# Patient Record
Sex: Male | Born: 1960 | Race: White | Hispanic: No | Marital: Single | Smoking: Never smoker
Health system: Southern US, Community
[De-identification: ages and names within clinical notes are randomized; demographics above are authoritative.]

## PROBLEM LIST (undated history)

## (undated) DIAGNOSIS — Z9981 Dependence on supplemental oxygen: Secondary | ICD-10-CM

## (undated) DIAGNOSIS — I10 Essential (primary) hypertension: Secondary | ICD-10-CM

## (undated) DIAGNOSIS — E119 Type 2 diabetes mellitus without complications: Secondary | ICD-10-CM

## (undated) DIAGNOSIS — L0390 Cellulitis, unspecified: Secondary | ICD-10-CM

## (undated) HISTORY — PX: CORONARY ANGIOPLASTY: SHX604

---

## 2003-01-08 ENCOUNTER — Emergency Department (HOSPITAL_COMMUNITY): Admission: EM | Admit: 2003-01-08 | Discharge: 2003-01-08 | Payer: Self-pay | Admitting: Emergency Medicine

## 2003-01-29 ENCOUNTER — Ambulatory Visit (HOSPITAL_COMMUNITY): Admission: RE | Admit: 2003-01-29 | Discharge: 2003-01-30 | Payer: Self-pay | Admitting: Cardiology

## 2003-02-20 ENCOUNTER — Emergency Department (HOSPITAL_COMMUNITY): Admission: EM | Admit: 2003-02-20 | Discharge: 2003-02-20 | Payer: Self-pay | Admitting: Emergency Medicine

## 2004-06-02 ENCOUNTER — Encounter: Admission: RE | Admit: 2004-06-02 | Discharge: 2004-06-23 | Payer: Self-pay | Admitting: Family Medicine

## 2004-11-16 ENCOUNTER — Ambulatory Visit: Payer: Self-pay | Admitting: Family Medicine

## 2005-01-02 ENCOUNTER — Ambulatory Visit: Payer: Self-pay | Admitting: Family Medicine

## 2005-01-06 ENCOUNTER — Ambulatory Visit: Payer: Self-pay | Admitting: Family Medicine

## 2005-02-13 ENCOUNTER — Ambulatory Visit: Payer: Self-pay | Admitting: Family Medicine

## 2005-03-13 ENCOUNTER — Ambulatory Visit: Payer: Self-pay | Admitting: Family Medicine

## 2005-03-31 ENCOUNTER — Ambulatory Visit: Payer: Self-pay | Admitting: Family Medicine

## 2005-04-03 ENCOUNTER — Ambulatory Visit: Payer: Self-pay | Admitting: Family Medicine

## 2005-04-20 ENCOUNTER — Ambulatory Visit: Payer: Self-pay | Admitting: Family Medicine

## 2005-04-25 ENCOUNTER — Ambulatory Visit: Payer: Self-pay | Admitting: Family Medicine

## 2005-05-10 ENCOUNTER — Ambulatory Visit: Payer: Self-pay | Admitting: Family Medicine

## 2005-06-14 ENCOUNTER — Ambulatory Visit: Payer: Self-pay | Admitting: Family Medicine

## 2005-07-17 ENCOUNTER — Ambulatory Visit: Payer: Self-pay | Admitting: Family Medicine

## 2005-08-10 ENCOUNTER — Ambulatory Visit: Payer: Self-pay | Admitting: Family Medicine

## 2005-09-04 ENCOUNTER — Ambulatory Visit: Payer: Self-pay | Admitting: Family Medicine

## 2005-10-09 ENCOUNTER — Ambulatory Visit: Payer: Self-pay | Admitting: Family Medicine

## 2005-10-10 ENCOUNTER — Ambulatory Visit: Payer: Self-pay | Admitting: Family Medicine

## 2005-10-13 ENCOUNTER — Ambulatory Visit: Payer: Self-pay | Admitting: Family Medicine

## 2005-10-19 ENCOUNTER — Ambulatory Visit: Payer: Self-pay | Admitting: Family Medicine

## 2005-10-30 ENCOUNTER — Ambulatory Visit: Payer: Self-pay | Admitting: Family Medicine

## 2005-11-03 ENCOUNTER — Ambulatory Visit: Payer: Self-pay | Admitting: Family Medicine

## 2005-11-28 ENCOUNTER — Ambulatory Visit: Payer: Self-pay | Admitting: Family Medicine

## 2005-12-25 ENCOUNTER — Ambulatory Visit: Payer: Self-pay | Admitting: Family Medicine

## 2006-01-22 ENCOUNTER — Ambulatory Visit: Payer: Self-pay | Admitting: Family Medicine

## 2006-01-29 ENCOUNTER — Ambulatory Visit: Payer: Self-pay | Admitting: Family Medicine

## 2006-02-06 ENCOUNTER — Emergency Department (HOSPITAL_COMMUNITY): Admission: EM | Admit: 2006-02-06 | Discharge: 2006-02-06 | Payer: Self-pay | Admitting: Emergency Medicine

## 2009-04-28 ENCOUNTER — Ambulatory Visit: Admission: RE | Admit: 2009-04-28 | Discharge: 2009-04-28 | Payer: Self-pay | Admitting: Family Medicine

## 2010-04-13 ENCOUNTER — Ambulatory Visit (HOSPITAL_COMMUNITY): Admission: RE | Admit: 2010-04-13 | Discharge: 2010-04-13 | Payer: Self-pay | Admitting: Orthopaedic Surgery

## 2011-01-06 NOTE — H&P (Signed)
[name], [name] NO.:  [id number]   MEDICAL RECORD NO.:  [id number]                  PATIENT TYPE:  OIB   LOCATION:                                       FACILITY:  [facility]   PHYSICIAN:  Dola Argyle, M.D.                  DATE OF BIRTH:  [birth year]   DATE OF ADMISSION:  01/29/2003  DATE OF DISCHARGE:                                HISTORY & PHYSICAL   [name] is a pleasant 50 year old gentleman.  He is markedly obese.  He  has hypertension and diabetes.  Most recently he has developed symptoms of  chest pain and pain across his shoulder blades and down his arms.  At first  there appeared to be a reflux component to this.  The patient received  antireflux medications, but he returned to work and with exertion he had  return of his sensation across his shoulders and into his arms.  With his  multiple risk factors, he is here for further evaluation today and it is  appropriate to proceed with aggressive evaluation.   ALLERGIES:  None known.   MEDICATIONS:  1. Altace 10 mg b.i.d.  2. Norvasc 10 mg daily.  3. Glipizide 5 mg daily.  4. Hyzaar 100/25 mg.  5. Tagamet.   OTHER MEDICAL PROBLEMS:  See the complete list below.   SOCIAL HISTORY:  The patient is single and works at [employer].  He  does not smoke.   FAMILY HISTORY:  Positive for coronary disease.   REVIEW OF SYSTEMS:  The patient has some GERD symptoms.  Otherwise his  review of systems is negative.   PHYSICAL EXAMINATION:  VITAL SIGNS:  Blood pressure is 140/60.  GENERAL:  The patient is markedly obese and weighs greater than  approximately 400 pounds.  CHEST:  Lungs are clear.  NECK:  Normal.  HEENT:  No marked abnormalities.  CARDIAC:  An S1 with an S2 but no clicks or significant murmurs.  ABDOMEN:  Benign.  EXTREMITIES:  He has no peripheral edema.   EKG shows a small Q-wave in lead III.  Labs done in mid-May showed a BUN of  40 and a creatinine of 1.4 and, in  fact, this was somewhat improved from a  prior study.   PROBLEMS:  1. Diabetes.  2. Hypertension.  3. Marked obesity  4. BUN of 42 and a creatinine of 1.4.  5. A Q-wave in lead III.  *6.  Chest pain that may well be ischemic.   The patient has a multitude of risk factors.  Noninvasive testing will not  help.  I have reviewed the situation with Dr. Albertine Patricia, and he will approach  the patient either from his groin or his arm in the catheterization lab on  01/29/03.  The patient is completely in favor.  Ultimately he needs to be on  a statin, and we will be  sure he gets aspirin tonight.                                                Dola Argyle, M.D.    Rockne Coons  D:  01/28/2003  T:  01/28/2003  Job:  [id number]   cc:   Lovie Macadamia on [address] [place],   Trellis Moment, MD  [place]  [place] [address]  Fax: [phone]

## 2011-01-06 NOTE — Cardiovascular Report (Signed)
[name], [name]                       ACCOUNT NO.:  [id number]   MEDICAL RECORD NO.:  [id number]                   PATIENT TYPE:  OIB   LOCATION:  2033                                 FACILITY:  [facility]   PHYSICIAN:  Ethelle Lyon, M.D.             DATE OF BIRTH:  01-May-1961   DATE OF PROCEDURE:  01/29/2003  DATE OF DISCHARGE:                              CARDIAC CATHETERIZATION   PROCEDURE:  1. Left heart catheterization.  2. Left ventriculography.  3. Selective coronary angiography.   INDICATIONS:  The patient is a 50 year old gentleman with hypertension,  diabetes mellitus, and morbid obesity who presents with chest discomfort  occurring both with and without exertion.  He is referred for diagnostic  angiography.   PROCEDURAL TECHNIQUE:  Informed consent was obtained.  Under 1% lidocaine  local anesthesia a 6-French sheath was placed in the right brachial artery  using the modified Seldinger technique and a micro puncture set.  Angiography of the right coronary system was performed using an AL2  catheter.  I was unable to engage the left coronary system using multiple  catheters.  Therefore, left brachial access was obtained using the modified  Seldinger technique under 1% lidocaine local anesthesia.  The left coronary  was then engaged selectively using a JL4 catheter.  Angiography was  performed by hand injection.  Finally, ventriculography was performed in the  RAO projection using a pigtail catheter and power injection.  The patient  tolerated the procedure well.  He was heparinized during the procedure.  Sheaths will be removed when ACT is less than 175 seconds.   COMPLICATIONS:  None.   FINDINGS:   LEFT VENTRICULOGRAPHY:  1. LV 106/7/15.  2. EF 65% without regional wall motion abnormality.  3. No aortic stenosis or mitral regurgitation.   CORONARY ANGIOGRAPHY:  1. Left main:  Angiographically normal.  2. LAD:  The LAD is a large vessel giving rise to  a single diagonal branch.     It is angiographically normal.  3. Ramus intermedius:  Large vessel which is angiographically normal.  4. Circumflex:  Large codominant vessel giving rise to a single obtuse     marginal and the PDA.  It is angiographically normal.  5. RCA:  Moderate sized codominant vessel which is angiographically normal.    IMPRESSION/RECOMMENDATIONS:  The patient has angiographically normal  coronary arteries.  I suspect noncardiac etiology to his chest discomfort.  Furthermore, his left ventricular systolic function is normal and there is  no aortic stenosis or mitral regurgitation.  Recommend continued primary  prevention and further investigation of his chest discomfort by Trellis Moment, MD.                                               Ethelle Lyon, M.D.  WED/MEDQ  D:  01/29/2003  T:  01/30/2003  Job:  [id number]   cc:   Dola Argyle, M.D.   Trellis Moment, MD  [address]  [place] [address]  Fax: [phone]

## 2011-01-06 NOTE — Discharge Summary (Signed)
NAMECLEMENTS, [name]                       ACCOUNT NO.:  [id number]   MEDICAL RECORD NO.:  [id number]                   PATIENT TYPE:  OIB   LOCATION:  2033                                 FACILITY:  [facility]   PHYSICIAN:  Dola Argyle, M.D.                  DATE OF BIRTH:  02-18-1961   DATE OF ADMISSION:  01/29/2003  DATE OF DISCHARGE:  01/30/2003                           DISCHARGE SUMMARY - REFERRING   DISCHARGE DIAGNOSES:  1. Noncardiac chest pain, etiology unclear.  2. Morbid obesity.  3. Hypertension.  4. Diabetes.   PROCEDURES PERFORMED THIS ADMISSION:  Cardiac catheterization by Dr. Sabino Snipes on January 29, 2003, revealing normal coronaries, EF 65%.   HOSPITAL COURSE:  Please see the admission history and physical by Dr.  Dola Argyle on January 29, 2003, for complete details.  Briefly, this 50-year-  old male was admitted on January 29, 2003, with chest pain.  He had recently  had a stress Cardiolite that showed mild inferior ischemia with an EF of  54%.  It was felt that we should proceed with cardiac catheterization.  He  went for cardiac catheterization later the same day; this was performed by  Dr. Albertine Patricia.  The results are noted above.  He tolerated the procedure well  and had no new complications.   On the morning of January 30, 2003, Dr. Harrington Challenger saw the patient, thought he was  stable enough for discharge to home; she suggested more interventional  therapy for possible GERD contributing to his symptoms.  The patient will  also need a fasting lipid panel drawn with his primary care physician.  Aggressive lipid therapy will need to be initiated based upon the ATP-3  guidelines.  Consideration should also be made for checking a homocystine  level in this gentleman.  The patient was counseled about diet and exercise  and weight loss.  He needs more aggressive monitoring of his diabetes as  well.   LABORATORY DATA:  White count 14,700, hemoglobin 12.9, hematocrit 38.2,  platelet count 214,000.  INR 1.  Sodium 136, potassium 4.5, chloride 103,  CO2 of 26, glucose 177, BUN 22, creatinine 1.1, calcium 9.2.  Admission  chest x-ray, normal.   DISCHARGE MEDICATIONS:  1. Protonix 40 mg daily.  2. Altace 10 mg twice daily.  3. Norvasc 10 mg daily.  4. Glipizide 5 mg daily.  5. Hyzaar 100/25 mg daily.   PAIN MANAGEMENT:  Tylenol as needed.   ACTIVITY:  No driving, heavy lifting, exertional work, or sex for two days.   DIET:  Low-fat, low-sodium.   WOUND CARE:  The patient should call the office for swelling, bleeding, or  bruising.   FOLLOW UP:  The patient should see Dr. Mallie Mussel in the next one to two weeks;  he is to call for an appointment.  He will need a fasting lipid panel drawn  by his primary care physician and  homocystine level with aggressive therapy  of his lipids, if warranted.     Richardson Dopp, P.A.                        Dola Argyle, M.D.    SW/MEDQ  D:  01/30/2003  T:  01/30/2003  Job:  [id number]   cc:   Trellis Moment, MD  [place]  [place] [address]  Fax: [phone]   Dola Argyle, M.D.

## 2012-04-11 DIAGNOSIS — M1990 Unspecified osteoarthritis, unspecified site: Secondary | ICD-10-CM | POA: Insufficient documentation

## 2012-09-09 DIAGNOSIS — Z23 Encounter for immunization: Secondary | ICD-10-CM | POA: Insufficient documentation

## 2013-12-22 DIAGNOSIS — R632 Polyphagia: Secondary | ICD-10-CM | POA: Insufficient documentation

## 2014-08-26 DIAGNOSIS — B372 Candidiasis of skin and nail: Secondary | ICD-10-CM | POA: Insufficient documentation

## 2014-08-26 DIAGNOSIS — N183 Chronic kidney disease, stage 3 unspecified: Secondary | ICD-10-CM | POA: Insufficient documentation

## 2014-08-26 DIAGNOSIS — Z794 Long term (current) use of insulin: Secondary | ICD-10-CM | POA: Insufficient documentation

## 2014-12-11 ENCOUNTER — Other Ambulatory Visit (HOSPITAL_COMMUNITY): Payer: Self-pay | Admitting: Nephrology

## 2014-12-11 DIAGNOSIS — N183 Chronic kidney disease, stage 3 unspecified: Secondary | ICD-10-CM

## 2014-12-24 ENCOUNTER — Ambulatory Visit (HOSPITAL_COMMUNITY)
Admission: RE | Admit: 2014-12-24 | Discharge: 2014-12-24 | Disposition: A | Discharge status: Home / Self Care | Payer: Medicare HMO | Source: Ambulatory Visit | Attending: Nephrology | Admitting: Nephrology

## 2014-12-24 DIAGNOSIS — N183 Chronic kidney disease, stage 3 unspecified: Secondary | ICD-10-CM

## 2015-01-28 ENCOUNTER — Encounter (INDEPENDENT_AMBULATORY_CARE_PROVIDER_SITE_OTHER): Payer: Self-pay | Admitting: *Deleted

## 2015-02-01 DIAGNOSIS — K219 Gastro-esophageal reflux disease without esophagitis: Secondary | ICD-10-CM | POA: Insufficient documentation

## 2015-02-01 DIAGNOSIS — E785 Hyperlipidemia, unspecified: Secondary | ICD-10-CM | POA: Insufficient documentation

## 2015-02-01 DIAGNOSIS — E1169 Type 2 diabetes mellitus with other specified complication: Secondary | ICD-10-CM | POA: Insufficient documentation

## 2015-02-01 DIAGNOSIS — D510 Vitamin B12 deficiency anemia due to intrinsic factor deficiency: Secondary | ICD-10-CM | POA: Insufficient documentation

## 2015-02-26 ENCOUNTER — Encounter (HOSPITAL_BASED_OUTPATIENT_CLINIC_OR_DEPARTMENT_OTHER): Payer: Medicare HMO | Attending: Internal Medicine

## 2015-02-26 DIAGNOSIS — I509 Heart failure, unspecified: Secondary | ICD-10-CM | POA: Insufficient documentation

## 2015-02-26 DIAGNOSIS — Z6841 Body Mass Index (BMI) 40.0 and over, adult: Secondary | ICD-10-CM | POA: Diagnosis not present

## 2015-02-26 DIAGNOSIS — L97311 Non-pressure chronic ulcer of right ankle limited to breakdown of skin: Secondary | ICD-10-CM | POA: Insufficient documentation

## 2015-02-26 DIAGNOSIS — L98491 Non-pressure chronic ulcer of skin of other sites limited to breakdown of skin: Secondary | ICD-10-CM | POA: Diagnosis not present

## 2015-02-26 DIAGNOSIS — E039 Hypothyroidism, unspecified: Secondary | ICD-10-CM | POA: Diagnosis not present

## 2015-02-26 DIAGNOSIS — Z794 Long term (current) use of insulin: Secondary | ICD-10-CM | POA: Insufficient documentation

## 2015-02-26 DIAGNOSIS — M1990 Unspecified osteoarthritis, unspecified site: Secondary | ICD-10-CM | POA: Insufficient documentation

## 2015-02-26 DIAGNOSIS — I129 Hypertensive chronic kidney disease with stage 1 through stage 4 chronic kidney disease, or unspecified chronic kidney disease: Secondary | ICD-10-CM | POA: Diagnosis not present

## 2015-02-26 DIAGNOSIS — E1122 Type 2 diabetes mellitus with diabetic chronic kidney disease: Secondary | ICD-10-CM | POA: Insufficient documentation

## 2015-02-26 DIAGNOSIS — E11622 Type 2 diabetes mellitus with other skin ulcer: Secondary | ICD-10-CM | POA: Insufficient documentation

## 2015-02-26 DIAGNOSIS — Z833 Family history of diabetes mellitus: Secondary | ICD-10-CM | POA: Insufficient documentation

## 2015-02-26 DIAGNOSIS — N189 Chronic kidney disease, unspecified: Secondary | ICD-10-CM | POA: Diagnosis not present

## 2015-02-26 DIAGNOSIS — L97111 Non-pressure chronic ulcer of right thigh limited to breakdown of skin: Secondary | ICD-10-CM | POA: Diagnosis not present

## 2015-02-26 DIAGNOSIS — Z87891 Personal history of nicotine dependence: Secondary | ICD-10-CM | POA: Insufficient documentation

## 2015-02-26 DIAGNOSIS — I499 Cardiac arrhythmia, unspecified: Secondary | ICD-10-CM | POA: Diagnosis not present

## 2015-02-26 DIAGNOSIS — G4730 Sleep apnea, unspecified: Secondary | ICD-10-CM | POA: Diagnosis not present

## 2015-02-26 DIAGNOSIS — M109 Gout, unspecified: Secondary | ICD-10-CM | POA: Diagnosis not present

## 2015-03-05 DIAGNOSIS — L98491 Non-pressure chronic ulcer of skin of other sites limited to breakdown of skin: Secondary | ICD-10-CM | POA: Diagnosis not present

## 2015-03-05 DIAGNOSIS — L97111 Non-pressure chronic ulcer of right thigh limited to breakdown of skin: Secondary | ICD-10-CM | POA: Diagnosis not present

## 2015-03-05 DIAGNOSIS — L97311 Non-pressure chronic ulcer of right ankle limited to breakdown of skin: Secondary | ICD-10-CM | POA: Diagnosis not present

## 2015-03-05 DIAGNOSIS — E11622 Type 2 diabetes mellitus with other skin ulcer: Secondary | ICD-10-CM | POA: Diagnosis not present

## 2015-03-08 ENCOUNTER — Ambulatory Visit (INDEPENDENT_AMBULATORY_CARE_PROVIDER_SITE_OTHER): Payer: Medicare HMO | Admitting: Internal Medicine

## 2015-03-11 ENCOUNTER — Encounter (INDEPENDENT_AMBULATORY_CARE_PROVIDER_SITE_OTHER): Payer: Self-pay | Admitting: *Deleted

## 2015-03-12 DIAGNOSIS — L97111 Non-pressure chronic ulcer of right thigh limited to breakdown of skin: Secondary | ICD-10-CM | POA: Diagnosis not present

## 2015-03-12 DIAGNOSIS — L98491 Non-pressure chronic ulcer of skin of other sites limited to breakdown of skin: Secondary | ICD-10-CM | POA: Diagnosis not present

## 2015-03-12 DIAGNOSIS — L97311 Non-pressure chronic ulcer of right ankle limited to breakdown of skin: Secondary | ICD-10-CM | POA: Diagnosis not present

## 2015-03-12 DIAGNOSIS — E11622 Type 2 diabetes mellitus with other skin ulcer: Secondary | ICD-10-CM | POA: Diagnosis not present

## 2015-03-19 DIAGNOSIS — E11622 Type 2 diabetes mellitus with other skin ulcer: Secondary | ICD-10-CM | POA: Diagnosis not present

## 2015-03-19 DIAGNOSIS — L97311 Non-pressure chronic ulcer of right ankle limited to breakdown of skin: Secondary | ICD-10-CM | POA: Diagnosis not present

## 2015-03-19 DIAGNOSIS — L97111 Non-pressure chronic ulcer of right thigh limited to breakdown of skin: Secondary | ICD-10-CM | POA: Diagnosis not present

## 2015-03-19 DIAGNOSIS — L98491 Non-pressure chronic ulcer of skin of other sites limited to breakdown of skin: Secondary | ICD-10-CM | POA: Diagnosis not present

## 2015-03-26 ENCOUNTER — Encounter (HOSPITAL_BASED_OUTPATIENT_CLINIC_OR_DEPARTMENT_OTHER): Payer: Medicare HMO | Attending: Internal Medicine

## 2015-03-26 DIAGNOSIS — M1990 Unspecified osteoarthritis, unspecified site: Secondary | ICD-10-CM | POA: Insufficient documentation

## 2015-03-26 DIAGNOSIS — L97111 Non-pressure chronic ulcer of right thigh limited to breakdown of skin: Secondary | ICD-10-CM | POA: Insufficient documentation

## 2015-03-26 DIAGNOSIS — M109 Gout, unspecified: Secondary | ICD-10-CM | POA: Diagnosis not present

## 2015-03-26 DIAGNOSIS — G4730 Sleep apnea, unspecified: Secondary | ICD-10-CM | POA: Insufficient documentation

## 2015-03-26 DIAGNOSIS — Z6841 Body Mass Index (BMI) 40.0 and over, adult: Secondary | ICD-10-CM | POA: Insufficient documentation

## 2015-03-26 DIAGNOSIS — L97311 Non-pressure chronic ulcer of right ankle limited to breakdown of skin: Secondary | ICD-10-CM | POA: Diagnosis not present

## 2015-03-26 DIAGNOSIS — Z8614 Personal history of Methicillin resistant Staphylococcus aureus infection: Secondary | ICD-10-CM | POA: Diagnosis not present

## 2015-03-26 DIAGNOSIS — E1140 Type 2 diabetes mellitus with diabetic neuropathy, unspecified: Secondary | ICD-10-CM | POA: Diagnosis not present

## 2015-03-26 DIAGNOSIS — L98491 Non-pressure chronic ulcer of skin of other sites limited to breakdown of skin: Secondary | ICD-10-CM | POA: Diagnosis not present

## 2015-03-26 DIAGNOSIS — E11622 Type 2 diabetes mellitus with other skin ulcer: Secondary | ICD-10-CM | POA: Diagnosis not present

## 2015-03-26 DIAGNOSIS — I509 Heart failure, unspecified: Secondary | ICD-10-CM | POA: Diagnosis not present

## 2015-03-26 DIAGNOSIS — I499 Cardiac arrhythmia, unspecified: Secondary | ICD-10-CM | POA: Diagnosis not present

## 2015-04-02 DIAGNOSIS — L97111 Non-pressure chronic ulcer of right thigh limited to breakdown of skin: Secondary | ICD-10-CM | POA: Diagnosis not present

## 2015-04-02 DIAGNOSIS — L98491 Non-pressure chronic ulcer of skin of other sites limited to breakdown of skin: Secondary | ICD-10-CM | POA: Diagnosis not present

## 2015-04-02 DIAGNOSIS — L97311 Non-pressure chronic ulcer of right ankle limited to breakdown of skin: Secondary | ICD-10-CM | POA: Diagnosis not present

## 2015-04-02 DIAGNOSIS — E11622 Type 2 diabetes mellitus with other skin ulcer: Secondary | ICD-10-CM | POA: Diagnosis not present

## 2015-04-06 ENCOUNTER — Ambulatory Visit (INDEPENDENT_AMBULATORY_CARE_PROVIDER_SITE_OTHER): Payer: Medicare HMO | Admitting: Internal Medicine

## 2015-04-09 DIAGNOSIS — E11622 Type 2 diabetes mellitus with other skin ulcer: Secondary | ICD-10-CM | POA: Diagnosis not present

## 2015-04-09 DIAGNOSIS — L97111 Non-pressure chronic ulcer of right thigh limited to breakdown of skin: Secondary | ICD-10-CM | POA: Diagnosis not present

## 2015-04-09 DIAGNOSIS — L98491 Non-pressure chronic ulcer of skin of other sites limited to breakdown of skin: Secondary | ICD-10-CM | POA: Diagnosis not present

## 2015-04-09 DIAGNOSIS — L97311 Non-pressure chronic ulcer of right ankle limited to breakdown of skin: Secondary | ICD-10-CM | POA: Diagnosis not present

## 2015-04-12 ENCOUNTER — Ambulatory Visit (INDEPENDENT_AMBULATORY_CARE_PROVIDER_SITE_OTHER): Payer: Medicare HMO | Admitting: Internal Medicine

## 2015-04-23 ENCOUNTER — Encounter (HOSPITAL_BASED_OUTPATIENT_CLINIC_OR_DEPARTMENT_OTHER): Payer: Medicare HMO | Attending: Internal Medicine

## 2015-04-23 DIAGNOSIS — M109 Gout, unspecified: Secondary | ICD-10-CM | POA: Diagnosis not present

## 2015-04-23 DIAGNOSIS — I509 Heart failure, unspecified: Secondary | ICD-10-CM | POA: Insufficient documentation

## 2015-04-23 DIAGNOSIS — E1140 Type 2 diabetes mellitus with diabetic neuropathy, unspecified: Secondary | ICD-10-CM | POA: Diagnosis not present

## 2015-04-23 DIAGNOSIS — I10 Essential (primary) hypertension: Secondary | ICD-10-CM | POA: Diagnosis not present

## 2015-04-23 DIAGNOSIS — L97111 Non-pressure chronic ulcer of right thigh limited to breakdown of skin: Secondary | ICD-10-CM | POA: Diagnosis not present

## 2015-04-23 DIAGNOSIS — L98491 Non-pressure chronic ulcer of skin of other sites limited to breakdown of skin: Secondary | ICD-10-CM | POA: Insufficient documentation

## 2015-04-23 DIAGNOSIS — I499 Cardiac arrhythmia, unspecified: Secondary | ICD-10-CM | POA: Diagnosis not present

## 2015-04-23 DIAGNOSIS — G4730 Sleep apnea, unspecified: Secondary | ICD-10-CM | POA: Insufficient documentation

## 2015-04-23 DIAGNOSIS — L97311 Non-pressure chronic ulcer of right ankle limited to breakdown of skin: Secondary | ICD-10-CM | POA: Diagnosis not present

## 2015-04-23 DIAGNOSIS — E11622 Type 2 diabetes mellitus with other skin ulcer: Secondary | ICD-10-CM | POA: Diagnosis present

## 2015-04-23 DIAGNOSIS — M1990 Unspecified osteoarthritis, unspecified site: Secondary | ICD-10-CM | POA: Diagnosis not present

## 2015-05-07 DIAGNOSIS — L98491 Non-pressure chronic ulcer of skin of other sites limited to breakdown of skin: Secondary | ICD-10-CM | POA: Diagnosis not present

## 2015-05-07 DIAGNOSIS — L97311 Non-pressure chronic ulcer of right ankle limited to breakdown of skin: Secondary | ICD-10-CM | POA: Diagnosis not present

## 2015-05-07 DIAGNOSIS — L97111 Non-pressure chronic ulcer of right thigh limited to breakdown of skin: Secondary | ICD-10-CM | POA: Diagnosis not present

## 2015-05-07 DIAGNOSIS — E11622 Type 2 diabetes mellitus with other skin ulcer: Secondary | ICD-10-CM | POA: Diagnosis not present

## 2015-05-28 ENCOUNTER — Encounter (HOSPITAL_BASED_OUTPATIENT_CLINIC_OR_DEPARTMENT_OTHER): Payer: Medicare HMO | Attending: Internal Medicine

## 2015-05-28 DIAGNOSIS — I110 Hypertensive heart disease with heart failure: Secondary | ICD-10-CM | POA: Insufficient documentation

## 2015-05-28 DIAGNOSIS — M109 Gout, unspecified: Secondary | ICD-10-CM | POA: Insufficient documentation

## 2015-05-28 DIAGNOSIS — M1990 Unspecified osteoarthritis, unspecified site: Secondary | ICD-10-CM | POA: Diagnosis not present

## 2015-05-28 DIAGNOSIS — L98491 Non-pressure chronic ulcer of skin of other sites limited to breakdown of skin: Secondary | ICD-10-CM | POA: Insufficient documentation

## 2015-05-28 DIAGNOSIS — I509 Heart failure, unspecified: Secondary | ICD-10-CM | POA: Insufficient documentation

## 2015-05-28 DIAGNOSIS — F418 Other specified anxiety disorders: Secondary | ICD-10-CM | POA: Insufficient documentation

## 2015-05-28 DIAGNOSIS — E11622 Type 2 diabetes mellitus with other skin ulcer: Secondary | ICD-10-CM | POA: Diagnosis not present

## 2015-05-28 DIAGNOSIS — G4730 Sleep apnea, unspecified: Secondary | ICD-10-CM | POA: Insufficient documentation

## 2015-05-28 DIAGNOSIS — L97311 Non-pressure chronic ulcer of right ankle limited to breakdown of skin: Secondary | ICD-10-CM | POA: Diagnosis not present

## 2015-05-28 DIAGNOSIS — L97111 Non-pressure chronic ulcer of right thigh limited to breakdown of skin: Secondary | ICD-10-CM | POA: Insufficient documentation

## 2015-05-28 DIAGNOSIS — Z6841 Body Mass Index (BMI) 40.0 and over, adult: Secondary | ICD-10-CM | POA: Insufficient documentation

## 2015-06-25 ENCOUNTER — Encounter (HOSPITAL_BASED_OUTPATIENT_CLINIC_OR_DEPARTMENT_OTHER): Payer: Medicare HMO | Attending: Internal Medicine

## 2015-08-24 DIAGNOSIS — E559 Vitamin D deficiency, unspecified: Secondary | ICD-10-CM | POA: Insufficient documentation

## 2015-08-24 DIAGNOSIS — M793 Panniculitis, unspecified: Secondary | ICD-10-CM | POA: Insufficient documentation

## 2015-09-14 ENCOUNTER — Ambulatory Visit (HOSPITAL_BASED_OUTPATIENT_CLINIC_OR_DEPARTMENT_OTHER): Payer: Medicare HMO

## 2015-09-30 ENCOUNTER — Encounter (HOSPITAL_BASED_OUTPATIENT_CLINIC_OR_DEPARTMENT_OTHER): Payer: Medicare HMO | Attending: Internal Medicine

## 2015-09-30 DIAGNOSIS — L97811 Non-pressure chronic ulcer of other part of right lower leg limited to breakdown of skin: Secondary | ICD-10-CM | POA: Insufficient documentation

## 2015-09-30 DIAGNOSIS — Z6841 Body Mass Index (BMI) 40.0 and over, adult: Secondary | ICD-10-CM | POA: Diagnosis not present

## 2015-09-30 DIAGNOSIS — L97111 Non-pressure chronic ulcer of right thigh limited to breakdown of skin: Secondary | ICD-10-CM | POA: Insufficient documentation

## 2015-09-30 DIAGNOSIS — G4730 Sleep apnea, unspecified: Secondary | ICD-10-CM | POA: Diagnosis not present

## 2015-09-30 DIAGNOSIS — M109 Gout, unspecified: Secondary | ICD-10-CM | POA: Diagnosis not present

## 2015-09-30 DIAGNOSIS — E1140 Type 2 diabetes mellitus with diabetic neuropathy, unspecified: Secondary | ICD-10-CM | POA: Insufficient documentation

## 2015-09-30 DIAGNOSIS — I509 Heart failure, unspecified: Secondary | ICD-10-CM | POA: Diagnosis not present

## 2015-09-30 DIAGNOSIS — X58XXXA Exposure to other specified factors, initial encounter: Secondary | ICD-10-CM | POA: Insufficient documentation

## 2015-09-30 DIAGNOSIS — I87331 Chronic venous hypertension (idiopathic) with ulcer and inflammation of right lower extremity: Secondary | ICD-10-CM | POA: Insufficient documentation

## 2015-09-30 DIAGNOSIS — I110 Hypertensive heart disease with heart failure: Secondary | ICD-10-CM | POA: Insufficient documentation

## 2015-09-30 DIAGNOSIS — S31103A Unspecified open wound of abdominal wall, right lower quadrant without penetration into peritoneal cavity, initial encounter: Secondary | ICD-10-CM | POA: Insufficient documentation

## 2015-09-30 DIAGNOSIS — M1990 Unspecified osteoarthritis, unspecified site: Secondary | ICD-10-CM | POA: Diagnosis not present

## 2015-09-30 DIAGNOSIS — I890 Lymphedema, not elsewhere classified: Secondary | ICD-10-CM | POA: Insufficient documentation

## 2015-10-06 ENCOUNTER — Ambulatory Visit (INDEPENDENT_AMBULATORY_CARE_PROVIDER_SITE_OTHER): Payer: Medicare HMO | Admitting: Orthopaedic Surgery

## 2015-10-06 ENCOUNTER — Encounter: Payer: Self-pay | Admitting: Orthopaedic Surgery

## 2015-10-06 DIAGNOSIS — M25561 Pain in right knee: Secondary | ICD-10-CM | POA: Diagnosis not present

## 2015-10-06 DIAGNOSIS — M25562 Pain in left knee: Secondary | ICD-10-CM

## 2015-10-06 MED ORDER — OXYCODONE HCL 15 MG PO TABS: ORAL_TABLET | ORAL | Status: DC

## 2015-10-06 NOTE — Patient Instructions (Signed)
Call if any problems

## 2015-10-06 NOTE — Progress Notes (Signed)
Chief complaint is my knees hurt.  He is here for injections of both knees.  He has no new trauma.  He is on supplemental oxygen.  The patient request injection, verbal consent was obtained.  The right knee was prepped appropriately after time out was performed.   Sterile technique was observed and injection of 1 cc of Depo-Medrol 40 mg with several cc's of plain xylocaine. Anesthesia was provided by ethyl chloride and a 20-gauge needle was used to inject the knee area. The injection was tolerated well.  A band aid dressing was applied.  The patient was advised to apply ice later today and tomorrow to the injection sight as needed.  The patient request injection, verbal consent was obtained.  The left knee was prepped appropriately after time out was performed.   Sterile technique was observed and injection of 1 cc of Depo-Medrol 40 mg with several cc's of plain xylocaine. Anesthesia was provided by ethyl chloride and a 20-gauge needle was used to inject the knee area. The injection was tolerated well.  A band aid dressing was applied.  The patient was advised to apply ice later today and tomorrow to the injection sight as needed.  His pain medicine was refilled.  I will see him in one month.  Call if any problem.

## 2015-10-07 DIAGNOSIS — I87331 Chronic venous hypertension (idiopathic) with ulcer and inflammation of right lower extremity: Secondary | ICD-10-CM | POA: Diagnosis not present

## 2015-10-14 DIAGNOSIS — I87331 Chronic venous hypertension (idiopathic) with ulcer and inflammation of right lower extremity: Secondary | ICD-10-CM | POA: Diagnosis not present

## 2015-10-21 ENCOUNTER — Encounter (HOSPITAL_BASED_OUTPATIENT_CLINIC_OR_DEPARTMENT_OTHER): Payer: Medicare HMO | Attending: Internal Medicine

## 2015-10-21 DIAGNOSIS — E1140 Type 2 diabetes mellitus with diabetic neuropathy, unspecified: Secondary | ICD-10-CM | POA: Insufficient documentation

## 2015-10-21 DIAGNOSIS — S31103A Unspecified open wound of abdominal wall, right lower quadrant without penetration into peritoneal cavity, initial encounter: Secondary | ICD-10-CM | POA: Diagnosis not present

## 2015-10-21 DIAGNOSIS — I110 Hypertensive heart disease with heart failure: Secondary | ICD-10-CM | POA: Insufficient documentation

## 2015-10-21 DIAGNOSIS — M1990 Unspecified osteoarthritis, unspecified site: Secondary | ICD-10-CM | POA: Insufficient documentation

## 2015-10-21 DIAGNOSIS — L97812 Non-pressure chronic ulcer of other part of right lower leg with fat layer exposed: Secondary | ICD-10-CM | POA: Insufficient documentation

## 2015-10-21 DIAGNOSIS — L97111 Non-pressure chronic ulcer of right thigh limited to breakdown of skin: Secondary | ICD-10-CM | POA: Insufficient documentation

## 2015-10-21 DIAGNOSIS — I509 Heart failure, unspecified: Secondary | ICD-10-CM | POA: Diagnosis not present

## 2015-10-21 DIAGNOSIS — X58XXXA Exposure to other specified factors, initial encounter: Secondary | ICD-10-CM | POA: Diagnosis not present

## 2015-10-21 DIAGNOSIS — G4730 Sleep apnea, unspecified: Secondary | ICD-10-CM | POA: Diagnosis not present

## 2015-10-21 DIAGNOSIS — I87331 Chronic venous hypertension (idiopathic) with ulcer and inflammation of right lower extremity: Secondary | ICD-10-CM | POA: Diagnosis not present

## 2015-10-21 DIAGNOSIS — I890 Lymphedema, not elsewhere classified: Secondary | ICD-10-CM | POA: Insufficient documentation

## 2015-10-21 DIAGNOSIS — Z6841 Body Mass Index (BMI) 40.0 and over, adult: Secondary | ICD-10-CM | POA: Diagnosis not present

## 2015-10-21 DIAGNOSIS — L98491 Non-pressure chronic ulcer of skin of other sites limited to breakdown of skin: Secondary | ICD-10-CM | POA: Insufficient documentation

## 2015-10-27 ENCOUNTER — Telehealth: Payer: Self-pay | Admitting: Orthopaedic Surgery

## 2015-10-27 MED ORDER — OXYCODONE HCL 15 MG PO TABS: ORAL_TABLET | ORAL | Status: DC

## 2015-10-27 NOTE — Telephone Encounter (Signed)
Patient called and requested a refill on Oxycodone 15 mgs.  Qty 160.  Patient states it is due to be refilled on 10-30-15.

## 2015-10-27 NOTE — Telephone Encounter (Signed)
Rx done. 

## 2015-11-10 ENCOUNTER — Ambulatory Visit (INDEPENDENT_AMBULATORY_CARE_PROVIDER_SITE_OTHER): Payer: Medicare HMO | Admitting: Orthopaedic Surgery

## 2015-11-10 DIAGNOSIS — M25561 Pain in right knee: Secondary | ICD-10-CM | POA: Diagnosis not present

## 2015-11-10 DIAGNOSIS — M25562 Pain in left knee: Secondary | ICD-10-CM

## 2015-11-10 MED ORDER — OXYCODONE HCL 15 MG PO TABS: ORAL_TABLET | ORAL | Status: DC

## 2015-11-10 NOTE — Progress Notes (Signed)
CC: Both of my knees are hurting. I would like an injection in both knees.  The patient has had chronic pain and tenderness of both knees for some time.  Injections help.  There is no locking or giving way of the knee.  There is no new trauma. There is no redness or signs of infections.  The knees have a mild effusion and some crepitus.  There is no redness or signs of recent trauma.  Impression:  Chronic pain of the both knees  Return:  One month.  While here he became very short of breath.  He has portable oxygen.  He said he could not walk back to the car.  We used the room table as a stretcher as it has retractable wheels on the table.  We wheeled him out to the lobby.  He was able to get in his car.  PROCEDURE NOTE:  The patient request injection, verbal consent was obtained.  The bilateral knees were prepped appropriately after time out was performed.   Sterile technique was observed and injection of 1 cc of Depo-Medrol 40 mg with several cc's of plain xylocaine. Anesthesia was provided by ethyl chloride and a 20-gauge needle was used to inject the knee area. The injection was tolerated well.  Both knees were injected similarly.  A band aid dressing was applied.  The patient was advised to apply ice later today and tomorrow to the injection sight as needed.

## 2015-11-22 DIAGNOSIS — D509 Iron deficiency anemia, unspecified: Secondary | ICD-10-CM | POA: Insufficient documentation

## 2015-11-22 DIAGNOSIS — E039 Hypothyroidism, unspecified: Secondary | ICD-10-CM | POA: Insufficient documentation

## 2015-11-25 ENCOUNTER — Encounter (HOSPITAL_BASED_OUTPATIENT_CLINIC_OR_DEPARTMENT_OTHER): Payer: Medicare HMO

## 2015-12-09 ENCOUNTER — Ambulatory Visit (INDEPENDENT_AMBULATORY_CARE_PROVIDER_SITE_OTHER): Payer: Medicare HMO | Admitting: Orthopaedic Surgery

## 2015-12-09 ENCOUNTER — Encounter (HOSPITAL_BASED_OUTPATIENT_CLINIC_OR_DEPARTMENT_OTHER): Payer: Medicare HMO | Attending: Internal Medicine

## 2015-12-09 DIAGNOSIS — M25562 Pain in left knee: Secondary | ICD-10-CM | POA: Diagnosis not present

## 2015-12-09 DIAGNOSIS — M25561 Pain in right knee: Secondary | ICD-10-CM

## 2015-12-09 MED ORDER — OXYCODONE HCL 15 MG PO TABS: ORAL_TABLET | ORAL | Status: DC

## 2015-12-09 NOTE — Progress Notes (Signed)
Patient ID: [name], male   DOB: 11/08/1960, 55 y.o.   MRN: [id number]  CC: Both of my knees are hurting. I would like an injection in both knees.  The patient has had chronic pain and tenderness of both knees for some time.  Injections help.  There is no locking or giving way of the knee.  There is no new trauma. There is no redness or signs of infections.  The knees have a mild effusion and some crepitus.  There is no redness or signs of recent trauma.  Impression:  Chronic pain of the both knees  Return:  One month  PROCEDURE NOTE:  The patient requests injections of the left knee , verbal consent was obtained.  The left knee was prepped appropriately after time out was performed.   Sterile technique was observed and injection of 1 cc of Depo-Medrol 40 mg with several cc's of plain xylocaine. Anesthesia was provided by ethyl chloride and a 20-gauge needle was used to inject the knee area. The injection was tolerated well.  A band aid dressing was applied.  The patient was advised to apply ice later today and tomorrow to the injection sight as needed.  PROCEDURE NOTE:  The patient requests injections of the right knee , verbal consent was obtained.  The right knee was prepped appropriately after time out was performed.   Sterile technique was observed and injection of 1 cc of Depo-Medrol 40 mg with several cc's of plain xylocaine. Anesthesia was provided by ethyl chloride and a 20-gauge needle was used to inject the knee area. The injection was tolerated well.  A band aid dressing was applied.  The patient was advised to apply ice later today and tomorrow to the injection sight as needed.

## 2015-12-30 ENCOUNTER — Encounter (HOSPITAL_BASED_OUTPATIENT_CLINIC_OR_DEPARTMENT_OTHER): Payer: Medicare HMO

## 2016-01-06 ENCOUNTER — Ambulatory Visit (INDEPENDENT_AMBULATORY_CARE_PROVIDER_SITE_OTHER): Payer: Medicare HMO | Admitting: Orthopaedic Surgery

## 2016-01-06 ENCOUNTER — Encounter: Payer: Self-pay | Admitting: Orthopaedic Surgery

## 2016-01-06 DIAGNOSIS — M25561 Pain in right knee: Secondary | ICD-10-CM | POA: Diagnosis not present

## 2016-01-06 DIAGNOSIS — M25562 Pain in left knee: Secondary | ICD-10-CM | POA: Diagnosis not present

## 2016-01-06 MED ORDER — OXYCODONE HCL 15 MG PO TABS: ORAL_TABLET | ORAL | Status: DC

## 2016-01-06 NOTE — Progress Notes (Signed)
CC: Both of my knees are hurting. I would like an injection in both knees.  The patient has had chronic pain and tenderness of both knees for some time.  Injections help.  There is no locking or giving way of the knee.  There is no new trauma. There is no redness or signs of infections.  The knees have a mild effusion and some crepitus.  There is no redness or signs of recent trauma.  Impression:  Chronic pain of the both knees  Return:  One month  PROCEDURE NOTE:  The patient requests injections of the left knee , verbal consent was obtained.  The left knee was prepped appropriately after time out was performed.   Sterile technique was observed and injection of 1 cc of Depo-Medrol 40 mg with several cc's of plain xylocaine. Anesthesia was provided by ethyl chloride and a 20-gauge needle was used to inject the knee area. The injection was tolerated well.  A band aid dressing was applied.  The patient was advised to apply ice later today and tomorrow to the injection sight as needed.  PROCEDURE NOTE:  The patient requests injections of the right knee , verbal consent was obtained.  The right knee was prepped appropriately after time out was performed.   Sterile technique was observed and injection of 1 cc of Depo-Medrol 40 mg with several cc's of plain xylocaine. Anesthesia was provided by ethyl chloride and a 20-gauge needle was used to inject the knee area. The injection was tolerated well.  A band aid dressing was applied.  The patient was advised to apply ice later today and tomorrow to the injection sight as needed.

## 2016-01-08 ENCOUNTER — Telehealth: Payer: Self-pay | Admitting: Orthopaedic Surgery

## 2016-01-10 NOTE — Telephone Encounter (Signed)
Rx done. 

## 2016-02-03 ENCOUNTER — Ambulatory Visit (INDEPENDENT_AMBULATORY_CARE_PROVIDER_SITE_OTHER): Payer: Medicare HMO | Admitting: Orthopaedic Surgery

## 2016-02-03 ENCOUNTER — Encounter: Payer: Self-pay | Admitting: Orthopaedic Surgery

## 2016-02-03 DIAGNOSIS — M25562 Pain in left knee: Secondary | ICD-10-CM

## 2016-02-03 DIAGNOSIS — M25561 Pain in right knee: Secondary | ICD-10-CM | POA: Diagnosis not present

## 2016-02-03 MED ORDER — OXYCODONE HCL 15 MG PO TABS: ORAL_TABLET | ORAL | Status: DC

## 2016-02-03 NOTE — Progress Notes (Signed)
CC: Both of my knees are hurting. I would like an injection in both knees.  The patient has had chronic pain and tenderness of both knees for some time.  Injections help.  There is no locking or giving way of the knee.  There is no new trauma. There is no redness or signs of infections.  The knees have a mild effusion and some crepitus.  There is no redness or signs of recent trauma.  Impression:  Chronic pain of the both knees  Return:  One month  PROCEDURE NOTE:  The patient requests injections of the left knee , verbal consent was obtained.  The left knee was prepped appropriately after time out was performed.   Sterile technique was observed and injection of 1 cc of Depo-Medrol 40 mg with several cc's of plain xylocaine. Anesthesia was provided by ethyl chloride and a 20-gauge needle was used to inject the knee area. The injection was tolerated well.  A band aid dressing was applied.  The patient was advised to apply ice later today and tomorrow to the injection sight as needed.  PROCEDURE NOTE:  The patient requests injections of the right knee , verbal consent was obtained.  The right knee was prepped appropriately after time out was performed.   Sterile technique was observed and injection of 1 cc of Depo-Medrol 40 mg with several cc's of plain xylocaine. Anesthesia was provided by ethyl chloride and a 20-gauge needle was used to inject the knee area. The injection was tolerated well.  A band aid dressing was applied.  The patient was advised to apply ice later today and tomorrow to the injection sight as needed.  Electronically Signed Sanjuana Kava, MD 6/15/20179:59 AM

## 2016-03-02 ENCOUNTER — Encounter: Payer: Self-pay | Admitting: Orthopaedic Surgery

## 2016-03-02 ENCOUNTER — Ambulatory Visit (INDEPENDENT_AMBULATORY_CARE_PROVIDER_SITE_OTHER): Payer: Medicare HMO | Admitting: Orthopaedic Surgery

## 2016-03-02 DIAGNOSIS — M25561 Pain in right knee: Secondary | ICD-10-CM

## 2016-03-02 DIAGNOSIS — M25562 Pain in left knee: Secondary | ICD-10-CM

## 2016-03-02 MED ORDER — OXYCODONE HCL 15 MG PO TABS: ORAL_TABLET | ORAL | Status: DC

## 2016-03-02 NOTE — Progress Notes (Signed)
CC: Both of my knees are hurting. I would like an injection in both knees.  The patient has had chronic pain and tenderness of both knees for some time.  Injections help.  There is no locking or giving way of the knee.  There is no new trauma. There is no redness or signs of infections.  The knees have a mild effusion and some crepitus.  There is no redness or signs of recent trauma.  Right knee ROM is 0-90 and left knee ROM is 0-90.  Impression:  Chronic pain of the both knees  Return:  1 month  PROCEDURE NOTE:  The patient requests injections of both knees, verbal consent was obtained.  The left and right knee were individually prepped appropriately after time out was performed.   Sterile technique was observed and injection of 1 cc of Depo-Medrol 40 mg with several cc's of plain xylocaine. Anesthesia was provided by ethyl chloride and a 20-gauge needle was used to inject each knee area. The injections were tolerated well.  A band aid dressing was applied.  The patient was advised to apply ice later today and tomorrow to the injection sight as needed.  Electronically Signed Sanjuana Kava, MD 7/13/201710:46 AM

## 2016-03-24 ENCOUNTER — Encounter (HOSPITAL_BASED_OUTPATIENT_CLINIC_OR_DEPARTMENT_OTHER): Payer: Medicare HMO | Attending: Internal Medicine

## 2016-03-24 DIAGNOSIS — I87331 Chronic venous hypertension (idiopathic) with ulcer and inflammation of right lower extremity: Secondary | ICD-10-CM | POA: Diagnosis not present

## 2016-03-24 DIAGNOSIS — Z87891 Personal history of nicotine dependence: Secondary | ICD-10-CM | POA: Diagnosis not present

## 2016-03-24 DIAGNOSIS — G4730 Sleep apnea, unspecified: Secondary | ICD-10-CM | POA: Diagnosis not present

## 2016-03-24 DIAGNOSIS — Z6841 Body Mass Index (BMI) 40.0 and over, adult: Secondary | ICD-10-CM | POA: Diagnosis not present

## 2016-03-24 DIAGNOSIS — E11622 Type 2 diabetes mellitus with other skin ulcer: Secondary | ICD-10-CM | POA: Diagnosis not present

## 2016-03-24 DIAGNOSIS — E1122 Type 2 diabetes mellitus with diabetic chronic kidney disease: Secondary | ICD-10-CM | POA: Diagnosis not present

## 2016-03-24 DIAGNOSIS — N189 Chronic kidney disease, unspecified: Secondary | ICD-10-CM | POA: Insufficient documentation

## 2016-03-24 DIAGNOSIS — I130 Hypertensive heart and chronic kidney disease with heart failure and stage 1 through stage 4 chronic kidney disease, or unspecified chronic kidney disease: Secondary | ICD-10-CM | POA: Diagnosis not present

## 2016-03-24 DIAGNOSIS — I509 Heart failure, unspecified: Secondary | ICD-10-CM | POA: Diagnosis not present

## 2016-03-24 DIAGNOSIS — L97811 Non-pressure chronic ulcer of other part of right lower leg limited to breakdown of skin: Secondary | ICD-10-CM | POA: Diagnosis not present

## 2016-03-24 DIAGNOSIS — E1140 Type 2 diabetes mellitus with diabetic neuropathy, unspecified: Secondary | ICD-10-CM | POA: Insufficient documentation

## 2016-03-24 DIAGNOSIS — M1990 Unspecified osteoarthritis, unspecified site: Secondary | ICD-10-CM | POA: Insufficient documentation

## 2016-03-30 ENCOUNTER — Ambulatory Visit (INDEPENDENT_AMBULATORY_CARE_PROVIDER_SITE_OTHER): Payer: Medicare HMO | Admitting: Orthopaedic Surgery

## 2016-03-30 DIAGNOSIS — M25561 Pain in right knee: Secondary | ICD-10-CM

## 2016-03-30 DIAGNOSIS — M25562 Pain in left knee: Secondary | ICD-10-CM

## 2016-03-30 MED ORDER — OXYCODONE HCL 15 MG PO TABS: ORAL_TABLET | ORAL | 0 refills | Status: DC

## 2016-03-30 NOTE — Progress Notes (Signed)
CC: Both of my knees are hurting. I would like an injection in both knees.  The patient has had chronic pain and tenderness of both knees for some time.  Injections help.  There is no locking or giving way of the knee.  There is no new trauma. There is no redness or signs of infections.  The knees have a mild effusion and some crepitus.  There is no redness or signs of recent trauma.  Right knee ROM is 0-90 and left knee ROM is 0-95.  Impression:  Chronic pain of the both knees  Return:  1 month  PROCEDURE NOTE:  The patient requests injections of both knees, verbal consent was obtained.  The left and right knee were individually prepped appropriately after time out was performed.   Sterile technique was observed and injection of 1 cc of Depo-Medrol 40 mg with several cc's of plain xylocaine. Anesthesia was provided by ethyl chloride and a 20-gauge needle was used to inject each knee area. The injections were tolerated well.  A band aid dressing was applied.  The patient was advised to apply ice later today and tomorrow to the injection sight as needed.   Electronically Signed Sanjuana Kava, MD 8/10/20178:57 AM

## 2016-03-31 DIAGNOSIS — E11622 Type 2 diabetes mellitus with other skin ulcer: Secondary | ICD-10-CM | POA: Diagnosis not present

## 2016-04-07 DIAGNOSIS — E11622 Type 2 diabetes mellitus with other skin ulcer: Secondary | ICD-10-CM | POA: Diagnosis not present

## 2016-04-10 DIAGNOSIS — F411 Generalized anxiety disorder: Secondary | ICD-10-CM | POA: Insufficient documentation

## 2016-04-21 ENCOUNTER — Encounter (HOSPITAL_BASED_OUTPATIENT_CLINIC_OR_DEPARTMENT_OTHER): Payer: Medicare HMO | Attending: Internal Medicine

## 2016-04-21 DIAGNOSIS — E11622 Type 2 diabetes mellitus with other skin ulcer: Secondary | ICD-10-CM | POA: Insufficient documentation

## 2016-04-21 DIAGNOSIS — M109 Gout, unspecified: Secondary | ICD-10-CM | POA: Insufficient documentation

## 2016-04-21 DIAGNOSIS — X58XXXA Exposure to other specified factors, initial encounter: Secondary | ICD-10-CM | POA: Insufficient documentation

## 2016-04-21 DIAGNOSIS — E1151 Type 2 diabetes mellitus with diabetic peripheral angiopathy without gangrene: Secondary | ICD-10-CM | POA: Insufficient documentation

## 2016-04-21 DIAGNOSIS — I110 Hypertensive heart disease with heart failure: Secondary | ICD-10-CM | POA: Insufficient documentation

## 2016-04-21 DIAGNOSIS — S31103A Unspecified open wound of abdominal wall, right lower quadrant without penetration into peritoneal cavity, initial encounter: Secondary | ICD-10-CM | POA: Insufficient documentation

## 2016-04-21 DIAGNOSIS — G4730 Sleep apnea, unspecified: Secondary | ICD-10-CM | POA: Insufficient documentation

## 2016-04-21 DIAGNOSIS — I509 Heart failure, unspecified: Secondary | ICD-10-CM | POA: Diagnosis not present

## 2016-04-21 DIAGNOSIS — E1140 Type 2 diabetes mellitus with diabetic neuropathy, unspecified: Secondary | ICD-10-CM | POA: Diagnosis not present

## 2016-04-21 DIAGNOSIS — Z6841 Body Mass Index (BMI) 40.0 and over, adult: Secondary | ICD-10-CM | POA: Insufficient documentation

## 2016-04-21 DIAGNOSIS — I87331 Chronic venous hypertension (idiopathic) with ulcer and inflammation of right lower extremity: Secondary | ICD-10-CM | POA: Diagnosis not present

## 2016-04-21 DIAGNOSIS — L97811 Non-pressure chronic ulcer of other part of right lower leg limited to breakdown of skin: Secondary | ICD-10-CM | POA: Diagnosis not present

## 2016-04-21 DIAGNOSIS — Z8614 Personal history of Methicillin resistant Staphylococcus aureus infection: Secondary | ICD-10-CM | POA: Diagnosis not present

## 2016-04-27 ENCOUNTER — Ambulatory Visit (INDEPENDENT_AMBULATORY_CARE_PROVIDER_SITE_OTHER): Payer: Medicare HMO | Admitting: Orthopaedic Surgery

## 2016-04-27 DIAGNOSIS — M25561 Pain in right knee: Secondary | ICD-10-CM

## 2016-04-27 DIAGNOSIS — M25562 Pain in left knee: Secondary | ICD-10-CM

## 2016-04-27 MED ORDER — OXYCODONE HCL 15 MG PO TABS: ORAL_TABLET | ORAL | 0 refills | Status: DC

## 2016-04-27 NOTE — Progress Notes (Signed)
CC: Both of my knees are hurting. I would like an injection in both knees.  The patient has had chronic pain and tenderness of both knees for some time.  Injections help.  There is no locking or giving way of the knee.  There is no new trauma. There is no redness or signs of infections.  The knees have a mild effusion and some crepitus.  There is no redness or signs of recent trauma.  Right knee ROM is 0-90 and left knee ROM is 0-95.  Impression:  Chronic pain of the both knees  Return:  1 month  PROCEDURE NOTE:  The patient requests injections of both knees, verbal consent was obtained.  The left and right knee were individually prepped appropriately after time out was performed.   Sterile technique was observed and injection of 1 cc of Depo-Medrol 40 mg with several cc's of plain xylocaine. Anesthesia was provided by ethyl chloride and a 20-gauge needle was used to inject each knee area. The injections were tolerated well.  A band aid dressing was applied.  The patient was advised to apply ice later today and tomorrow to the injection sight as needed.    Electronically Signed Sanjuana Kava, MD 9/7/20179:39 AM

## 2016-04-28 DIAGNOSIS — I87331 Chronic venous hypertension (idiopathic) with ulcer and inflammation of right lower extremity: Secondary | ICD-10-CM | POA: Diagnosis not present

## 2016-05-05 DIAGNOSIS — I87331 Chronic venous hypertension (idiopathic) with ulcer and inflammation of right lower extremity: Secondary | ICD-10-CM | POA: Diagnosis not present

## 2016-05-12 DIAGNOSIS — I87331 Chronic venous hypertension (idiopathic) with ulcer and inflammation of right lower extremity: Secondary | ICD-10-CM | POA: Diagnosis not present

## 2016-05-19 DIAGNOSIS — I87331 Chronic venous hypertension (idiopathic) with ulcer and inflammation of right lower extremity: Secondary | ICD-10-CM | POA: Diagnosis not present

## 2016-05-26 ENCOUNTER — Encounter (HOSPITAL_BASED_OUTPATIENT_CLINIC_OR_DEPARTMENT_OTHER): Payer: Medicare HMO | Attending: Internal Medicine

## 2016-05-26 DIAGNOSIS — E11622 Type 2 diabetes mellitus with other skin ulcer: Secondary | ICD-10-CM | POA: Insufficient documentation

## 2016-05-26 DIAGNOSIS — M1990 Unspecified osteoarthritis, unspecified site: Secondary | ICD-10-CM | POA: Insufficient documentation

## 2016-05-26 DIAGNOSIS — I509 Heart failure, unspecified: Secondary | ICD-10-CM | POA: Insufficient documentation

## 2016-05-26 DIAGNOSIS — L97811 Non-pressure chronic ulcer of other part of right lower leg limited to breakdown of skin: Secondary | ICD-10-CM | POA: Insufficient documentation

## 2016-05-26 DIAGNOSIS — Z6841 Body Mass Index (BMI) 40.0 and over, adult: Secondary | ICD-10-CM | POA: Insufficient documentation

## 2016-05-26 DIAGNOSIS — E1140 Type 2 diabetes mellitus with diabetic neuropathy, unspecified: Secondary | ICD-10-CM | POA: Insufficient documentation

## 2016-05-26 DIAGNOSIS — G4730 Sleep apnea, unspecified: Secondary | ICD-10-CM | POA: Insufficient documentation

## 2016-05-26 DIAGNOSIS — I110 Hypertensive heart disease with heart failure: Secondary | ICD-10-CM | POA: Insufficient documentation

## 2016-05-30 ENCOUNTER — Ambulatory Visit (INDEPENDENT_AMBULATORY_CARE_PROVIDER_SITE_OTHER): Payer: Medicare HMO | Admitting: Orthopaedic Surgery

## 2016-05-30 DIAGNOSIS — M25562 Pain in left knee: Secondary | ICD-10-CM

## 2016-05-30 DIAGNOSIS — G8929 Other chronic pain: Secondary | ICD-10-CM

## 2016-05-30 DIAGNOSIS — M25561 Pain in right knee: Secondary | ICD-10-CM | POA: Diagnosis not present

## 2016-05-30 MED ORDER — OXYCODONE HCL 15 MG PO TABS: ORAL_TABLET | ORAL | 0 refills | Status: DC

## 2016-05-30 NOTE — Progress Notes (Signed)
CC: Both of my knees are hurting. I would like an injection in both knees.  The patient has had chronic pain and tenderness of both knees for some time.  Injections help.  There is no locking or giving way of the knee.  There is no new trauma. There is no redness or signs of infections.  The knees have a mild effusion and some crepitus.  There is no redness or signs of recent trauma.  Right knee ROM is 0-90 and left knee ROM is 0-95.  Impression:  Chronic pain of the both knees  Return:  1 month  PROCEDURE NOTE:  The patient requests injections of both knees, verbal consent was obtained.  The left and right knee were individually prepped appropriately after time out was performed.   Sterile technique was observed and injection of 1 cc of Depo-Medrol 40 mg with several cc's of plain xylocaine. Anesthesia was provided by ethyl chloride and a 20-gauge needle was used to inject each knee area. The injections were tolerated well.  A band aid dressing was applied.  The patient was advised to apply ice later today and tomorrow to the injection sight as needed.  Electronically Signed Sanjuana Kava, MD 10/10/20179:20 AM

## 2016-06-09 DIAGNOSIS — Z6841 Body Mass Index (BMI) 40.0 and over, adult: Secondary | ICD-10-CM | POA: Diagnosis not present

## 2016-06-09 DIAGNOSIS — M1990 Unspecified osteoarthritis, unspecified site: Secondary | ICD-10-CM | POA: Diagnosis not present

## 2016-06-09 DIAGNOSIS — E11622 Type 2 diabetes mellitus with other skin ulcer: Secondary | ICD-10-CM | POA: Diagnosis not present

## 2016-06-09 DIAGNOSIS — E1140 Type 2 diabetes mellitus with diabetic neuropathy, unspecified: Secondary | ICD-10-CM | POA: Diagnosis not present

## 2016-06-09 DIAGNOSIS — I110 Hypertensive heart disease with heart failure: Secondary | ICD-10-CM | POA: Diagnosis not present

## 2016-06-09 DIAGNOSIS — G4730 Sleep apnea, unspecified: Secondary | ICD-10-CM | POA: Diagnosis not present

## 2016-06-09 DIAGNOSIS — I509 Heart failure, unspecified: Secondary | ICD-10-CM | POA: Diagnosis not present

## 2016-06-09 DIAGNOSIS — L97811 Non-pressure chronic ulcer of other part of right lower leg limited to breakdown of skin: Secondary | ICD-10-CM | POA: Diagnosis not present

## 2016-06-16 ENCOUNTER — Other Ambulatory Visit: Payer: Self-pay | Admitting: Internal Medicine

## 2016-06-16 DIAGNOSIS — E11622 Type 2 diabetes mellitus with other skin ulcer: Secondary | ICD-10-CM | POA: Diagnosis not present

## 2016-06-22 ENCOUNTER — Encounter (HOSPITAL_BASED_OUTPATIENT_CLINIC_OR_DEPARTMENT_OTHER): Payer: Medicare HMO | Attending: Internal Medicine

## 2016-06-22 DIAGNOSIS — X58XXXA Exposure to other specified factors, initial encounter: Secondary | ICD-10-CM | POA: Diagnosis not present

## 2016-06-22 DIAGNOSIS — I87331 Chronic venous hypertension (idiopathic) with ulcer and inflammation of right lower extremity: Secondary | ICD-10-CM | POA: Insufficient documentation

## 2016-06-22 DIAGNOSIS — E11622 Type 2 diabetes mellitus with other skin ulcer: Secondary | ICD-10-CM | POA: Insufficient documentation

## 2016-06-22 DIAGNOSIS — S31103A Unspecified open wound of abdominal wall, right lower quadrant without penetration into peritoneal cavity, initial encounter: Secondary | ICD-10-CM | POA: Insufficient documentation

## 2016-06-22 DIAGNOSIS — G4730 Sleep apnea, unspecified: Secondary | ICD-10-CM | POA: Insufficient documentation

## 2016-06-22 DIAGNOSIS — L97812 Non-pressure chronic ulcer of other part of right lower leg with fat layer exposed: Secondary | ICD-10-CM | POA: Insufficient documentation

## 2016-06-22 DIAGNOSIS — E1140 Type 2 diabetes mellitus with diabetic neuropathy, unspecified: Secondary | ICD-10-CM | POA: Diagnosis not present

## 2016-06-22 DIAGNOSIS — Z6841 Body Mass Index (BMI) 40.0 and over, adult: Secondary | ICD-10-CM | POA: Diagnosis not present

## 2016-06-22 DIAGNOSIS — I10 Essential (primary) hypertension: Secondary | ICD-10-CM | POA: Diagnosis not present

## 2016-06-22 DIAGNOSIS — L89212 Pressure ulcer of right hip, stage 2: Secondary | ICD-10-CM | POA: Insufficient documentation

## 2016-06-29 ENCOUNTER — Encounter: Payer: Self-pay | Admitting: Orthopaedic Surgery

## 2016-06-29 ENCOUNTER — Ambulatory Visit (INDEPENDENT_AMBULATORY_CARE_PROVIDER_SITE_OTHER): Payer: Medicare HMO | Admitting: Orthopaedic Surgery

## 2016-06-29 DIAGNOSIS — I87331 Chronic venous hypertension (idiopathic) with ulcer and inflammation of right lower extremity: Secondary | ICD-10-CM | POA: Diagnosis not present

## 2016-06-29 DIAGNOSIS — M25562 Pain in left knee: Secondary | ICD-10-CM | POA: Diagnosis not present

## 2016-06-29 DIAGNOSIS — M25561 Pain in right knee: Secondary | ICD-10-CM | POA: Diagnosis not present

## 2016-06-29 DIAGNOSIS — G8929 Other chronic pain: Secondary | ICD-10-CM | POA: Diagnosis not present

## 2016-06-29 MED ORDER — OXYCODONE HCL 15 MG PO TABS: ORAL_TABLET | ORAL | 0 refills | Status: DC

## 2016-06-29 NOTE — Progress Notes (Signed)
CC: Both of my knees are hurting. I would like an injection in both knees.  The patient has had chronic pain and tenderness of both knees for some time.  Injections help.  There is no locking or giving way of the knee.  There is no new trauma. There is no redness or signs of infections.  The knees have a mild effusion and some crepitus.  There is no redness or signs of recent trauma.  Right knee ROM is 0-95 and left knee ROM is 0-90.  Impression:  Chronic pain of the both knees  Return:  1 month  PROCEDURE NOTE:  The patient requests injections of both knees, verbal consent was obtained.  The left and right knee were individually prepped appropriately after time out was performed.   Sterile technique was observed and injection of 1 cc of Depo-Medrol 40 mg with several cc's of plain xylocaine. Anesthesia was provided by ethyl chloride and a 20-gauge needle was used to inject each knee area. The injections were tolerated well.  A band aid dressing was applied.  The patient was advised to apply ice later today and tomorrow to the injection sight as needed.   Electronically Signed Sanjuana Kava, MD 11/9/20179:10 AM

## 2016-07-06 DIAGNOSIS — I87331 Chronic venous hypertension (idiopathic) with ulcer and inflammation of right lower extremity: Secondary | ICD-10-CM | POA: Diagnosis not present

## 2016-07-20 DIAGNOSIS — I87331 Chronic venous hypertension (idiopathic) with ulcer and inflammation of right lower extremity: Secondary | ICD-10-CM | POA: Diagnosis not present

## 2016-07-27 ENCOUNTER — Emergency Department (HOSPITAL_COMMUNITY): Payer: Medicare HMO

## 2016-07-27 ENCOUNTER — Encounter (HOSPITAL_COMMUNITY): Payer: Self-pay

## 2016-07-27 ENCOUNTER — Emergency Department (HOSPITAL_COMMUNITY)
Admission: EM | Admit: 2016-07-27 | Discharge: 2016-07-27 | Disposition: A | Discharge status: Home / Self Care | Payer: Medicare HMO | Attending: Emergency Medicine | Admitting: Emergency Medicine

## 2016-07-27 ENCOUNTER — Encounter (HOSPITAL_BASED_OUTPATIENT_CLINIC_OR_DEPARTMENT_OTHER): Payer: Medicare HMO | Attending: Internal Medicine

## 2016-07-27 ENCOUNTER — Ambulatory Visit (INDEPENDENT_AMBULATORY_CARE_PROVIDER_SITE_OTHER): Payer: Medicare HMO | Admitting: Orthopaedic Surgery

## 2016-07-27 DIAGNOSIS — Z791 Long term (current) use of non-steroidal anti-inflammatories (NSAID): Secondary | ICD-10-CM | POA: Insufficient documentation

## 2016-07-27 DIAGNOSIS — I10 Essential (primary) hypertension: Secondary | ICD-10-CM | POA: Insufficient documentation

## 2016-07-27 DIAGNOSIS — E119 Type 2 diabetes mellitus without complications: Secondary | ICD-10-CM | POA: Insufficient documentation

## 2016-07-27 DIAGNOSIS — W010XXA Fall on same level from slipping, tripping and stumbling without subsequent striking against object, initial encounter: Secondary | ICD-10-CM | POA: Insufficient documentation

## 2016-07-27 DIAGNOSIS — M25561 Pain in right knee: Secondary | ICD-10-CM

## 2016-07-27 DIAGNOSIS — Z9861 Coronary angioplasty status: Secondary | ICD-10-CM | POA: Diagnosis not present

## 2016-07-27 DIAGNOSIS — Y92009 Unspecified place in unspecified non-institutional (private) residence as the place of occurrence of the external cause: Secondary | ICD-10-CM | POA: Diagnosis not present

## 2016-07-27 DIAGNOSIS — S93402A Sprain of unspecified ligament of left ankle, initial encounter: Secondary | ICD-10-CM | POA: Diagnosis not present

## 2016-07-27 DIAGNOSIS — Z6841 Body Mass Index (BMI) 40.0 and over, adult: Secondary | ICD-10-CM | POA: Insufficient documentation

## 2016-07-27 DIAGNOSIS — E11622 Type 2 diabetes mellitus with other skin ulcer: Secondary | ICD-10-CM | POA: Insufficient documentation

## 2016-07-27 DIAGNOSIS — L89892 Pressure ulcer of other site, stage 2: Secondary | ICD-10-CM | POA: Insufficient documentation

## 2016-07-27 DIAGNOSIS — Y999 Unspecified external cause status: Secondary | ICD-10-CM | POA: Insufficient documentation

## 2016-07-27 DIAGNOSIS — S99912A Unspecified injury of left ankle, initial encounter: Secondary | ICD-10-CM | POA: Diagnosis present

## 2016-07-27 DIAGNOSIS — L97212 Non-pressure chronic ulcer of right calf with fat layer exposed: Secondary | ICD-10-CM | POA: Insufficient documentation

## 2016-07-27 DIAGNOSIS — M25562 Pain in left knee: Secondary | ICD-10-CM

## 2016-07-27 DIAGNOSIS — G8929 Other chronic pain: Secondary | ICD-10-CM | POA: Diagnosis not present

## 2016-07-27 DIAGNOSIS — I87331 Chronic venous hypertension (idiopathic) with ulcer and inflammation of right lower extremity: Secondary | ICD-10-CM | POA: Insufficient documentation

## 2016-07-27 DIAGNOSIS — S31103A Unspecified open wound of abdominal wall, right lower quadrant without penetration into peritoneal cavity, initial encounter: Secondary | ICD-10-CM | POA: Insufficient documentation

## 2016-07-27 DIAGNOSIS — Y9389 Activity, other specified: Secondary | ICD-10-CM | POA: Insufficient documentation

## 2016-07-27 DIAGNOSIS — E1140 Type 2 diabetes mellitus with diabetic neuropathy, unspecified: Secondary | ICD-10-CM | POA: Insufficient documentation

## 2016-07-27 DIAGNOSIS — X58XXXA Exposure to other specified factors, initial encounter: Secondary | ICD-10-CM | POA: Insufficient documentation

## 2016-07-27 DIAGNOSIS — G4730 Sleep apnea, unspecified: Secondary | ICD-10-CM | POA: Insufficient documentation

## 2016-07-27 HISTORY — DX: Essential (primary) hypertension: I10

## 2016-07-27 HISTORY — DX: Type 2 diabetes mellitus without complications: E11.9

## 2016-07-27 HISTORY — DX: Cellulitis, unspecified: L03.90

## 2016-07-27 HISTORY — DX: Dependence on supplemental oxygen: Z99.81

## 2016-07-27 MED ORDER — OXYCODONE HCL 15 MG PO TABS: ORAL_TABLET | ORAL | 0 refills | Status: DC

## 2016-07-27 NOTE — ED Notes (Signed)
Family to bring pt home

## 2016-07-27 NOTE — Progress Notes (Signed)
CC: Both of my knees are hurting. I would like an injection in both knees.  The patient has had chronic pain and tenderness of both knees for some time.  Injections help.  There is no locking or giving way of the knee.  There is no new trauma. There is no redness or signs of infections.  The knees have a mild effusion and some crepitus.  There is no redness or signs of recent trauma.  Right knee ROM is 0-95 and left knee ROM is 0-90.  Impression:  Chronic pain of the both knees  Return:  1 month  PROCEDURE NOTE:  The patient requests injections of both knees, verbal consent was obtained.  The left and right knee were individually prepped appropriately after time out was performed.   Sterile technique was observed and injection of 1 cc of Depo-Medrol 40 mg with several cc's of plain xylocaine. Anesthesia was provided by ethyl chloride and a 20-gauge needle was used to inject each knee area. The injections were tolerated well.  A band aid dressing was applied.  The patient was advised to apply ice later today and tomorrow to the injection sight as needed.   Electronically Signed Sanjuana Kava, MD 12/7/20179:42 AM

## 2016-07-27 NOTE — Discharge Instructions (Signed)
X-rays negative for any ankle fracture. Use the ASO wrap for comfort. Follow-up with orthopedics if ankle not improving over the next few days.

## 2016-07-27 NOTE — ED Provider Notes (Signed)
[place] DEPT Provider Note   CSN: [id number] Arrival date & time: 07/27/16  1259  By signing my name below, I, Rayna Sexton, attest that this documentation has been prepared under the direction and in the presence of Fredia Sorrow, MD. Electronically Signed: Rayna Sexton, ED Scribe. 07/27/16. 2:40 PM.   History   Chief Complaint Chief Complaint  Patient presents with  . Fall    ankle pain    HPI HPI Comments: [name] is a 55 y.o. male who presents to the Emergency Department by ambulance complaining of Left ankle pain   The history is provided by the patient and medical records. No language interpreter was used.  Fall  Pertinent negatives include no chest pain, no abdominal pain, no headaches and no shortness of breath.   Patient brought in by EMS. Just got back from a doctor's visit slipped while trying to get into a chair in his house. Overturned his left ankle. No other injuries. Patient initially had pain on the top of his foot however now that's resolved.  Past Medical History:  Diagnosis Date  . Cellulitis   . Diabetes mellitus without complication ([place])   . Hypertension   . On home oxygen therapy     Patient Active Problem List   Diagnosis Date Noted  . Right knee pain 10/06/2015    Past Surgical History:  Procedure Laterality Date  . CORONARY ANGIOPLASTY        Home Medications    Prior to Admission medications   Medication Sig Start Date End Date Taking? Authorizing Provider  meloxicam (MOBIC) 15 MG tablet TAKE 1 TABLET DAILY 01/10/16   Sanjuana Kava, MD  oxyCODONE (ROXICODONE) 15 MG immediate release tablet One tablet every four to six hours for pain.  Must last 30 days. 07/27/16   Sanjuana Kava, MD    Family History No family history on file.  Social History Social History  Substance Use Topics  . Smoking status: Never Smoker  . Smokeless tobacco: Never Used  . Alcohol use No     Allergies   Patient has no known  allergies.   Review of Systems Review of Systems  Constitutional: Negative for chills and fever.  HENT: Negative for congestion, postnasal drip, rhinorrhea, sinus pressure and sore throat.   Eyes: Negative for visual disturbance.  Respiratory: Negative for cough and shortness of breath.   Cardiovascular: Negative for chest pain and leg swelling.  Gastrointestinal: Negative for abdominal pain, diarrhea, nausea and vomiting.  Genitourinary: Negative for dysuria and hematuria.  Musculoskeletal: Positive for joint swelling. Negative for back pain.  Skin: Negative for rash.  Neurological: Negative for syncope, light-headedness and headaches.  Hematological: Does not bruise/bleed easily.  Psychiatric/Behavioral: Negative for confusion.  All other systems reviewed and are negative.   Physical Exam Updated Vital Signs BP (!) 105/45 (BP Location: Right Wrist)   Pulse 93   Temp 98 F (36.7 C) (Oral)   Resp 24   Ht 5\' 11"  (1.803 m)   Wt (!) 261.3 kg   SpO2 98%   BMI 80.34 kg/m   Physical Exam  Constitutional: He is oriented to person, place, and time. He appears well-developed and well-nourished. No distress.  HENT:  Head: Normocephalic and atraumatic.  Mouth/Throat: Oropharynx is clear and moist.  Eyes: Conjunctivae and EOM are normal. Pupils are equal, round, and reactive to light. No scleral icterus.  Neck: Normal range of motion. Neck supple. No tracheal deviation present.  Cardiovascular: Normal rate, regular rhythm, normal  heart sounds and intact distal pulses.   Pulmonary/Chest: Effort normal and breath sounds normal. No respiratory distress. He has no wheezes. He has no rales.  Abdominal: Soft. Bowel sounds are normal. There is no tenderness.  Musculoskeletal: Normal range of motion. He exhibits edema. He exhibits no tenderness or deformity.  Left foot and ankle with some slight swelling. No bony tenderness around the ankle or foot no tenderness to the proximal leg. Good cap  refill less than 2 seconds. Dorsalis pedis pulses 2+.  Neurological: He is alert and oriented to person, place, and time. No cranial nerve deficit. He exhibits normal muscle tone. Coordination normal.  Skin: Skin is warm and dry. Capillary refill takes less than 2 seconds.  Psychiatric: He has a normal mood and affect. His behavior is normal.  Nursing note and vitals reviewed.   ED Treatments / Results  Labs (all labs ordered are listed, but only abnormal results are displayed) Labs Reviewed - No data to display  EKG  EKG Interpretation None       Radiology Dg Ankle Complete Left  Result Date: 07/27/2016 CLINICAL DATA:  Fall.  Left ankle pain EXAM: LEFT ANKLE COMPLETE - 3+ VIEW COMPARISON:  07/27/2016 FINDINGS: Medial soft tissue swelling. Normal alignment. Negative for acute fracture. Mild degenerative change in the ankle joint. Arterial calcification. Calcaneal spurring. IMPRESSION: Medial soft tissue swelling.  Negative for ankle fracture. Electronically Signed   By: Franchot Gallo M.D.   On: 07/27/2016 14:10   Dg Foot Complete Left  Result Date: 07/27/2016 CLINICAL DATA:  Left foot and ankle pain EXAM: LEFT FOOT - COMPLETE 3+ VIEW COMPARISON:  None. FINDINGS: Degenerative changes in the midfoot and hindfoot. Plantar calcaneal spur. No acute bony abnormality. Specifically, no fracture, subluxation, or dislocation. Soft tissues are intact. IMPRESSION: No acute bony abnormality. Electronically Signed   By: Rolm Baptise M.D.   On: 07/27/2016 14:05    Procedures Procedures  DIAGNOSTIC STUDIES: Oxygen Saturation is 98% on RA, normal by my interpretation.    COORDINATION OF CARE: 2:40 PM Discussed next steps with pt. Pt verbalized understanding and is agreeable with the plan.    Medications Ordered in ED Medications - No data to display   Initial Impression / Assessment and Plan / ED Course  I have reviewed the triage vital signs and the nursing notes.  Pertinent labs &  imaging results that were available during my care of the patient were reviewed by me and considered in my medical decision making (see chart for details).  Clinical Course    Patient is x-ray showed no evidence of any bony injury to the left foot or ankle. Symptoms have now resolved. Will treat with ASO wrap and follow-up with orthopedics.  I personally performed the services described in this documentation, which was scribed in my presence. The recorded information has been reviewed and is accurate.    Final Clinical Impressions(s) / ED Diagnoses   Final diagnoses:  Sprain of left ankle, unspecified ligament, initial encounter    New Prescriptions New Prescriptions   No medications on file     Fredia Sorrow, MD 07/27/16 1441

## 2016-07-28 DIAGNOSIS — L97212 Non-pressure chronic ulcer of right calf with fat layer exposed: Secondary | ICD-10-CM | POA: Diagnosis not present

## 2016-07-28 DIAGNOSIS — I87331 Chronic venous hypertension (idiopathic) with ulcer and inflammation of right lower extremity: Secondary | ICD-10-CM | POA: Diagnosis not present

## 2016-07-28 DIAGNOSIS — X58XXXA Exposure to other specified factors, initial encounter: Secondary | ICD-10-CM | POA: Diagnosis not present

## 2016-07-28 DIAGNOSIS — E11622 Type 2 diabetes mellitus with other skin ulcer: Secondary | ICD-10-CM | POA: Diagnosis not present

## 2016-07-28 DIAGNOSIS — G4730 Sleep apnea, unspecified: Secondary | ICD-10-CM | POA: Diagnosis not present

## 2016-07-28 DIAGNOSIS — E1140 Type 2 diabetes mellitus with diabetic neuropathy, unspecified: Secondary | ICD-10-CM | POA: Diagnosis not present

## 2016-07-28 DIAGNOSIS — S31103A Unspecified open wound of abdominal wall, right lower quadrant without penetration into peritoneal cavity, initial encounter: Secondary | ICD-10-CM | POA: Diagnosis not present

## 2016-07-28 DIAGNOSIS — I10 Essential (primary) hypertension: Secondary | ICD-10-CM | POA: Diagnosis not present

## 2016-07-28 DIAGNOSIS — Z6841 Body Mass Index (BMI) 40.0 and over, adult: Secondary | ICD-10-CM | POA: Diagnosis not present

## 2016-07-28 DIAGNOSIS — L89892 Pressure ulcer of other site, stage 2: Secondary | ICD-10-CM | POA: Diagnosis not present

## 2016-08-04 DIAGNOSIS — I87331 Chronic venous hypertension (idiopathic) with ulcer and inflammation of right lower extremity: Secondary | ICD-10-CM | POA: Diagnosis not present

## 2016-08-08 ENCOUNTER — Telehealth: Payer: Self-pay | Admitting: Orthopaedic Surgery

## 2016-08-08 MED ORDER — MELOXICAM 15 MG PO TABS
15.0000 mg | ORAL_TABLET | Freq: Every day | ORAL | 5 refills | Status: DC
Start: 2016-08-08 — End: 2017-01-23

## 2016-08-08 NOTE — Telephone Encounter (Signed)
Meloxicam (MOBIC) 15 MG    Qty 30 Tablets

## 2016-08-18 DIAGNOSIS — I87331 Chronic venous hypertension (idiopathic) with ulcer and inflammation of right lower extremity: Secondary | ICD-10-CM | POA: Diagnosis not present

## 2016-08-22 ENCOUNTER — Telehealth: Payer: Self-pay | Admitting: Orthopaedic Surgery

## 2016-08-22 MED ORDER — OXYCODONE HCL 15 MG PO TABS: ORAL_TABLET | ORAL | 0 refills | Status: DC
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
## 2016-08-31 ENCOUNTER — Ambulatory Visit: Payer: Medicare HMO | Admitting: Orthopaedic Surgery

## 2016-09-01 ENCOUNTER — Encounter (HOSPITAL_BASED_OUTPATIENT_CLINIC_OR_DEPARTMENT_OTHER): Payer: Medicare HMO | Attending: Internal Medicine

## 2016-09-01 DIAGNOSIS — L97811 Non-pressure chronic ulcer of other part of right lower leg limited to breakdown of skin: Secondary | ICD-10-CM | POA: Insufficient documentation

## 2016-09-01 DIAGNOSIS — X58XXXA Exposure to other specified factors, initial encounter: Secondary | ICD-10-CM | POA: Insufficient documentation

## 2016-09-01 DIAGNOSIS — I87331 Chronic venous hypertension (idiopathic) with ulcer and inflammation of right lower extremity: Secondary | ICD-10-CM | POA: Diagnosis present

## 2016-09-01 DIAGNOSIS — S31103A Unspecified open wound of abdominal wall, right lower quadrant without penetration into peritoneal cavity, initial encounter: Secondary | ICD-10-CM | POA: Insufficient documentation

## 2016-09-01 DIAGNOSIS — L89892 Pressure ulcer of other site, stage 2: Secondary | ICD-10-CM | POA: Insufficient documentation

## 2016-09-01 DIAGNOSIS — L97812 Non-pressure chronic ulcer of other part of right lower leg with fat layer exposed: Secondary | ICD-10-CM | POA: Insufficient documentation

## 2016-09-01 DIAGNOSIS — I890 Lymphedema, not elsewhere classified: Secondary | ICD-10-CM | POA: Insufficient documentation

## 2016-09-01 DIAGNOSIS — Z6841 Body Mass Index (BMI) 40.0 and over, adult: Secondary | ICD-10-CM | POA: Diagnosis not present

## 2016-09-01 DIAGNOSIS — E11622 Type 2 diabetes mellitus with other skin ulcer: Secondary | ICD-10-CM | POA: Diagnosis not present

## 2016-09-06 ENCOUNTER — Ambulatory Visit: Payer: Medicare HMO | Admitting: Orthopaedic Surgery

## 2016-09-12 ENCOUNTER — Ambulatory Visit (INDEPENDENT_AMBULATORY_CARE_PROVIDER_SITE_OTHER): Payer: Medicare HMO | Admitting: Orthopaedic Surgery

## 2016-09-12 DIAGNOSIS — G8929 Other chronic pain: Secondary | ICD-10-CM | POA: Diagnosis not present

## 2016-09-12 DIAGNOSIS — M25562 Pain in left knee: Secondary | ICD-10-CM | POA: Diagnosis not present

## 2016-09-12 DIAGNOSIS — M25561 Pain in right knee: Secondary | ICD-10-CM

## 2016-09-12 MED ORDER — OXYCODONE HCL 15 MG PO TABS: ORAL_TABLET | ORAL | 0 refills | Status: DC

## 2016-09-12 NOTE — Progress Notes (Signed)
CC: Both of my knees are hurting. I would like an injection in both knees.  The patient has had chronic pain and tenderness of both knees for some time.  Injections help.  There is no locking or giving way of the knee.  There is no new trauma. There is no redness or signs of infections.  The knees have a mild effusion and some crepitus.  There is no redness or signs of recent trauma.  Right knee ROM is 0-90 and left knee ROM is 0-95.  Impression:  Chronic pain of the both knees  Return:  1 month  PROCEDURE NOTE:  The patient requests injections of both knees, verbal consent was obtained.  The left and right knee were individually prepped appropriately after time out was performed.   Sterile technique was observed and injection of 1 cc of Depo-Medrol 40 mg with several cc's of plain xylocaine. Anesthesia was provided by ethyl chloride and a 20-gauge needle was used to inject each knee area. The injections were tolerated well.  A band aid dressing was applied.  The patient was advised to apply ice later today and tomorrow to the injection sight as needed.  Pain medicine was refilled after first checking the state narcotic site.  Electronically Signed Sanjuana Kava, MD 1/23/201811:13 AM

## 2016-09-15 DIAGNOSIS — I87331 Chronic venous hypertension (idiopathic) with ulcer and inflammation of right lower extremity: Secondary | ICD-10-CM | POA: Diagnosis not present

## 2016-09-29 ENCOUNTER — Encounter (HOSPITAL_BASED_OUTPATIENT_CLINIC_OR_DEPARTMENT_OTHER): Payer: Medicare HMO | Attending: Internal Medicine

## 2016-09-29 DIAGNOSIS — I87331 Chronic venous hypertension (idiopathic) with ulcer and inflammation of right lower extremity: Secondary | ICD-10-CM | POA: Diagnosis present

## 2016-09-29 DIAGNOSIS — X58XXXA Exposure to other specified factors, initial encounter: Secondary | ICD-10-CM | POA: Insufficient documentation

## 2016-09-29 DIAGNOSIS — L97811 Non-pressure chronic ulcer of other part of right lower leg limited to breakdown of skin: Secondary | ICD-10-CM | POA: Diagnosis not present

## 2016-09-29 DIAGNOSIS — E11622 Type 2 diabetes mellitus with other skin ulcer: Secondary | ICD-10-CM | POA: Insufficient documentation

## 2016-09-29 DIAGNOSIS — S31103D Unspecified open wound of abdominal wall, right lower quadrant without penetration into peritoneal cavity, subsequent encounter: Secondary | ICD-10-CM | POA: Insufficient documentation

## 2016-09-29 DIAGNOSIS — L97812 Non-pressure chronic ulcer of other part of right lower leg with fat layer exposed: Secondary | ICD-10-CM | POA: Diagnosis not present

## 2016-09-29 DIAGNOSIS — L89892 Pressure ulcer of other site, stage 2: Secondary | ICD-10-CM | POA: Diagnosis not present

## 2016-09-29 DIAGNOSIS — Z6841 Body Mass Index (BMI) 40.0 and over, adult: Secondary | ICD-10-CM | POA: Insufficient documentation

## 2016-10-12 ENCOUNTER — Ambulatory Visit (INDEPENDENT_AMBULATORY_CARE_PROVIDER_SITE_OTHER): Payer: Medicare HMO | Admitting: Orthopaedic Surgery

## 2016-10-12 DIAGNOSIS — M25562 Pain in left knee: Secondary | ICD-10-CM

## 2016-10-12 DIAGNOSIS — G8929 Other chronic pain: Secondary | ICD-10-CM | POA: Diagnosis not present

## 2016-10-12 DIAGNOSIS — M25561 Pain in right knee: Secondary | ICD-10-CM | POA: Diagnosis not present

## 2016-10-12 MED ORDER — OXYCODONE HCL 15 MG PO TABS: ORAL_TABLET | ORAL | 0 refills | Status: DC

## 2016-10-12 NOTE — Progress Notes (Signed)
CC: Both of my knees are hurting. I would like an injection in both knees.  The patient has had chronic pain and tenderness of both knees for some time.  Injections help.  There is no locking or giving way of the knee.  There is no new trauma. There is no redness or signs of infections.  The knees have a mild effusion and some crepitus.  There is no redness or signs of recent trauma.  Right knee ROM is 0-90 and left knee ROM is 0-95.  Impression:  Chronic pain of the both knees  Return:  1 month  PROCEDURE NOTE:  The patient requests injections of both knees, verbal consent was obtained.  The left and right knee were individually prepped appropriately after time out was performed.   Sterile technique was observed and injection of 1 cc of Depo-Medrol 40 mg with several cc's of plain xylocaine. Anesthesia was provided by ethyl chloride and a 20-gauge needle was used to inject each knee area. The injections were tolerated well.  A band aid dressing was applied.  The patient was advised to apply ice later today and tomorrow to the injection sight as needed.  I have reviewed the [place] web site prior to prescribing narcotic medicine for this patient.  Electronically Signed Sanjuana Kava, MD 2/22/20189:18 AM

## 2016-10-13 DIAGNOSIS — I87331 Chronic venous hypertension (idiopathic) with ulcer and inflammation of right lower extremity: Secondary | ICD-10-CM | POA: Diagnosis not present

## 2016-10-27 ENCOUNTER — Encounter (HOSPITAL_BASED_OUTPATIENT_CLINIC_OR_DEPARTMENT_OTHER): Payer: Medicare HMO | Attending: Internal Medicine

## 2016-10-27 DIAGNOSIS — X58XXXD Exposure to other specified factors, subsequent encounter: Secondary | ICD-10-CM | POA: Insufficient documentation

## 2016-10-27 DIAGNOSIS — M1990 Unspecified osteoarthritis, unspecified site: Secondary | ICD-10-CM | POA: Insufficient documentation

## 2016-10-27 DIAGNOSIS — E1140 Type 2 diabetes mellitus with diabetic neuropathy, unspecified: Secondary | ICD-10-CM | POA: Diagnosis not present

## 2016-10-27 DIAGNOSIS — E11622 Type 2 diabetes mellitus with other skin ulcer: Secondary | ICD-10-CM | POA: Insufficient documentation

## 2016-10-27 DIAGNOSIS — I890 Lymphedema, not elsewhere classified: Secondary | ICD-10-CM | POA: Diagnosis not present

## 2016-10-27 DIAGNOSIS — I110 Hypertensive heart disease with heart failure: Secondary | ICD-10-CM | POA: Insufficient documentation

## 2016-10-27 DIAGNOSIS — I87331 Chronic venous hypertension (idiopathic) with ulcer and inflammation of right lower extremity: Secondary | ICD-10-CM | POA: Diagnosis not present

## 2016-10-27 DIAGNOSIS — S31103D Unspecified open wound of abdominal wall, right lower quadrant without penetration into peritoneal cavity, subsequent encounter: Secondary | ICD-10-CM | POA: Diagnosis not present

## 2016-10-27 DIAGNOSIS — L97213 Non-pressure chronic ulcer of right calf with necrosis of muscle: Secondary | ICD-10-CM | POA: Diagnosis not present

## 2016-10-27 DIAGNOSIS — G4730 Sleep apnea, unspecified: Secondary | ICD-10-CM | POA: Insufficient documentation

## 2016-10-27 DIAGNOSIS — I509 Heart failure, unspecified: Secondary | ICD-10-CM | POA: Diagnosis not present

## 2016-10-27 DIAGNOSIS — F419 Anxiety disorder, unspecified: Secondary | ICD-10-CM | POA: Diagnosis not present

## 2016-10-27 DIAGNOSIS — M109 Gout, unspecified: Secondary | ICD-10-CM | POA: Diagnosis not present

## 2016-10-27 DIAGNOSIS — Z6841 Body Mass Index (BMI) 40.0 and over, adult: Secondary | ICD-10-CM | POA: Insufficient documentation

## 2016-10-27 DIAGNOSIS — D649 Anemia, unspecified: Secondary | ICD-10-CM | POA: Diagnosis not present

## 2016-11-09 ENCOUNTER — Ambulatory Visit (INDEPENDENT_AMBULATORY_CARE_PROVIDER_SITE_OTHER): Payer: Medicare HMO | Admitting: Orthopaedic Surgery

## 2016-11-09 ENCOUNTER — Encounter: Payer: Self-pay | Admitting: Orthopaedic Surgery

## 2016-11-09 DIAGNOSIS — M25561 Pain in right knee: Secondary | ICD-10-CM

## 2016-11-09 DIAGNOSIS — M25562 Pain in left knee: Secondary | ICD-10-CM

## 2016-11-09 DIAGNOSIS — G8929 Other chronic pain: Secondary | ICD-10-CM | POA: Diagnosis not present

## 2016-11-09 MED ORDER — OXYCODONE HCL 15 MG PO TABS: ORAL_TABLET | ORAL | 0 refills | Status: DC

## 2016-11-09 NOTE — Progress Notes (Signed)
CC: Both of my knees are hurting. I would like an injection in both knees.  The patient has had chronic pain and tenderness of both knees for some time.  Injections help.  There is no locking or giving way of the knee.  There is no new trauma. There is no redness or signs of infections.  The knees have a mild effusion and some crepitus.  There is no redness or signs of recent trauma.  Right knee ROM is 0-95 and left knee ROM is 0-90.  Impression:  Chronic pain of the both knees  Return:  1 month  PROCEDURE NOTE:  The patient requests injections of both knees, verbal consent was obtained.  The left and right knee were individually prepped appropriately after time out was performed.   Sterile technique was observed and injection of 1 cc of Depo-Medrol 40 mg with several cc's of plain xylocaine. Anesthesia was provided by ethyl chloride and a 20-gauge needle was used to inject each knee area. The injections were tolerated well.  A band aid dressing was applied.  The patient was advised to apply ice later today and tomorrow to the injection sight as needed.  I have reviewed the [place] web site prior to prescribing narcotic medicine for this patient.  Electronically Signed Sanjuana Kava, MD 3/22/20188:59 AM

## 2016-11-10 DIAGNOSIS — I87331 Chronic venous hypertension (idiopathic) with ulcer and inflammation of right lower extremity: Secondary | ICD-10-CM | POA: Diagnosis not present

## 2016-11-24 ENCOUNTER — Ambulatory Visit (HOSPITAL_BASED_OUTPATIENT_CLINIC_OR_DEPARTMENT_OTHER): Payer: Medicare HMO

## 2016-11-24 ENCOUNTER — Encounter (HOSPITAL_BASED_OUTPATIENT_CLINIC_OR_DEPARTMENT_OTHER): Payer: Medicare HMO | Attending: Internal Medicine

## 2016-11-24 DIAGNOSIS — Z6841 Body Mass Index (BMI) 40.0 and over, adult: Secondary | ICD-10-CM | POA: Diagnosis not present

## 2016-11-24 DIAGNOSIS — I87331 Chronic venous hypertension (idiopathic) with ulcer and inflammation of right lower extremity: Secondary | ICD-10-CM | POA: Insufficient documentation

## 2016-11-24 DIAGNOSIS — E1140 Type 2 diabetes mellitus with diabetic neuropathy, unspecified: Secondary | ICD-10-CM | POA: Insufficient documentation

## 2016-11-24 DIAGNOSIS — X58XXXA Exposure to other specified factors, initial encounter: Secondary | ICD-10-CM | POA: Insufficient documentation

## 2016-11-24 DIAGNOSIS — G4730 Sleep apnea, unspecified: Secondary | ICD-10-CM | POA: Insufficient documentation

## 2016-11-24 DIAGNOSIS — S31103A Unspecified open wound of abdominal wall, right lower quadrant without penetration into peritoneal cavity, initial encounter: Secondary | ICD-10-CM | POA: Diagnosis not present

## 2016-11-24 DIAGNOSIS — I10 Essential (primary) hypertension: Secondary | ICD-10-CM | POA: Insufficient documentation

## 2016-11-24 DIAGNOSIS — E11622 Type 2 diabetes mellitus with other skin ulcer: Secondary | ICD-10-CM | POA: Insufficient documentation

## 2016-11-24 DIAGNOSIS — L97812 Non-pressure chronic ulcer of other part of right lower leg with fat layer exposed: Secondary | ICD-10-CM | POA: Diagnosis not present

## 2016-12-07 ENCOUNTER — Ambulatory Visit (INDEPENDENT_AMBULATORY_CARE_PROVIDER_SITE_OTHER): Payer: Medicare HMO | Admitting: Orthopaedic Surgery

## 2016-12-07 ENCOUNTER — Encounter: Payer: Self-pay | Admitting: Orthopaedic Surgery

## 2016-12-07 DIAGNOSIS — G8929 Other chronic pain: Secondary | ICD-10-CM | POA: Diagnosis not present

## 2016-12-07 DIAGNOSIS — M25561 Pain in right knee: Secondary | ICD-10-CM

## 2016-12-07 DIAGNOSIS — M25562 Pain in left knee: Secondary | ICD-10-CM

## 2016-12-07 MED ORDER — OXYCODONE HCL 15 MG PO TABS: ORAL_TABLET | ORAL | 0 refills | Status: DC

## 2016-12-07 NOTE — Progress Notes (Signed)
CC: Both of my knees are hurting. I would like an injection in both knees.  The patient has had chronic pain and tenderness of both knees for some time.  Injections help.  There is no locking or giving way of the knee.  There is no new trauma. There is no redness or signs of infections.  The knees have a mild effusion and some crepitus.  There is no redness or signs of recent trauma.  Right knee ROM is 0-90 and left knee ROM is 0-95.  Impression:  Chronic pain of the both knees  Return:  1 month  PROCEDURE NOTE:  The patient requests injections of both knees, verbal consent was obtained.  The left and right knee were individually prepped appropriately after time out was performed.   Sterile technique was observed and injection of 1 cc of Depo-Medrol 40 mg with several cc's of plain xylocaine. Anesthesia was provided by ethyl chloride and a 20-gauge needle was used to inject each knee area. The injections were tolerated well.  A band aid dressing was applied.  The patient was advised to apply ice later today and tomorrow to the injection sight as needed.   Electronically Signed Sanjuana Kava, MD 4/19/20189:03 AM

## 2016-12-08 DIAGNOSIS — I87331 Chronic venous hypertension (idiopathic) with ulcer and inflammation of right lower extremity: Secondary | ICD-10-CM | POA: Diagnosis not present

## 2016-12-22 ENCOUNTER — Encounter (HOSPITAL_BASED_OUTPATIENT_CLINIC_OR_DEPARTMENT_OTHER): Payer: Medicare HMO | Attending: Internal Medicine

## 2016-12-22 DIAGNOSIS — Z6841 Body Mass Index (BMI) 40.0 and over, adult: Secondary | ICD-10-CM | POA: Insufficient documentation

## 2016-12-22 DIAGNOSIS — E1140 Type 2 diabetes mellitus with diabetic neuropathy, unspecified: Secondary | ICD-10-CM | POA: Insufficient documentation

## 2016-12-22 DIAGNOSIS — G4730 Sleep apnea, unspecified: Secondary | ICD-10-CM | POA: Insufficient documentation

## 2016-12-22 DIAGNOSIS — D649 Anemia, unspecified: Secondary | ICD-10-CM | POA: Diagnosis not present

## 2016-12-22 DIAGNOSIS — M109 Gout, unspecified: Secondary | ICD-10-CM | POA: Insufficient documentation

## 2016-12-22 DIAGNOSIS — I509 Heart failure, unspecified: Secondary | ICD-10-CM | POA: Insufficient documentation

## 2016-12-22 DIAGNOSIS — E11622 Type 2 diabetes mellitus with other skin ulcer: Secondary | ICD-10-CM | POA: Diagnosis not present

## 2016-12-22 DIAGNOSIS — I87331 Chronic venous hypertension (idiopathic) with ulcer and inflammation of right lower extremity: Secondary | ICD-10-CM | POA: Diagnosis not present

## 2016-12-22 DIAGNOSIS — I110 Hypertensive heart disease with heart failure: Secondary | ICD-10-CM | POA: Insufficient documentation

## 2016-12-22 DIAGNOSIS — F411 Generalized anxiety disorder: Secondary | ICD-10-CM | POA: Insufficient documentation

## 2016-12-22 DIAGNOSIS — X58XXXD Exposure to other specified factors, subsequent encounter: Secondary | ICD-10-CM | POA: Insufficient documentation

## 2016-12-22 DIAGNOSIS — L97213 Non-pressure chronic ulcer of right calf with necrosis of muscle: Secondary | ICD-10-CM | POA: Diagnosis not present

## 2016-12-22 DIAGNOSIS — S31103D Unspecified open wound of abdominal wall, right lower quadrant without penetration into peritoneal cavity, subsequent encounter: Secondary | ICD-10-CM | POA: Insufficient documentation

## 2016-12-22 DIAGNOSIS — M1990 Unspecified osteoarthritis, unspecified site: Secondary | ICD-10-CM | POA: Diagnosis not present

## 2017-01-04 ENCOUNTER — Ambulatory Visit (INDEPENDENT_AMBULATORY_CARE_PROVIDER_SITE_OTHER): Payer: Medicare HMO | Admitting: Orthopaedic Surgery

## 2017-01-04 DIAGNOSIS — G8929 Other chronic pain: Secondary | ICD-10-CM | POA: Diagnosis not present

## 2017-01-04 DIAGNOSIS — M25561 Pain in right knee: Secondary | ICD-10-CM

## 2017-01-04 DIAGNOSIS — M25562 Pain in left knee: Secondary | ICD-10-CM | POA: Diagnosis not present

## 2017-01-04 MED ORDER — OXYCODONE HCL 15 MG PO TABS: ORAL_TABLET | ORAL | 0 refills | Status: DC

## 2017-01-04 NOTE — Progress Notes (Signed)
CC: Both of my knees are hurting. I would like an injection in both knees.  The patient has had chronic pain and tenderness of both knees for some time.  Injections help.  There is no locking or giving way of the knee.  There is no new trauma. There is no redness or signs of infections.  The knees have a mild effusion and some crepitus.  There is no redness or signs of recent trauma.  Right knee ROM is 0-95 and left knee ROM is 0-90.  Impression:  Chronic pain of the both knees  Return:  1 month  PROCEDURE NOTE:  The patient requests injections of both knees, verbal consent was obtained.  The left and right knee were individually prepped appropriately after time out was performed.   Sterile technique was observed and injection of 1 cc of Depo-Medrol 40 mg with several cc's of plain xylocaine. Anesthesia was provided by ethyl chloride and a 20-gauge needle was used to inject each knee area. The injections were tolerated well.  A band aid dressing was applied.  The patient was advised to apply ice later today and tomorrow to the injection sight as needed.  I have reviewed the [place] web site prior to prescribing narcotic medicine for this patient.  Electronically Signed Sanjuana Kava, MD 5/17/20181:27 PM

## 2017-01-05 DIAGNOSIS — I87331 Chronic venous hypertension (idiopathic) with ulcer and inflammation of right lower extremity: Secondary | ICD-10-CM | POA: Diagnosis not present

## 2017-01-19 ENCOUNTER — Encounter (HOSPITAL_BASED_OUTPATIENT_CLINIC_OR_DEPARTMENT_OTHER): Payer: Medicare HMO | Attending: Internal Medicine

## 2017-01-19 DIAGNOSIS — L89892 Pressure ulcer of other site, stage 2: Secondary | ICD-10-CM | POA: Diagnosis not present

## 2017-01-19 DIAGNOSIS — L97812 Non-pressure chronic ulcer of other part of right lower leg with fat layer exposed: Secondary | ICD-10-CM | POA: Diagnosis not present

## 2017-01-19 DIAGNOSIS — S31103A Unspecified open wound of abdominal wall, right lower quadrant without penetration into peritoneal cavity, initial encounter: Secondary | ICD-10-CM | POA: Diagnosis not present

## 2017-01-19 DIAGNOSIS — E11622 Type 2 diabetes mellitus with other skin ulcer: Secondary | ICD-10-CM | POA: Diagnosis not present

## 2017-01-19 DIAGNOSIS — X58XXXA Exposure to other specified factors, initial encounter: Secondary | ICD-10-CM | POA: Diagnosis not present

## 2017-01-19 DIAGNOSIS — Z6841 Body Mass Index (BMI) 40.0 and over, adult: Secondary | ICD-10-CM | POA: Diagnosis not present

## 2017-01-19 DIAGNOSIS — I87331 Chronic venous hypertension (idiopathic) with ulcer and inflammation of right lower extremity: Secondary | ICD-10-CM | POA: Insufficient documentation

## 2017-01-22 ENCOUNTER — Telehealth: Payer: Self-pay | Admitting: Orthopaedic Surgery

## 2017-01-22 NOTE — Telephone Encounter (Signed)
Meloxicam 15 mg  Qty 30 Tablets  Refills

## 2017-01-23 MED ORDER — MELOXICAM 15 MG PO TABS: 15.0000 mg | ORAL_TABLET | Freq: Every day | ORAL | 5 refills | Status: DC

## 2017-01-30 ENCOUNTER — Encounter: Payer: Self-pay | Admitting: Orthopaedic Surgery

## 2017-01-30 ENCOUNTER — Ambulatory Visit (INDEPENDENT_AMBULATORY_CARE_PROVIDER_SITE_OTHER): Payer: Medicare HMO | Admitting: Orthopaedic Surgery

## 2017-01-30 DIAGNOSIS — G8929 Other chronic pain: Secondary | ICD-10-CM | POA: Diagnosis not present

## 2017-01-30 DIAGNOSIS — M25561 Pain in right knee: Secondary | ICD-10-CM

## 2017-01-30 DIAGNOSIS — M25562 Pain in left knee: Secondary | ICD-10-CM | POA: Diagnosis not present

## 2017-01-30 MED ORDER — OXYCODONE HCL 15 MG PO TABS: ORAL_TABLET | ORAL | 0 refills | Status: DC

## 2017-01-30 NOTE — Progress Notes (Signed)
CC: Both of my knees are hurting. I would like an injection in both knees.  The patient has had chronic pain and tenderness of both knees for some time.  Injections help.  There is no locking or giving way of the knee.  There is no new trauma. There is no redness or signs of infections.  The knees have a mild effusion and some crepitus.  There is no redness or signs of recent trauma.  Right knee ROM is 0-95 and left knee ROM is 0-100.  Impression:  Chronic pain of the both knees  Return:  1 month  PROCEDURE NOTE:  The patient requests injections of both knees, verbal consent was obtained.  The left and right knee were individually prepped appropriately after time out was performed.   Sterile technique was observed and injection of 1 cc of Depo-Medrol 40 mg with several cc's of plain xylocaine. Anesthesia was provided by ethyl chloride and a 20-gauge needle was used to inject each knee area. The injections were tolerated well.  A band aid dressing was applied.  The patient was advised to apply ice later today and tomorrow to the injection sight as needed.  I have reviewed the [place] web site prior to prescribing narcotic medicine for this patient.  Electronically Signed Sanjuana Kava, MD 6/12/201810:20 AM

## 2017-02-02 DIAGNOSIS — I87331 Chronic venous hypertension (idiopathic) with ulcer and inflammation of right lower extremity: Secondary | ICD-10-CM | POA: Diagnosis not present

## 2017-02-15 ENCOUNTER — Telehealth: Payer: Self-pay | Admitting: Orthopaedic Surgery

## 2017-02-15 MED ORDER — MELOXICAM 15 MG PO TABS: 15.0000 mg | ORAL_TABLET | Freq: Every day | ORAL | 5 refills | Status: DC

## 2017-02-15 NOTE — Telephone Encounter (Signed)
Meloxicam 15 mg  Qty 30 Tablets  Patient has changed pharmacy.  [facility] now uses [facility] .  The one that was faxed to [facility] was not filled per [facility], they filed it.

## 2017-02-16 DIAGNOSIS — I87331 Chronic venous hypertension (idiopathic) with ulcer and inflammation of right lower extremity: Secondary | ICD-10-CM | POA: Diagnosis not present

## 2017-02-22 DIAGNOSIS — I152 Hypertension secondary to endocrine disorders: Secondary | ICD-10-CM | POA: Insufficient documentation

## 2017-02-22 DIAGNOSIS — E1159 Type 2 diabetes mellitus with other circulatory complications: Secondary | ICD-10-CM | POA: Insufficient documentation

## 2017-02-27 ENCOUNTER — Ambulatory Visit: Payer: Medicare HMO | Admitting: Orthopaedic Surgery

## 2017-03-01 ENCOUNTER — Ambulatory Visit (INDEPENDENT_AMBULATORY_CARE_PROVIDER_SITE_OTHER): Payer: Medicare HMO | Admitting: Orthopaedic Surgery

## 2017-03-01 ENCOUNTER — Encounter: Payer: Self-pay | Admitting: Orthopaedic Surgery

## 2017-03-01 DIAGNOSIS — M25562 Pain in left knee: Secondary | ICD-10-CM | POA: Diagnosis not present

## 2017-03-01 DIAGNOSIS — G8929 Other chronic pain: Secondary | ICD-10-CM

## 2017-03-01 DIAGNOSIS — M25561 Pain in right knee: Secondary | ICD-10-CM | POA: Diagnosis not present

## 2017-03-01 MED ORDER — OXYCODONE HCL 15 MG PO TABS: ORAL_TABLET | ORAL | 0 refills | Status: DC

## 2017-03-01 NOTE — Progress Notes (Signed)
CC: Both of my knees are hurting. I would like an injection in both knees.  The patient has had chronic pain and tenderness of both knees for some time.  Injections help.  There is no locking or giving way of the knee.  There is no new trauma. There is no redness or signs of infections.  The knees have a mild effusion and some crepitus.  There is no redness or signs of recent trauma.  Right knee ROM is 0-95 and left knee ROM is 0-90.  Impression:  Chronic pain of the both knees  Return:  1 month  PROCEDURE NOTE:  The patient requests injections of both knees, verbal consent was obtained.  The left and right knee were individually prepped appropriately after time out was performed.   Sterile technique was observed and injection of 1 cc of Depo-Medrol 40 mg with several cc's of plain xylocaine. Anesthesia was provided by ethyl chloride and a 20-gauge needle was used to inject each knee area. The injections were tolerated well.  A band aid dressing was applied.  The patient was advised to apply ice later today and tomorrow to the injection sight as needed.  I have reviewed the [place] web site prior to prescribing narcotic medicine for this patient.  Electronically Signed Sanjuana Kava, MD 7/12/20188:23 AM

## 2017-03-02 ENCOUNTER — Encounter (HOSPITAL_BASED_OUTPATIENT_CLINIC_OR_DEPARTMENT_OTHER): Payer: Medicare HMO | Attending: Internal Medicine

## 2017-03-02 DIAGNOSIS — I10 Essential (primary) hypertension: Secondary | ICD-10-CM | POA: Insufficient documentation

## 2017-03-02 DIAGNOSIS — Z8614 Personal history of Methicillin resistant Staphylococcus aureus infection: Secondary | ICD-10-CM | POA: Insufficient documentation

## 2017-03-02 DIAGNOSIS — X58XXXA Exposure to other specified factors, initial encounter: Secondary | ICD-10-CM | POA: Insufficient documentation

## 2017-03-02 DIAGNOSIS — L97812 Non-pressure chronic ulcer of other part of right lower leg with fat layer exposed: Secondary | ICD-10-CM | POA: Insufficient documentation

## 2017-03-02 DIAGNOSIS — L97112 Non-pressure chronic ulcer of right thigh with fat layer exposed: Secondary | ICD-10-CM | POA: Insufficient documentation

## 2017-03-02 DIAGNOSIS — Z6841 Body Mass Index (BMI) 40.0 and over, adult: Secondary | ICD-10-CM | POA: Insufficient documentation

## 2017-03-02 DIAGNOSIS — G4730 Sleep apnea, unspecified: Secondary | ICD-10-CM | POA: Insufficient documentation

## 2017-03-02 DIAGNOSIS — S31103A Unspecified open wound of abdominal wall, right lower quadrant without penetration into peritoneal cavity, initial encounter: Secondary | ICD-10-CM | POA: Insufficient documentation

## 2017-03-02 DIAGNOSIS — I87331 Chronic venous hypertension (idiopathic) with ulcer and inflammation of right lower extremity: Secondary | ICD-10-CM | POA: Insufficient documentation

## 2017-03-02 DIAGNOSIS — E1140 Type 2 diabetes mellitus with diabetic neuropathy, unspecified: Secondary | ICD-10-CM | POA: Insufficient documentation

## 2017-03-09 DIAGNOSIS — X58XXXA Exposure to other specified factors, initial encounter: Secondary | ICD-10-CM | POA: Diagnosis not present

## 2017-03-09 DIAGNOSIS — Z8614 Personal history of Methicillin resistant Staphylococcus aureus infection: Secondary | ICD-10-CM | POA: Diagnosis not present

## 2017-03-09 DIAGNOSIS — L97112 Non-pressure chronic ulcer of right thigh with fat layer exposed: Secondary | ICD-10-CM | POA: Diagnosis not present

## 2017-03-09 DIAGNOSIS — L97812 Non-pressure chronic ulcer of other part of right lower leg with fat layer exposed: Secondary | ICD-10-CM | POA: Diagnosis not present

## 2017-03-09 DIAGNOSIS — G4730 Sleep apnea, unspecified: Secondary | ICD-10-CM | POA: Diagnosis not present

## 2017-03-09 DIAGNOSIS — E1140 Type 2 diabetes mellitus with diabetic neuropathy, unspecified: Secondary | ICD-10-CM | POA: Diagnosis not present

## 2017-03-09 DIAGNOSIS — Z6841 Body Mass Index (BMI) 40.0 and over, adult: Secondary | ICD-10-CM | POA: Diagnosis not present

## 2017-03-09 DIAGNOSIS — I87331 Chronic venous hypertension (idiopathic) with ulcer and inflammation of right lower extremity: Secondary | ICD-10-CM | POA: Diagnosis present

## 2017-03-09 DIAGNOSIS — I10 Essential (primary) hypertension: Secondary | ICD-10-CM | POA: Diagnosis not present

## 2017-03-09 DIAGNOSIS — S31103A Unspecified open wound of abdominal wall, right lower quadrant without penetration into peritoneal cavity, initial encounter: Secondary | ICD-10-CM | POA: Diagnosis not present

## 2017-03-29 ENCOUNTER — Ambulatory Visit (INDEPENDENT_AMBULATORY_CARE_PROVIDER_SITE_OTHER): Payer: Medicare HMO | Admitting: Orthopaedic Surgery

## 2017-03-29 ENCOUNTER — Encounter: Payer: Self-pay | Admitting: Orthopaedic Surgery

## 2017-03-29 DIAGNOSIS — G8929 Other chronic pain: Secondary | ICD-10-CM | POA: Diagnosis not present

## 2017-03-29 DIAGNOSIS — M25561 Pain in right knee: Secondary | ICD-10-CM | POA: Diagnosis not present

## 2017-03-29 DIAGNOSIS — M25562 Pain in left knee: Secondary | ICD-10-CM | POA: Diagnosis not present

## 2017-03-29 MED ORDER — OXYCODONE HCL 15 MG PO TABS: ORAL_TABLET | ORAL | 0 refills | Status: DC

## 2017-03-29 NOTE — Progress Notes (Signed)
CC: Both of my knees are hurting. I would like an injection in both knees.  The patient has had chronic pain and tenderness of both knees for some time.  Injections help.  There is no locking or giving way of the knee.  There is no new trauma. There is no redness or signs of infections.  The knees have a mild effusion and some crepitus.  There is no redness or signs of recent trauma.  Right knee ROM is 0-90 and left knee ROM is 0-95.  Impression:  Chronic pain of the both knees  Return:  1 month  PROCEDURE NOTE:  The patient requests injections of both knees, verbal consent was obtained.  The left and right knee were individually prepped appropriately after time out was performed.   Sterile technique was observed and injection of 1 cc of Depo-Medrol 40 mg with several cc's of plain xylocaine. Anesthesia was provided by ethyl chloride and a 20-gauge needle was used to inject each knee area. The injections were tolerated well.  A band aid dressing was applied.  The patient was advised to apply ice later today and tomorrow to the injection sight as needed.   I have reviewed the [place] web site prior to prescribing narcotic medicine for this patient. Electronically Signed Sanjuana Kava, MD 8/9/20188:34 AM

## 2017-04-06 ENCOUNTER — Encounter (HOSPITAL_BASED_OUTPATIENT_CLINIC_OR_DEPARTMENT_OTHER): Payer: Medicare HMO | Attending: Internal Medicine

## 2017-04-06 DIAGNOSIS — E11622 Type 2 diabetes mellitus with other skin ulcer: Secondary | ICD-10-CM | POA: Insufficient documentation

## 2017-04-06 DIAGNOSIS — I87331 Chronic venous hypertension (idiopathic) with ulcer and inflammation of right lower extremity: Secondary | ICD-10-CM | POA: Diagnosis present

## 2017-04-06 DIAGNOSIS — L97812 Non-pressure chronic ulcer of other part of right lower leg with fat layer exposed: Secondary | ICD-10-CM | POA: Insufficient documentation

## 2017-04-06 DIAGNOSIS — I10 Essential (primary) hypertension: Secondary | ICD-10-CM | POA: Insufficient documentation

## 2017-04-06 DIAGNOSIS — S31103A Unspecified open wound of abdominal wall, right lower quadrant without penetration into peritoneal cavity, initial encounter: Secondary | ICD-10-CM | POA: Diagnosis not present

## 2017-04-06 DIAGNOSIS — X58XXXA Exposure to other specified factors, initial encounter: Secondary | ICD-10-CM | POA: Insufficient documentation

## 2017-04-06 DIAGNOSIS — L89892 Pressure ulcer of other site, stage 2: Secondary | ICD-10-CM | POA: Diagnosis not present

## 2017-04-06 DIAGNOSIS — E1140 Type 2 diabetes mellitus with diabetic neuropathy, unspecified: Secondary | ICD-10-CM | POA: Diagnosis not present

## 2017-04-06 DIAGNOSIS — G4730 Sleep apnea, unspecified: Secondary | ICD-10-CM | POA: Diagnosis not present

## 2017-04-06 DIAGNOSIS — Z6841 Body Mass Index (BMI) 40.0 and over, adult: Secondary | ICD-10-CM | POA: Insufficient documentation

## 2017-04-26 ENCOUNTER — Ambulatory Visit (INDEPENDENT_AMBULATORY_CARE_PROVIDER_SITE_OTHER): Payer: Medicare HMO | Admitting: Orthopaedic Surgery

## 2017-04-26 DIAGNOSIS — M25561 Pain in right knee: Secondary | ICD-10-CM | POA: Diagnosis not present

## 2017-04-26 DIAGNOSIS — G8929 Other chronic pain: Secondary | ICD-10-CM | POA: Diagnosis not present

## 2017-04-26 DIAGNOSIS — M25562 Pain in left knee: Secondary | ICD-10-CM | POA: Diagnosis not present

## 2017-04-26 MED ORDER — OXYCODONE HCL 15 MG PO TABS: ORAL_TABLET | ORAL | 0 refills | Status: DC

## 2017-04-26 NOTE — Progress Notes (Signed)
CC: Both of my knees are hurting. I would like an injection in both knees.  The patient has had chronic pain and tenderness of both knees for some time.  Injections help.  There is no locking or giving way of the knee.  There is no new trauma. There is no redness or signs of infections.  The knees have a mild effusion and some crepitus.  There is no redness or signs of recent trauma.  Right knee ROM is 0-100 and left knee ROM is 0-90.  Impression:  Chronic pain of the both knees  Return:  1 month  PROCEDURE NOTE:  The patient requests injections of both knees, verbal consent was obtained.  The left and right knee were individually prepped appropriately after time out was performed.   Sterile technique was observed and injection of 1 cc of Depo-Medrol 40 mg with several cc's of plain xylocaine. Anesthesia was provided by ethyl chloride and a 20-gauge needle was used to inject each knee area. The injections were tolerated well.  A band aid dressing was applied.  The patient was advised to apply ice later today and tomorrow to the injection sight as needed.   I have reviewed the [place] web site prior to prescribing narcotic medicine for this patient.  Electronically Signed Sanjuana Kava, MD 9/6/20188:57 AM

## 2017-04-27 ENCOUNTER — Encounter (HOSPITAL_BASED_OUTPATIENT_CLINIC_OR_DEPARTMENT_OTHER): Payer: Medicare HMO | Attending: Internal Medicine

## 2017-04-27 DIAGNOSIS — L89892 Pressure ulcer of other site, stage 2: Secondary | ICD-10-CM | POA: Insufficient documentation

## 2017-04-27 DIAGNOSIS — L97812 Non-pressure chronic ulcer of other part of right lower leg with fat layer exposed: Secondary | ICD-10-CM | POA: Insufficient documentation

## 2017-04-27 DIAGNOSIS — E1140 Type 2 diabetes mellitus with diabetic neuropathy, unspecified: Secondary | ICD-10-CM | POA: Diagnosis not present

## 2017-04-27 DIAGNOSIS — E1151 Type 2 diabetes mellitus with diabetic peripheral angiopathy without gangrene: Secondary | ICD-10-CM | POA: Insufficient documentation

## 2017-04-27 DIAGNOSIS — I87331 Chronic venous hypertension (idiopathic) with ulcer and inflammation of right lower extremity: Secondary | ICD-10-CM | POA: Insufficient documentation

## 2017-04-27 DIAGNOSIS — I110 Hypertensive heart disease with heart failure: Secondary | ICD-10-CM | POA: Insufficient documentation

## 2017-04-27 DIAGNOSIS — E11622 Type 2 diabetes mellitus with other skin ulcer: Secondary | ICD-10-CM | POA: Diagnosis not present

## 2017-04-27 DIAGNOSIS — S31109A Unspecified open wound of abdominal wall, unspecified quadrant without penetration into peritoneal cavity, initial encounter: Secondary | ICD-10-CM | POA: Insufficient documentation

## 2017-04-27 DIAGNOSIS — X58XXXA Exposure to other specified factors, initial encounter: Secondary | ICD-10-CM | POA: Diagnosis not present

## 2017-04-27 DIAGNOSIS — I509 Heart failure, unspecified: Secondary | ICD-10-CM | POA: Diagnosis not present

## 2017-04-27 DIAGNOSIS — Z6841 Body Mass Index (BMI) 40.0 and over, adult: Secondary | ICD-10-CM | POA: Insufficient documentation

## 2017-05-24 ENCOUNTER — Encounter: Payer: Self-pay | Admitting: Orthopaedic Surgery

## 2017-05-24 ENCOUNTER — Ambulatory Visit (INDEPENDENT_AMBULATORY_CARE_PROVIDER_SITE_OTHER): Payer: Medicare HMO | Admitting: Orthopaedic Surgery

## 2017-05-24 DIAGNOSIS — M25562 Pain in left knee: Secondary | ICD-10-CM | POA: Diagnosis not present

## 2017-05-24 DIAGNOSIS — G8929 Other chronic pain: Secondary | ICD-10-CM

## 2017-05-24 DIAGNOSIS — M25561 Pain in right knee: Secondary | ICD-10-CM | POA: Diagnosis not present

## 2017-05-24 MED ORDER — OXYCODONE HCL 15 MG PO TABS: ORAL_TABLET | ORAL | 0 refills | Status: DC

## 2017-05-24 NOTE — Progress Notes (Signed)
CC: Both of my knees are hurting. I would like an injection in both knees.  The patient has had chronic pain and tenderness of both knees for some time.  Injections help.  There is no locking or giving way of the knee.  There is no new trauma. There is no redness or signs of infections.  The knees have a mild effusion and some crepitus.  There is no redness or signs of recent trauma.  Right knee ROM is 0-90 and left knee ROM is 0-95.  Impression:  Chronic pain of the both knees  Return:  1 month  PROCEDURE NOTE:  The patient requests injections of both knees, verbal consent was obtained.  The left and right knee were individually prepped appropriately after time out was performed.   Sterile technique was observed and injection of 1 cc of Depo-Medrol 40 mg with several cc's of plain xylocaine. Anesthesia was provided by ethyl chloride and a 20-gauge needle was used to inject each knee area. The injections were tolerated well.  A band aid dressing was applied.  The patient was advised to apply ice later today and tomorrow to the injection sight as needed.  I have reviewed the [place] web site prior to prescribing narcotic medicine for this patient.  Electronically Signed Sanjuana Kava, MD 10/4/20188:23 AM

## 2017-06-01 ENCOUNTER — Encounter (HOSPITAL_BASED_OUTPATIENT_CLINIC_OR_DEPARTMENT_OTHER): Payer: Medicare HMO | Attending: Internal Medicine

## 2017-06-01 DIAGNOSIS — X58XXXA Exposure to other specified factors, initial encounter: Secondary | ICD-10-CM | POA: Insufficient documentation

## 2017-06-01 DIAGNOSIS — I87331 Chronic venous hypertension (idiopathic) with ulcer and inflammation of right lower extremity: Secondary | ICD-10-CM | POA: Diagnosis present

## 2017-06-01 DIAGNOSIS — S31109A Unspecified open wound of abdominal wall, unspecified quadrant without penetration into peritoneal cavity, initial encounter: Secondary | ICD-10-CM | POA: Insufficient documentation

## 2017-06-01 DIAGNOSIS — L89892 Pressure ulcer of other site, stage 2: Secondary | ICD-10-CM | POA: Insufficient documentation

## 2017-06-01 DIAGNOSIS — L97812 Non-pressure chronic ulcer of other part of right lower leg with fat layer exposed: Secondary | ICD-10-CM | POA: Diagnosis not present

## 2017-06-01 DIAGNOSIS — E1140 Type 2 diabetes mellitus with diabetic neuropathy, unspecified: Secondary | ICD-10-CM | POA: Diagnosis not present

## 2017-06-01 DIAGNOSIS — G4730 Sleep apnea, unspecified: Secondary | ICD-10-CM | POA: Diagnosis not present

## 2017-06-01 DIAGNOSIS — I10 Essential (primary) hypertension: Secondary | ICD-10-CM | POA: Insufficient documentation

## 2017-06-15 DIAGNOSIS — I87331 Chronic venous hypertension (idiopathic) with ulcer and inflammation of right lower extremity: Secondary | ICD-10-CM | POA: Diagnosis not present

## 2017-06-19 DIAGNOSIS — G4733 Obstructive sleep apnea (adult) (pediatric): Secondary | ICD-10-CM | POA: Insufficient documentation

## 2017-06-21 ENCOUNTER — Ambulatory Visit (INDEPENDENT_AMBULATORY_CARE_PROVIDER_SITE_OTHER): Payer: Medicare HMO | Admitting: Orthopaedic Surgery

## 2017-06-21 DIAGNOSIS — G8929 Other chronic pain: Secondary | ICD-10-CM

## 2017-06-21 DIAGNOSIS — M25562 Pain in left knee: Secondary | ICD-10-CM | POA: Diagnosis not present

## 2017-06-21 DIAGNOSIS — M25561 Pain in right knee: Secondary | ICD-10-CM

## 2017-06-21 MED ORDER — OXYCODONE HCL 15 MG PO TABS: ORAL_TABLET | ORAL | 0 refills | Status: DC

## 2017-06-21 NOTE — Progress Notes (Signed)
CC: Both of my knees are hurting. I would like an injection in both knees.  The patient has had chronic pain and tenderness of both knees for some time.  Injections help.  There is no locking or giving way of the knee.  There is no new trauma. There is no redness or signs of infections.  The knees have a mild effusion and some crepitus.  There is no redness or signs of recent trauma.  Right knee ROM is 0-90 and left knee ROM is 0-95.  Impression:  Chronic pain of the both knees  Return:  1 month  PROCEDURE NOTE:  The patient requests injections of both knees, verbal consent was obtained.  The left and right knee were individually prepped appropriately after time out was performed.   Sterile technique was observed and injection of 1 cc of Depo-Medrol 40 mg with several cc's of plain xylocaine. Anesthesia was provided by ethyl chloride and a 20-gauge needle was used to inject each knee area. The injections were tolerated well.  A band aid dressing was applied.  The patient was advised to apply ice later today and tomorrow to the injection sight as needed.  I have reviewed the [place] web site prior to prescribing narcotic medicine for this patient.  Electronically [place], MD 11/1/20189:04 AM

## 2017-06-29 ENCOUNTER — Encounter (HOSPITAL_BASED_OUTPATIENT_CLINIC_OR_DEPARTMENT_OTHER): Payer: Medicare HMO | Attending: Internal Medicine

## 2017-06-29 DIAGNOSIS — X58XXXA Exposure to other specified factors, initial encounter: Secondary | ICD-10-CM | POA: Insufficient documentation

## 2017-06-29 DIAGNOSIS — I890 Lymphedema, not elsewhere classified: Secondary | ICD-10-CM | POA: Diagnosis present

## 2017-06-29 DIAGNOSIS — I87331 Chronic venous hypertension (idiopathic) with ulcer and inflammation of right lower extremity: Secondary | ICD-10-CM | POA: Insufficient documentation

## 2017-06-29 DIAGNOSIS — L97812 Non-pressure chronic ulcer of other part of right lower leg with fat layer exposed: Secondary | ICD-10-CM | POA: Diagnosis not present

## 2017-06-29 DIAGNOSIS — S31103A Unspecified open wound of abdominal wall, right lower quadrant without penetration into peritoneal cavity, initial encounter: Secondary | ICD-10-CM | POA: Insufficient documentation

## 2017-06-29 DIAGNOSIS — L97112 Non-pressure chronic ulcer of right thigh with fat layer exposed: Secondary | ICD-10-CM | POA: Insufficient documentation

## 2017-06-29 DIAGNOSIS — G4730 Sleep apnea, unspecified: Secondary | ICD-10-CM | POA: Diagnosis not present

## 2017-06-29 DIAGNOSIS — I10 Essential (primary) hypertension: Secondary | ICD-10-CM | POA: Insufficient documentation

## 2017-06-29 DIAGNOSIS — Z6841 Body Mass Index (BMI) 40.0 and over, adult: Secondary | ICD-10-CM | POA: Diagnosis not present

## 2017-06-29 DIAGNOSIS — E1140 Type 2 diabetes mellitus with diabetic neuropathy, unspecified: Secondary | ICD-10-CM | POA: Diagnosis not present

## 2017-07-18 ENCOUNTER — Encounter: Payer: Self-pay | Admitting: Orthopaedic Surgery

## 2017-07-18 ENCOUNTER — Ambulatory Visit: Payer: Medicare HMO | Admitting: Orthopaedic Surgery

## 2017-07-18 DIAGNOSIS — M25561 Pain in right knee: Secondary | ICD-10-CM

## 2017-07-18 DIAGNOSIS — M25562 Pain in left knee: Secondary | ICD-10-CM | POA: Diagnosis not present

## 2017-07-18 DIAGNOSIS — G8929 Other chronic pain: Secondary | ICD-10-CM | POA: Diagnosis not present

## 2017-07-18 MED ORDER — OXYCODONE HCL 15 MG PO TABS: ORAL_TABLET | ORAL | 0 refills | Status: DC

## 2017-07-18 NOTE — Progress Notes (Signed)
CC: Both of my knees are hurting. I would like an injection in both knees.  The patient has had chronic pain and tenderness of both knees for some time.  Injections help.  There is no locking or giving way of the knee.  There is no new trauma. There is no redness or signs of infections.  The knees have a mild effusion and some crepitus.  There is no redness or signs of recent trauma.  Right knee ROM is 0-95 and left knee ROM is 0-90.  Impression:  Chronic pain of the both knees  Return:  1 month  PROCEDURE NOTE:  The patient requests injections of both knees, verbal consent was obtained.  The left and right knee were individually prepped appropriately after time out was performed.   Sterile technique was observed and injection of 1 cc of Depo-Medrol 40 mg with several cc's of plain xylocaine. Anesthesia was provided by ethyl chloride and a 20-gauge needle was used to inject each knee area. The injections were tolerated well.  A band aid dressing was applied.  The patient was advised to apply ice later today and tomorrow to the injection sight as needed.  I have reviewed the [place] web site prior to prescribing narcotic medicine for this patient.  Electronically Signed Sanjuana Kava, MD 11/28/20188:49 AM

## 2017-07-19 ENCOUNTER — Ambulatory Visit: Payer: Medicare HMO | Admitting: Orthopaedic Surgery

## 2017-07-27 ENCOUNTER — Encounter (HOSPITAL_BASED_OUTPATIENT_CLINIC_OR_DEPARTMENT_OTHER): Payer: Medicare HMO | Attending: Internal Medicine

## 2017-07-27 DIAGNOSIS — S31103A Unspecified open wound of abdominal wall, right lower quadrant without penetration into peritoneal cavity, initial encounter: Secondary | ICD-10-CM | POA: Diagnosis not present

## 2017-07-27 DIAGNOSIS — Z6841 Body Mass Index (BMI) 40.0 and over, adult: Secondary | ICD-10-CM | POA: Diagnosis not present

## 2017-07-27 DIAGNOSIS — X58XXXA Exposure to other specified factors, initial encounter: Secondary | ICD-10-CM | POA: Diagnosis not present

## 2017-07-27 DIAGNOSIS — G4730 Sleep apnea, unspecified: Secondary | ICD-10-CM | POA: Diagnosis not present

## 2017-07-27 DIAGNOSIS — I87331 Chronic venous hypertension (idiopathic) with ulcer and inflammation of right lower extremity: Secondary | ICD-10-CM | POA: Diagnosis present

## 2017-07-27 DIAGNOSIS — L97812 Non-pressure chronic ulcer of other part of right lower leg with fat layer exposed: Secondary | ICD-10-CM | POA: Insufficient documentation

## 2017-07-27 DIAGNOSIS — L89892 Pressure ulcer of other site, stage 2: Secondary | ICD-10-CM | POA: Insufficient documentation

## 2017-07-27 DIAGNOSIS — E1140 Type 2 diabetes mellitus with diabetic neuropathy, unspecified: Secondary | ICD-10-CM | POA: Diagnosis not present

## 2017-07-27 DIAGNOSIS — I10 Essential (primary) hypertension: Secondary | ICD-10-CM | POA: Insufficient documentation

## 2017-07-27 DIAGNOSIS — E11622 Type 2 diabetes mellitus with other skin ulcer: Secondary | ICD-10-CM | POA: Diagnosis not present

## 2017-08-10 DIAGNOSIS — I87331 Chronic venous hypertension (idiopathic) with ulcer and inflammation of right lower extremity: Secondary | ICD-10-CM | POA: Diagnosis not present

## 2017-08-22 ENCOUNTER — Encounter: Payer: Self-pay | Admitting: Orthopaedic Surgery

## 2017-08-22 ENCOUNTER — Ambulatory Visit: Payer: Medicare HMO | Admitting: Orthopaedic Surgery

## 2017-08-22 DIAGNOSIS — M25562 Pain in left knee: Secondary | ICD-10-CM

## 2017-08-22 DIAGNOSIS — M25561 Pain in right knee: Secondary | ICD-10-CM

## 2017-08-22 DIAGNOSIS — G8929 Other chronic pain: Secondary | ICD-10-CM | POA: Diagnosis not present

## 2017-08-22 MED ORDER — OXYCODONE HCL 15 MG PO TABS: ORAL_TABLET | ORAL | 0 refills | Status: DC

## 2017-08-22 NOTE — Progress Notes (Signed)
CC: Both of my knees are hurting. I would like an injection in both knees.  The patient has had chronic pain and tenderness of both knees for some time.  Injections help.  There is no locking or giving way of the knee.  There is no new trauma. There is no redness or signs of infections.  The knees have a mild effusion and some crepitus.  There is no redness or signs of recent trauma.  Right knee ROM is 0-90 and left knee ROM is 0-95.  Impression:  Chronic pain of the both knees  Return:  1 month  PROCEDURE NOTE:  The patient requests injections of both knees, verbal consent was obtained.  The left and right knee were individually prepped appropriately after time out was performed.   Sterile technique was observed and injection of 1 cc of Depo-Medrol 40 mg with several cc's of plain xylocaine. Anesthesia was provided by ethyl chloride and a 20-gauge needle was used to inject each knee area. The injections were tolerated well.  A band aid dressing was applied.  The patient was advised to apply ice later today and tomorrow to the injection sight as needed.   I have reviewed the [place] web site prior to prescribing narcotic medicine for this patient.  Electronically Signed Sanjuana Kava, MD 1/2/20198:44 AM

## 2017-08-24 ENCOUNTER — Encounter (HOSPITAL_BASED_OUTPATIENT_CLINIC_OR_DEPARTMENT_OTHER): Payer: Medicare HMO | Attending: Internal Medicine

## 2017-08-24 DIAGNOSIS — I110 Hypertensive heart disease with heart failure: Secondary | ICD-10-CM | POA: Insufficient documentation

## 2017-08-24 DIAGNOSIS — I890 Lymphedema, not elsewhere classified: Secondary | ICD-10-CM | POA: Diagnosis not present

## 2017-08-24 DIAGNOSIS — I87331 Chronic venous hypertension (idiopathic) with ulcer and inflammation of right lower extremity: Secondary | ICD-10-CM | POA: Insufficient documentation

## 2017-08-24 DIAGNOSIS — E1151 Type 2 diabetes mellitus with diabetic peripheral angiopathy without gangrene: Secondary | ICD-10-CM | POA: Diagnosis not present

## 2017-08-24 DIAGNOSIS — G4730 Sleep apnea, unspecified: Secondary | ICD-10-CM | POA: Diagnosis not present

## 2017-08-24 DIAGNOSIS — Z6841 Body Mass Index (BMI) 40.0 and over, adult: Secondary | ICD-10-CM | POA: Diagnosis not present

## 2017-08-24 DIAGNOSIS — I509 Heart failure, unspecified: Secondary | ICD-10-CM | POA: Diagnosis not present

## 2017-08-24 DIAGNOSIS — L97812 Non-pressure chronic ulcer of other part of right lower leg with fat layer exposed: Secondary | ICD-10-CM | POA: Insufficient documentation

## 2017-08-24 DIAGNOSIS — L89892 Pressure ulcer of other site, stage 2: Secondary | ICD-10-CM | POA: Diagnosis not present

## 2017-08-24 DIAGNOSIS — S31103A Unspecified open wound of abdominal wall, right lower quadrant without penetration into peritoneal cavity, initial encounter: Secondary | ICD-10-CM | POA: Diagnosis not present

## 2017-08-24 DIAGNOSIS — X58XXXA Exposure to other specified factors, initial encounter: Secondary | ICD-10-CM | POA: Insufficient documentation

## 2017-08-24 DIAGNOSIS — E11622 Type 2 diabetes mellitus with other skin ulcer: Secondary | ICD-10-CM | POA: Diagnosis not present

## 2017-08-24 DIAGNOSIS — E1140 Type 2 diabetes mellitus with diabetic neuropathy, unspecified: Secondary | ICD-10-CM | POA: Diagnosis not present

## 2017-09-07 DIAGNOSIS — I87331 Chronic venous hypertension (idiopathic) with ulcer and inflammation of right lower extremity: Secondary | ICD-10-CM | POA: Diagnosis not present

## 2017-09-21 ENCOUNTER — Encounter (HOSPITAL_BASED_OUTPATIENT_CLINIC_OR_DEPARTMENT_OTHER): Payer: Medicare HMO | Attending: Internal Medicine

## 2017-09-21 DIAGNOSIS — I87331 Chronic venous hypertension (idiopathic) with ulcer and inflammation of right lower extremity: Secondary | ICD-10-CM | POA: Insufficient documentation

## 2017-09-21 DIAGNOSIS — L89892 Pressure ulcer of other site, stage 2: Secondary | ICD-10-CM | POA: Insufficient documentation

## 2017-09-21 DIAGNOSIS — E1140 Type 2 diabetes mellitus with diabetic neuropathy, unspecified: Secondary | ICD-10-CM | POA: Insufficient documentation

## 2017-09-21 DIAGNOSIS — X58XXXA Exposure to other specified factors, initial encounter: Secondary | ICD-10-CM | POA: Insufficient documentation

## 2017-09-21 DIAGNOSIS — I509 Heart failure, unspecified: Secondary | ICD-10-CM | POA: Insufficient documentation

## 2017-09-21 DIAGNOSIS — I110 Hypertensive heart disease with heart failure: Secondary | ICD-10-CM | POA: Insufficient documentation

## 2017-09-21 DIAGNOSIS — E1151 Type 2 diabetes mellitus with diabetic peripheral angiopathy without gangrene: Secondary | ICD-10-CM | POA: Insufficient documentation

## 2017-09-21 DIAGNOSIS — S31103A Unspecified open wound of abdominal wall, right lower quadrant without penetration into peritoneal cavity, initial encounter: Secondary | ICD-10-CM | POA: Insufficient documentation

## 2017-09-21 DIAGNOSIS — Z6841 Body Mass Index (BMI) 40.0 and over, adult: Secondary | ICD-10-CM | POA: Insufficient documentation

## 2017-09-21 DIAGNOSIS — E11622 Type 2 diabetes mellitus with other skin ulcer: Secondary | ICD-10-CM | POA: Insufficient documentation

## 2017-09-21 DIAGNOSIS — L97812 Non-pressure chronic ulcer of other part of right lower leg with fat layer exposed: Secondary | ICD-10-CM | POA: Insufficient documentation

## 2017-09-25 ENCOUNTER — Ambulatory Visit: Payer: Medicare HMO | Admitting: Orthopaedic Surgery

## 2017-09-27 ENCOUNTER — Ambulatory Visit: Payer: Medicare HMO | Admitting: Orthopaedic Surgery

## 2017-09-27 ENCOUNTER — Encounter: Payer: Self-pay | Admitting: Orthopaedic Surgery

## 2017-09-27 DIAGNOSIS — M25562 Pain in left knee: Secondary | ICD-10-CM | POA: Diagnosis not present

## 2017-09-27 DIAGNOSIS — G8929 Other chronic pain: Secondary | ICD-10-CM

## 2017-09-27 DIAGNOSIS — M25561 Pain in right knee: Secondary | ICD-10-CM

## 2017-09-27 MED ORDER — OXYCODONE HCL 15 MG PO TABS: ORAL_TABLET | ORAL | 0 refills | Status: DC

## 2017-09-27 NOTE — Progress Notes (Signed)
CC: Both of my knees are hurting. I would like an injection in both knees.  The patient has had chronic pain and tenderness of both knees for some time.  Injections help.  There is no locking or giving way of the knee.  There is no new trauma. There is no redness or signs of infections.  The knees have a mild effusion and some crepitus.  There is no redness or signs of recent trauma.  Right knee ROM is 0-90 and left knee ROM is 0-95.  Impression:  Chronic pain of the both knees  Return:  1 month  PROCEDURE NOTE:  The patient requests injections of both knees, verbal consent was obtained.  The left and right knee were individually prepped appropriately after time out was performed.   Sterile technique was observed and injection of 1 cc of Depo-Medrol 40 mg with several cc's of plain xylocaine. Anesthesia was provided by ethyl chloride and a 20-gauge needle was used to inject each knee area. The injections were tolerated well.  A band aid dressing was applied.  The patient was advised to apply ice later today and tomorrow to the injection sight as needed.  I have reviewed the [place] web site prior to prescribing narcotic medicine for this patient. Electronically Signed Sanjuana Kava, MD 2/7/20198:29 AM

## 2017-09-28 ENCOUNTER — Other Ambulatory Visit: Payer: Self-pay | Admitting: Internal Medicine

## 2017-09-28 ENCOUNTER — Other Ambulatory Visit (HOSPITAL_COMMUNITY)
Admission: RE | Admit: 2017-09-28 | Discharge: 2017-09-28 | Disposition: A | Discharge status: Home / Self Care | Payer: Medicare HMO | Source: Other Acute Inpatient Hospital | Attending: Internal Medicine | Admitting: Internal Medicine

## 2017-09-28 DIAGNOSIS — I87331 Chronic venous hypertension (idiopathic) with ulcer and inflammation of right lower extremity: Secondary | ICD-10-CM | POA: Insufficient documentation

## 2017-09-28 DIAGNOSIS — L97213 Non-pressure chronic ulcer of right calf with necrosis of muscle: Secondary | ICD-10-CM | POA: Diagnosis not present

## 2017-09-28 DIAGNOSIS — E08622 Diabetes mellitus due to underlying condition with other skin ulcer: Secondary | ICD-10-CM | POA: Insufficient documentation

## 2017-10-02 LAB — AEROBIC CULTURE  (SUPERFICIAL SPECIMEN)

## 2017-10-02 LAB — AEROBIC CULTURE W GRAM STAIN (SUPERFICIAL SPECIMEN)

## 2017-10-12 DIAGNOSIS — L89892 Pressure ulcer of other site, stage 2: Secondary | ICD-10-CM | POA: Diagnosis not present

## 2017-10-12 DIAGNOSIS — I509 Heart failure, unspecified: Secondary | ICD-10-CM | POA: Diagnosis not present

## 2017-10-12 DIAGNOSIS — X58XXXA Exposure to other specified factors, initial encounter: Secondary | ICD-10-CM | POA: Diagnosis not present

## 2017-10-12 DIAGNOSIS — E1151 Type 2 diabetes mellitus with diabetic peripheral angiopathy without gangrene: Secondary | ICD-10-CM | POA: Diagnosis not present

## 2017-10-12 DIAGNOSIS — L97812 Non-pressure chronic ulcer of other part of right lower leg with fat layer exposed: Secondary | ICD-10-CM | POA: Diagnosis not present

## 2017-10-12 DIAGNOSIS — E11622 Type 2 diabetes mellitus with other skin ulcer: Secondary | ICD-10-CM | POA: Diagnosis not present

## 2017-10-12 DIAGNOSIS — I110 Hypertensive heart disease with heart failure: Secondary | ICD-10-CM | POA: Diagnosis not present

## 2017-10-12 DIAGNOSIS — I87331 Chronic venous hypertension (idiopathic) with ulcer and inflammation of right lower extremity: Secondary | ICD-10-CM | POA: Diagnosis present

## 2017-10-12 DIAGNOSIS — S31103A Unspecified open wound of abdominal wall, right lower quadrant without penetration into peritoneal cavity, initial encounter: Secondary | ICD-10-CM | POA: Diagnosis not present

## 2017-10-12 DIAGNOSIS — E1140 Type 2 diabetes mellitus with diabetic neuropathy, unspecified: Secondary | ICD-10-CM | POA: Diagnosis not present

## 2017-10-12 DIAGNOSIS — Z6841 Body Mass Index (BMI) 40.0 and over, adult: Secondary | ICD-10-CM | POA: Diagnosis not present

## 2017-10-25 ENCOUNTER — Encounter (HOSPITAL_BASED_OUTPATIENT_CLINIC_OR_DEPARTMENT_OTHER): Payer: Medicare HMO | Attending: Internal Medicine

## 2017-10-25 DIAGNOSIS — I110 Hypertensive heart disease with heart failure: Secondary | ICD-10-CM | POA: Insufficient documentation

## 2017-10-25 DIAGNOSIS — L97812 Non-pressure chronic ulcer of other part of right lower leg with fat layer exposed: Secondary | ICD-10-CM | POA: Insufficient documentation

## 2017-10-25 DIAGNOSIS — E11622 Type 2 diabetes mellitus with other skin ulcer: Secondary | ICD-10-CM | POA: Diagnosis not present

## 2017-10-25 DIAGNOSIS — X58XXXA Exposure to other specified factors, initial encounter: Secondary | ICD-10-CM | POA: Insufficient documentation

## 2017-10-25 DIAGNOSIS — I890 Lymphedema, not elsewhere classified: Secondary | ICD-10-CM | POA: Insufficient documentation

## 2017-10-25 DIAGNOSIS — E1140 Type 2 diabetes mellitus with diabetic neuropathy, unspecified: Secondary | ICD-10-CM | POA: Diagnosis not present

## 2017-10-25 DIAGNOSIS — I509 Heart failure, unspecified: Secondary | ICD-10-CM | POA: Insufficient documentation

## 2017-10-25 DIAGNOSIS — L97219 Non-pressure chronic ulcer of right calf with unspecified severity: Secondary | ICD-10-CM | POA: Diagnosis not present

## 2017-10-25 DIAGNOSIS — G4730 Sleep apnea, unspecified: Secondary | ICD-10-CM | POA: Insufficient documentation

## 2017-10-25 DIAGNOSIS — Z6841 Body Mass Index (BMI) 40.0 and over, adult: Secondary | ICD-10-CM | POA: Diagnosis not present

## 2017-10-25 DIAGNOSIS — I87331 Chronic venous hypertension (idiopathic) with ulcer and inflammation of right lower extremity: Secondary | ICD-10-CM | POA: Insufficient documentation

## 2017-10-25 DIAGNOSIS — S31103A Unspecified open wound of abdominal wall, right lower quadrant without penetration into peritoneal cavity, initial encounter: Secondary | ICD-10-CM | POA: Insufficient documentation

## 2017-10-26 ENCOUNTER — Ambulatory Visit: Payer: Medicare HMO | Admitting: Orthopedic Surgery

## 2017-10-26 ENCOUNTER — Encounter: Payer: Self-pay | Admitting: Orthopedic Surgery

## 2017-10-26 VITALS — Resp 20

## 2017-10-26 DIAGNOSIS — M25561 Pain in right knee: Secondary | ICD-10-CM | POA: Diagnosis not present

## 2017-10-26 DIAGNOSIS — M25562 Pain in left knee: Secondary | ICD-10-CM | POA: Diagnosis not present

## 2017-10-26 DIAGNOSIS — G8929 Other chronic pain: Secondary | ICD-10-CM | POA: Diagnosis not present

## 2017-10-26 MED ORDER — OXYCODONE HCL 15 MG PO TABS: ORAL_TABLET | ORAL | 0 refills | Status: DC

## 2017-10-26 NOTE — Progress Notes (Signed)
Chief Complaint  Patient presents with  . Injections    bilateral knee injections/ for Dr Keeling/ patient is in car      Procedure note left knee injection verbal consent was obtained to inject left knee joint  Timeout was completed to confirm the site of injection  The medications used were 40 mg of Depo-Medrol and 1% lidocaine 3 cc  Anesthesia was provided by ethyl chloride and the skin was prepped with alcohol.  After cleaning the skin with alcohol a 20-gauge needle was used to inject the left knee joint. There were no complications. A sterile bandage was applied.   Procedure note right knee injection verbal consent was obtained to inject right knee joint  Timeout was completed to confirm the site of injection  The medications used were 40 mg of Depo-Medrol and 1% lidocaine 3 cc  Anesthesia was provided by ethyl chloride and the skin was prepped with alcohol.  After cleaning the skin with alcohol a 20-gauge needle was used to inject the right knee joint. There were no complications. A sterile bandage was applied.  Encounter Diagnoses  Name Primary?  . Chronic pain of right knee Yes  . Chronic pain of left knee   . Morbid obesity due to excess calories ([place])

## 2017-11-09 ENCOUNTER — Telehealth: Payer: Self-pay | Admitting: Radiology

## 2017-11-09 NOTE — Telephone Encounter (Signed)
[facility] to advise.

## 2017-11-09 NOTE — Telephone Encounter (Signed)
Yes  Its not a change in treatment   This is what dr Raliegh Ip has him on

## 2017-11-09 NOTE — Telephone Encounter (Signed)
[facility] is [facility] they have gotten a warning about Oxycodone with the Valium   And his dose of Oxycodone exceeds safe morphine equivalency they are asking if okay to fill for him

## 2017-11-22 ENCOUNTER — Encounter (HOSPITAL_BASED_OUTPATIENT_CLINIC_OR_DEPARTMENT_OTHER): Payer: Medicare HMO | Attending: Internal Medicine

## 2017-11-22 DIAGNOSIS — E1151 Type 2 diabetes mellitus with diabetic peripheral angiopathy without gangrene: Secondary | ICD-10-CM | POA: Diagnosis not present

## 2017-11-22 DIAGNOSIS — I890 Lymphedema, not elsewhere classified: Secondary | ICD-10-CM | POA: Diagnosis not present

## 2017-11-22 DIAGNOSIS — Z6841 Body Mass Index (BMI) 40.0 and over, adult: Secondary | ICD-10-CM | POA: Insufficient documentation

## 2017-11-22 DIAGNOSIS — E11622 Type 2 diabetes mellitus with other skin ulcer: Secondary | ICD-10-CM | POA: Insufficient documentation

## 2017-11-22 DIAGNOSIS — L97319 Non-pressure chronic ulcer of right ankle with unspecified severity: Secondary | ICD-10-CM | POA: Insufficient documentation

## 2017-11-22 DIAGNOSIS — X58XXXA Exposure to other specified factors, initial encounter: Secondary | ICD-10-CM | POA: Diagnosis not present

## 2017-11-22 DIAGNOSIS — I87331 Chronic venous hypertension (idiopathic) with ulcer and inflammation of right lower extremity: Secondary | ICD-10-CM | POA: Diagnosis not present

## 2017-11-22 DIAGNOSIS — L89892 Pressure ulcer of other site, stage 2: Secondary | ICD-10-CM | POA: Diagnosis not present

## 2017-11-22 DIAGNOSIS — L97812 Non-pressure chronic ulcer of other part of right lower leg with fat layer exposed: Secondary | ICD-10-CM | POA: Insufficient documentation

## 2017-11-22 DIAGNOSIS — I110 Hypertensive heart disease with heart failure: Secondary | ICD-10-CM | POA: Diagnosis not present

## 2017-11-22 DIAGNOSIS — G4730 Sleep apnea, unspecified: Secondary | ICD-10-CM | POA: Insufficient documentation

## 2017-11-22 DIAGNOSIS — S31103A Unspecified open wound of abdominal wall, right lower quadrant without penetration into peritoneal cavity, initial encounter: Secondary | ICD-10-CM | POA: Insufficient documentation

## 2017-11-22 DIAGNOSIS — E1140 Type 2 diabetes mellitus with diabetic neuropathy, unspecified: Secondary | ICD-10-CM | POA: Insufficient documentation

## 2017-11-22 DIAGNOSIS — I509 Heart failure, unspecified: Secondary | ICD-10-CM | POA: Insufficient documentation

## 2017-11-26 ENCOUNTER — Ambulatory Visit: Payer: Medicare HMO | Admitting: Orthopedic Surgery

## 2017-11-26 ENCOUNTER — Encounter: Payer: Self-pay | Admitting: Orthopedic Surgery

## 2017-11-26 DIAGNOSIS — M25561 Pain in right knee: Secondary | ICD-10-CM | POA: Diagnosis not present

## 2017-11-26 DIAGNOSIS — M25562 Pain in left knee: Secondary | ICD-10-CM

## 2017-11-26 DIAGNOSIS — G8929 Other chronic pain: Secondary | ICD-10-CM

## 2017-11-26 MED ORDER — OXYCODONE HCL 15 MG PO TABS: ORAL_TABLET | ORAL | 0 refills | Status: DC

## 2017-11-26 NOTE — Progress Notes (Signed)
Follow-up  Chronic pain both knees  Patient comes in for requested bilateral knee injections and refill on his pain medication  Encounter Diagnoses  Name Primary?  . Chronic pain of right knee Yes  . Chronic pain of left knee   . Morbid obesity due to excess calories ([place])      Procedure note left knee injection verbal consent was obtained to inject left knee joint  Timeout was completed to confirm the site of injection  The medications used were 40 mg of Depo-Medrol and 1% lidocaine 3 cc  Anesthesia was provided by ethyl chloride and the skin was prepped with alcohol.  After cleaning the skin with alcohol a 20-gauge needle was used to inject the left knee joint. There were no complications. A sterile bandage was applied.   Procedure note right knee injection verbal consent was obtained to inject right knee joint  Timeout was completed to confirm the site of injection  The medications used were 40 mg of Depo-Medrol and 1% lidocaine 3 cc  Anesthesia was provided by ethyl chloride and the skin was prepped with alcohol.  After cleaning the skin with alcohol a 20-gauge needle was used to inject the right knee joint. There were no complications. A sterile bandage was applied.  Follow-up in a month medication refill

## 2017-12-04 DIAGNOSIS — Z79899 Other long term (current) drug therapy: Secondary | ICD-10-CM | POA: Insufficient documentation

## 2017-12-04 DIAGNOSIS — Z79890 Hormone replacement therapy (postmenopausal): Secondary | ICD-10-CM | POA: Insufficient documentation

## 2017-12-20 ENCOUNTER — Encounter (HOSPITAL_BASED_OUTPATIENT_CLINIC_OR_DEPARTMENT_OTHER): Payer: Medicare HMO | Attending: Internal Medicine

## 2017-12-20 DIAGNOSIS — L97312 Non-pressure chronic ulcer of right ankle with fat layer exposed: Secondary | ICD-10-CM | POA: Diagnosis not present

## 2017-12-20 DIAGNOSIS — G4730 Sleep apnea, unspecified: Secondary | ICD-10-CM | POA: Diagnosis not present

## 2017-12-20 DIAGNOSIS — I110 Hypertensive heart disease with heart failure: Secondary | ICD-10-CM | POA: Diagnosis not present

## 2017-12-20 DIAGNOSIS — I509 Heart failure, unspecified: Secondary | ICD-10-CM | POA: Diagnosis not present

## 2017-12-20 DIAGNOSIS — X58XXXA Exposure to other specified factors, initial encounter: Secondary | ICD-10-CM | POA: Insufficient documentation

## 2017-12-20 DIAGNOSIS — E11622 Type 2 diabetes mellitus with other skin ulcer: Secondary | ICD-10-CM | POA: Insufficient documentation

## 2017-12-20 DIAGNOSIS — S31103A Unspecified open wound of abdominal wall, right lower quadrant without penetration into peritoneal cavity, initial encounter: Secondary | ICD-10-CM | POA: Diagnosis not present

## 2017-12-20 DIAGNOSIS — L97212 Non-pressure chronic ulcer of right calf with fat layer exposed: Secondary | ICD-10-CM | POA: Insufficient documentation

## 2017-12-20 DIAGNOSIS — I87331 Chronic venous hypertension (idiopathic) with ulcer and inflammation of right lower extremity: Secondary | ICD-10-CM | POA: Diagnosis not present

## 2017-12-20 DIAGNOSIS — L97812 Non-pressure chronic ulcer of other part of right lower leg with fat layer exposed: Secondary | ICD-10-CM | POA: Insufficient documentation

## 2017-12-20 DIAGNOSIS — I890 Lymphedema, not elsewhere classified: Secondary | ICD-10-CM | POA: Diagnosis not present

## 2017-12-20 DIAGNOSIS — Z6841 Body Mass Index (BMI) 40.0 and over, adult: Secondary | ICD-10-CM | POA: Insufficient documentation

## 2017-12-20 DIAGNOSIS — E1140 Type 2 diabetes mellitus with diabetic neuropathy, unspecified: Secondary | ICD-10-CM | POA: Diagnosis not present

## 2018-01-02 ENCOUNTER — Ambulatory Visit: Payer: Medicare HMO | Admitting: Orthopaedic Surgery

## 2018-01-02 ENCOUNTER — Encounter: Payer: Self-pay | Admitting: Orthopaedic Surgery

## 2018-01-02 DIAGNOSIS — M25562 Pain in left knee: Secondary | ICD-10-CM

## 2018-01-02 DIAGNOSIS — M25561 Pain in right knee: Secondary | ICD-10-CM

## 2018-01-02 DIAGNOSIS — G8929 Other chronic pain: Secondary | ICD-10-CM

## 2018-01-02 MED ORDER — OXYCODONE HCL 15 MG PO TABS: ORAL_TABLET | ORAL | 0 refills | Status: DC

## 2018-01-02 NOTE — Progress Notes (Signed)
CC: Both of my knees are hurting. I would like an injection in both knees.  The patient has had chronic pain and tenderness of both knees for some time.  Injections help.  There is no locking or giving way of the knee.  There is no new trauma. There is no redness or signs of infections.  The knees have a mild effusion and some crepitus.  There is no redness or signs of recent trauma.  Right knee ROM is 0-95 and left knee ROM is 0-100.  Impression:  Chronic pain of the both knees  Return:  1 month  PROCEDURE NOTE:  The patient requests injections of both knees, verbal consent was obtained.  The left and right knee were individually prepped appropriately after time out was performed.   Sterile technique was observed and injection of 1 cc of Depo-Medrol 40 mg with several cc's of plain xylocaine. Anesthesia was provided by ethyl chloride and a 20-gauge needle was used to inject each knee area. The injections were tolerated well.  A band aid dressing was applied.  The patient was advised to apply ice later today and tomorrow to the injection sight as needed.  I have reviewed the [place] web site prior to prescribing narcotic medicine for this patient.  Electronically Signed Sanjuana Kava, MD 5/15/201911:37 AM

## 2018-01-10 DIAGNOSIS — I87331 Chronic venous hypertension (idiopathic) with ulcer and inflammation of right lower extremity: Secondary | ICD-10-CM | POA: Diagnosis not present

## 2018-01-31 ENCOUNTER — Ambulatory Visit: Payer: Medicare HMO | Admitting: Orthopaedic Surgery

## 2018-01-31 ENCOUNTER — Encounter: Payer: Self-pay | Admitting: Orthopaedic Surgery

## 2018-01-31 DIAGNOSIS — M25562 Pain in left knee: Secondary | ICD-10-CM

## 2018-01-31 DIAGNOSIS — M25561 Pain in right knee: Secondary | ICD-10-CM

## 2018-01-31 DIAGNOSIS — G8929 Other chronic pain: Secondary | ICD-10-CM

## 2018-01-31 MED ORDER — OXYCODONE HCL 15 MG PO TABS: ORAL_TABLET | ORAL | 0 refills | Status: DC

## 2018-01-31 NOTE — Progress Notes (Signed)
CC: Both of my knees are hurting. I would like an injection in both knees.  The patient has had chronic pain and tenderness of both knees for some time.  Injections help.  There is no locking or giving way of the knee.  There is no new trauma. There is no redness or signs of infections.  The knees have a mild effusion and some crepitus.  There is no redness or signs of recent trauma.  Right knee ROM is 0-95 and left knee ROM is 0-90.  Impression:  Chronic pain of the both knees  Return:  1 month  PROCEDURE NOTE:  The patient requests injections of both knees, verbal consent was obtained.  The left and right knee were individually prepped appropriately after time out was performed.   Sterile technique was observed and injection of 1 cc of Depo-Medrol 40 mg with several cc's of plain xylocaine. Anesthesia was provided by ethyl chloride and a 20-gauge needle was used to inject each knee area. The injections were tolerated well.  A band aid dressing was applied.  The patient was advised to apply ice later today and tomorrow to the injection sight as needed.  I had him sign a narcotic pain agreement today.  I have reviewed the [place] web site prior to prescribing narcotic medicine for this patient.  Electronically Signed Sanjuana Kava, MD 6/13/20199:23 AM

## 2018-02-07 ENCOUNTER — Encounter (HOSPITAL_BASED_OUTPATIENT_CLINIC_OR_DEPARTMENT_OTHER): Payer: Medicare HMO | Attending: Internal Medicine

## 2018-02-07 DIAGNOSIS — X58XXXA Exposure to other specified factors, initial encounter: Secondary | ICD-10-CM | POA: Diagnosis not present

## 2018-02-07 DIAGNOSIS — S31103A Unspecified open wound of abdominal wall, right lower quadrant without penetration into peritoneal cavity, initial encounter: Secondary | ICD-10-CM | POA: Insufficient documentation

## 2018-02-07 DIAGNOSIS — L97312 Non-pressure chronic ulcer of right ankle with fat layer exposed: Secondary | ICD-10-CM | POA: Insufficient documentation

## 2018-02-07 DIAGNOSIS — L97812 Non-pressure chronic ulcer of other part of right lower leg with fat layer exposed: Secondary | ICD-10-CM | POA: Insufficient documentation

## 2018-02-07 DIAGNOSIS — L97819 Non-pressure chronic ulcer of other part of right lower leg with unspecified severity: Secondary | ICD-10-CM | POA: Insufficient documentation

## 2018-02-07 DIAGNOSIS — I890 Lymphedema, not elsewhere classified: Secondary | ICD-10-CM | POA: Diagnosis not present

## 2018-02-07 DIAGNOSIS — E11622 Type 2 diabetes mellitus with other skin ulcer: Secondary | ICD-10-CM | POA: Insufficient documentation

## 2018-02-07 DIAGNOSIS — L97212 Non-pressure chronic ulcer of right calf with fat layer exposed: Secondary | ICD-10-CM | POA: Diagnosis not present

## 2018-02-07 DIAGNOSIS — G4730 Sleep apnea, unspecified: Secondary | ICD-10-CM | POA: Diagnosis not present

## 2018-02-07 DIAGNOSIS — I87331 Chronic venous hypertension (idiopathic) with ulcer and inflammation of right lower extremity: Secondary | ICD-10-CM | POA: Diagnosis present

## 2018-02-07 DIAGNOSIS — I509 Heart failure, unspecified: Secondary | ICD-10-CM | POA: Insufficient documentation

## 2018-02-07 DIAGNOSIS — E1151 Type 2 diabetes mellitus with diabetic peripheral angiopathy without gangrene: Secondary | ICD-10-CM | POA: Insufficient documentation

## 2018-02-07 DIAGNOSIS — E1140 Type 2 diabetes mellitus with diabetic neuropathy, unspecified: Secondary | ICD-10-CM | POA: Insufficient documentation

## 2018-02-07 DIAGNOSIS — Z6841 Body Mass Index (BMI) 40.0 and over, adult: Secondary | ICD-10-CM | POA: Diagnosis not present

## 2018-02-07 DIAGNOSIS — L97119 Non-pressure chronic ulcer of right thigh with unspecified severity: Secondary | ICD-10-CM | POA: Insufficient documentation

## 2018-02-07 DIAGNOSIS — I110 Hypertensive heart disease with heart failure: Secondary | ICD-10-CM | POA: Insufficient documentation

## 2018-02-28 ENCOUNTER — Ambulatory Visit: Payer: Medicare HMO | Admitting: Orthopaedic Surgery

## 2018-02-28 ENCOUNTER — Encounter: Payer: Self-pay | Admitting: Orthopaedic Surgery

## 2018-02-28 DIAGNOSIS — G8929 Other chronic pain: Secondary | ICD-10-CM | POA: Diagnosis not present

## 2018-02-28 DIAGNOSIS — M25561 Pain in right knee: Secondary | ICD-10-CM | POA: Diagnosis not present

## 2018-02-28 DIAGNOSIS — M25562 Pain in left knee: Secondary | ICD-10-CM | POA: Diagnosis not present

## 2018-02-28 DIAGNOSIS — Z6841 Body Mass Index (BMI) 40.0 and over, adult: Secondary | ICD-10-CM

## 2018-02-28 MED ORDER — OXYCODONE HCL 15 MG PO TABS: ORAL_TABLET | ORAL | 0 refills | Status: DC

## 2018-02-28 NOTE — Progress Notes (Signed)
CC: Both of my knees are hurting. I would like an injection in both knees.  The patient has had chronic pain and tenderness of both knees for some time.  Injections help.  There is no locking or giving way of the knee.  There is no new trauma. There is no redness or signs of infections.  The knees have a mild effusion and some crepitus.  There is no redness or signs of recent trauma.  Right knee ROM is 0-90 and left knee ROM is 0-95.  Impression:  Chronic pain of the both knees  Return:  1 month  PROCEDURE NOTE:  The patient requests injections of both knees, verbal consent was obtained.  The left and right knee were individually prepped appropriately after time out was performed.   Sterile technique was observed and injection of 1 cc of Depo-Medrol 40 mg with several cc's of plain xylocaine. Anesthesia was provided by ethyl chloride and a 20-gauge needle was used to inject each knee area. The injections were tolerated well.  A band aid dressing was applied.  The patient was advised to apply ice later today and tomorrow to the injection sight as needed.  I have reviewed the [place] web site prior to prescribing narcotic medicine for this patient.  Electronically Signed Sanjuana Kava, MD 7/11/201910:19 AM

## 2018-03-07 ENCOUNTER — Encounter (HOSPITAL_BASED_OUTPATIENT_CLINIC_OR_DEPARTMENT_OTHER): Payer: Self-pay

## 2018-03-07 ENCOUNTER — Encounter (HOSPITAL_BASED_OUTPATIENT_CLINIC_OR_DEPARTMENT_OTHER): Payer: Medicare HMO | Attending: Internal Medicine

## 2018-03-07 DIAGNOSIS — L97312 Non-pressure chronic ulcer of right ankle with fat layer exposed: Secondary | ICD-10-CM | POA: Insufficient documentation

## 2018-03-07 DIAGNOSIS — X58XXXA Exposure to other specified factors, initial encounter: Secondary | ICD-10-CM | POA: Diagnosis not present

## 2018-03-07 DIAGNOSIS — E1140 Type 2 diabetes mellitus with diabetic neuropathy, unspecified: Secondary | ICD-10-CM | POA: Insufficient documentation

## 2018-03-07 DIAGNOSIS — I509 Heart failure, unspecified: Secondary | ICD-10-CM | POA: Insufficient documentation

## 2018-03-07 DIAGNOSIS — I890 Lymphedema, not elsewhere classified: Secondary | ICD-10-CM | POA: Insufficient documentation

## 2018-03-07 DIAGNOSIS — G4730 Sleep apnea, unspecified: Secondary | ICD-10-CM | POA: Diagnosis not present

## 2018-03-07 DIAGNOSIS — I87331 Chronic venous hypertension (idiopathic) with ulcer and inflammation of right lower extremity: Secondary | ICD-10-CM | POA: Insufficient documentation

## 2018-03-07 DIAGNOSIS — L97212 Non-pressure chronic ulcer of right calf with fat layer exposed: Secondary | ICD-10-CM | POA: Diagnosis not present

## 2018-03-07 DIAGNOSIS — Z6841 Body Mass Index (BMI) 40.0 and over, adult: Secondary | ICD-10-CM | POA: Insufficient documentation

## 2018-03-07 DIAGNOSIS — I110 Hypertensive heart disease with heart failure: Secondary | ICD-10-CM | POA: Insufficient documentation

## 2018-03-07 DIAGNOSIS — L304 Erythema intertrigo: Secondary | ICD-10-CM | POA: Diagnosis not present

## 2018-03-07 DIAGNOSIS — S31103A Unspecified open wound of abdominal wall, right lower quadrant without penetration into peritoneal cavity, initial encounter: Secondary | ICD-10-CM | POA: Diagnosis not present

## 2018-03-07 DIAGNOSIS — L97812 Non-pressure chronic ulcer of other part of right lower leg with fat layer exposed: Secondary | ICD-10-CM | POA: Diagnosis present

## 2018-04-02 ENCOUNTER — Ambulatory Visit: Payer: Medicare HMO | Admitting: Orthopaedic Surgery

## 2018-04-02 ENCOUNTER — Encounter: Payer: Self-pay | Admitting: Orthopaedic Surgery

## 2018-04-02 DIAGNOSIS — G8929 Other chronic pain: Secondary | ICD-10-CM

## 2018-04-02 DIAGNOSIS — Z6841 Body Mass Index (BMI) 40.0 and over, adult: Secondary | ICD-10-CM

## 2018-04-02 DIAGNOSIS — M25562 Pain in left knee: Secondary | ICD-10-CM

## 2018-04-02 DIAGNOSIS — M25561 Pain in right knee: Secondary | ICD-10-CM

## 2018-04-02 MED ORDER — OXYCODONE HCL 15 MG PO TABS: ORAL_TABLET | ORAL | 0 refills | Status: DC

## 2018-04-02 NOTE — Progress Notes (Signed)
CC: Both of my knees are hurting. I would like an injection in both knees.  The patient has had chronic pain and tenderness of both knees for some time.  Injections help.  There is no locking or giving way of the knee.  There is no new trauma. There is no redness or signs of infections.  The knees have a mild effusion and some crepitus.  There is no redness or signs of recent trauma.  Right knee ROM is 0-95 and left knee ROM is 0-90.  Impression:  Chronic pain of the both knees  Return:  1 month  PROCEDURE NOTE:  The patient requests injections of both knees, verbal consent was obtained.  The left and right knee were individually prepped appropriately after time out was performed.   Sterile technique was observed and injection of 1 cc of Depo-Medrol 40 mg with several cc's of plain xylocaine. Anesthesia was provided by ethyl chloride and a 20-gauge needle was used to inject each knee area. The injections were tolerated well.  A band aid dressing was applied.  The patient was advised to apply ice later today and tomorrow to the injection sight as needed.  I have reviewed the [place] web site prior to prescribing narcotic medicine for this patient. The patient has read and signed an Opioid Treatment Agreement which has been scanned and added to the medical record.  The patient understands the agreement and agrees to abide with it.  The patient has chronic pain that is being treated with an opioid which relieves the pain.  The patient understands potential complications with chronic opioid treatment.  Electronically Signed Sanjuana Kava, MD 8/13/201910:49 AM

## 2018-04-05 ENCOUNTER — Telehealth: Payer: Self-pay | Admitting: Orthopaedic Surgery

## 2018-04-05 NOTE — Telephone Encounter (Signed)
Per our conversation, you discussed with the patient on Thursday about sending in a prescription for Voltaren Gel but this was not done.  Would [name] [place]?  Thanks so much

## 2018-04-09 MED ORDER — DICLOFENAC SODIUM 1 % TD GEL: 4.0000 g | Freq: Four times a day (QID) | TRANSDERMAL | 5 refills | Status: DC

## 2018-04-18 ENCOUNTER — Encounter (HOSPITAL_BASED_OUTPATIENT_CLINIC_OR_DEPARTMENT_OTHER): Payer: Medicare HMO | Attending: Internal Medicine

## 2018-04-18 DIAGNOSIS — I87331 Chronic venous hypertension (idiopathic) with ulcer and inflammation of right lower extremity: Secondary | ICD-10-CM | POA: Insufficient documentation

## 2018-04-18 DIAGNOSIS — E11622 Type 2 diabetes mellitus with other skin ulcer: Secondary | ICD-10-CM | POA: Insufficient documentation

## 2018-04-18 DIAGNOSIS — L97112 Non-pressure chronic ulcer of right thigh with fat layer exposed: Secondary | ICD-10-CM | POA: Insufficient documentation

## 2018-04-18 DIAGNOSIS — E1140 Type 2 diabetes mellitus with diabetic neuropathy, unspecified: Secondary | ICD-10-CM | POA: Insufficient documentation

## 2018-04-18 DIAGNOSIS — Z6841 Body Mass Index (BMI) 40.0 and over, adult: Secondary | ICD-10-CM | POA: Diagnosis not present

## 2018-04-18 DIAGNOSIS — L97812 Non-pressure chronic ulcer of other part of right lower leg with fat layer exposed: Secondary | ICD-10-CM | POA: Insufficient documentation

## 2018-04-18 DIAGNOSIS — G4730 Sleep apnea, unspecified: Secondary | ICD-10-CM | POA: Diagnosis not present

## 2018-04-18 DIAGNOSIS — S31103A Unspecified open wound of abdominal wall, right lower quadrant without penetration into peritoneal cavity, initial encounter: Secondary | ICD-10-CM | POA: Diagnosis not present

## 2018-04-18 DIAGNOSIS — I110 Hypertensive heart disease with heart failure: Secondary | ICD-10-CM | POA: Insufficient documentation

## 2018-04-18 DIAGNOSIS — I890 Lymphedema, not elsewhere classified: Secondary | ICD-10-CM | POA: Diagnosis not present

## 2018-04-18 DIAGNOSIS — L97312 Non-pressure chronic ulcer of right ankle with fat layer exposed: Secondary | ICD-10-CM | POA: Insufficient documentation

## 2018-04-18 DIAGNOSIS — X58XXXA Exposure to other specified factors, initial encounter: Secondary | ICD-10-CM | POA: Insufficient documentation

## 2018-04-18 DIAGNOSIS — I509 Heart failure, unspecified: Secondary | ICD-10-CM | POA: Insufficient documentation

## 2018-04-26 IMAGING — DX DG ANKLE COMPLETE 3+V*L*
3 series · 3 of 3 positions shown · non-contrast
Comparison: 07/27/2016

CLINICAL DATA: Fall.  Left ankle pain

EXAM:
LEFT ANKLE COMPLETE - 3+ VIEW

[ankle ap]
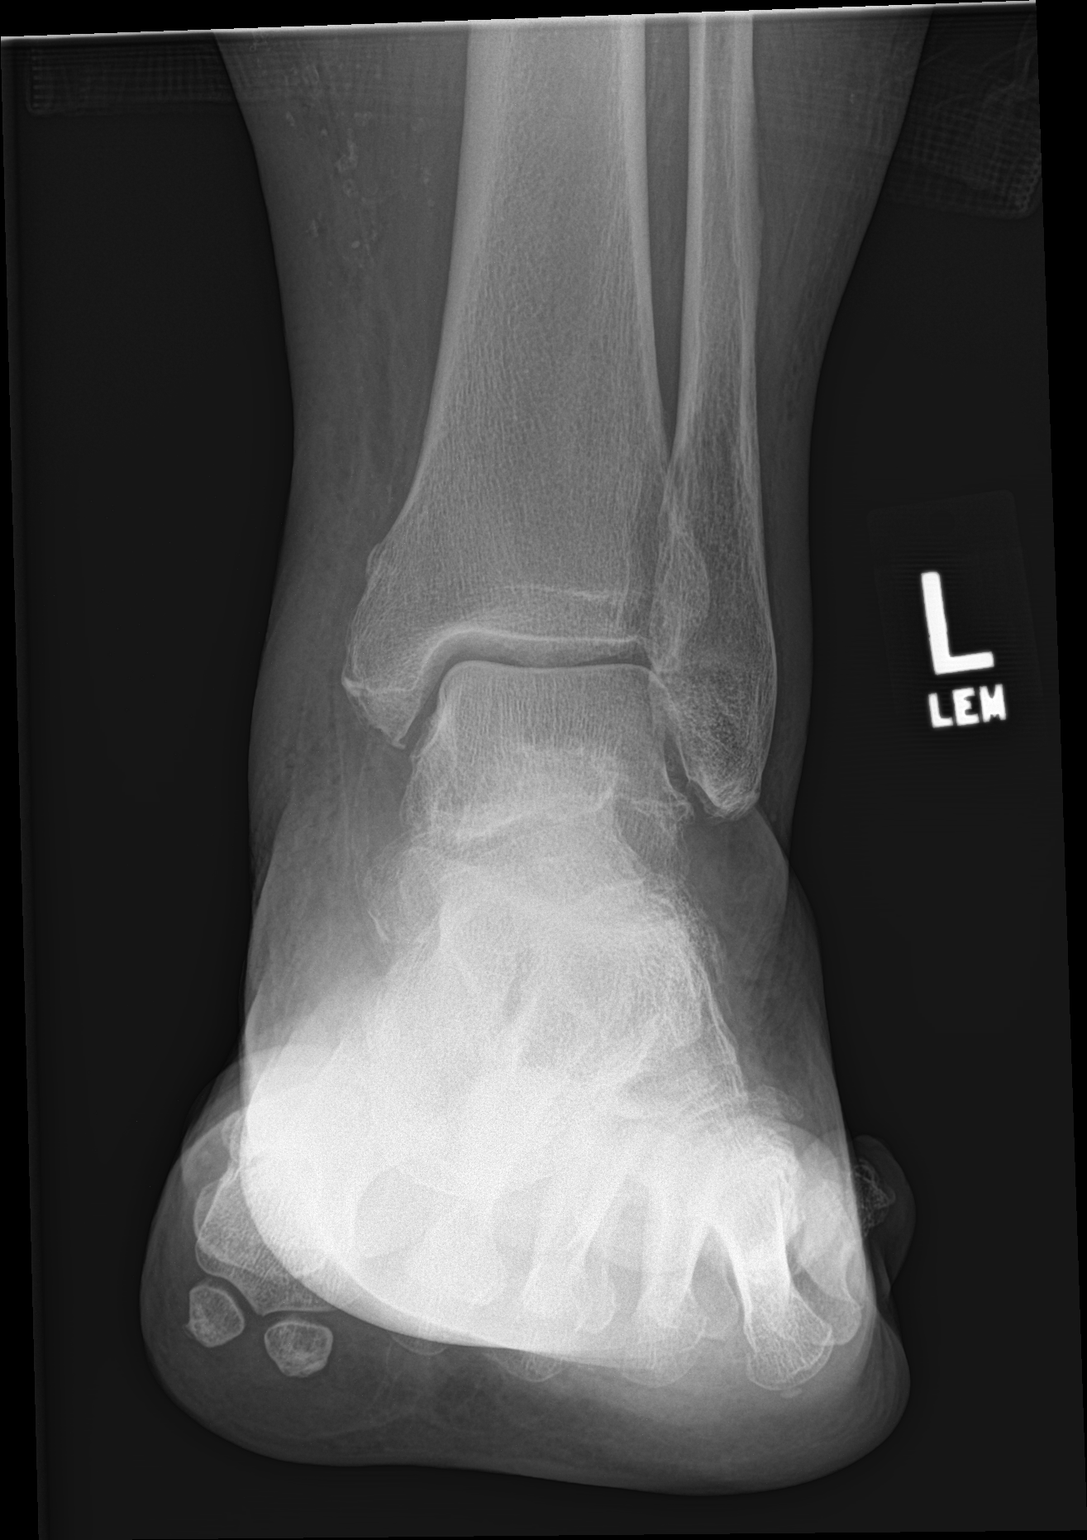

[ankle obl]
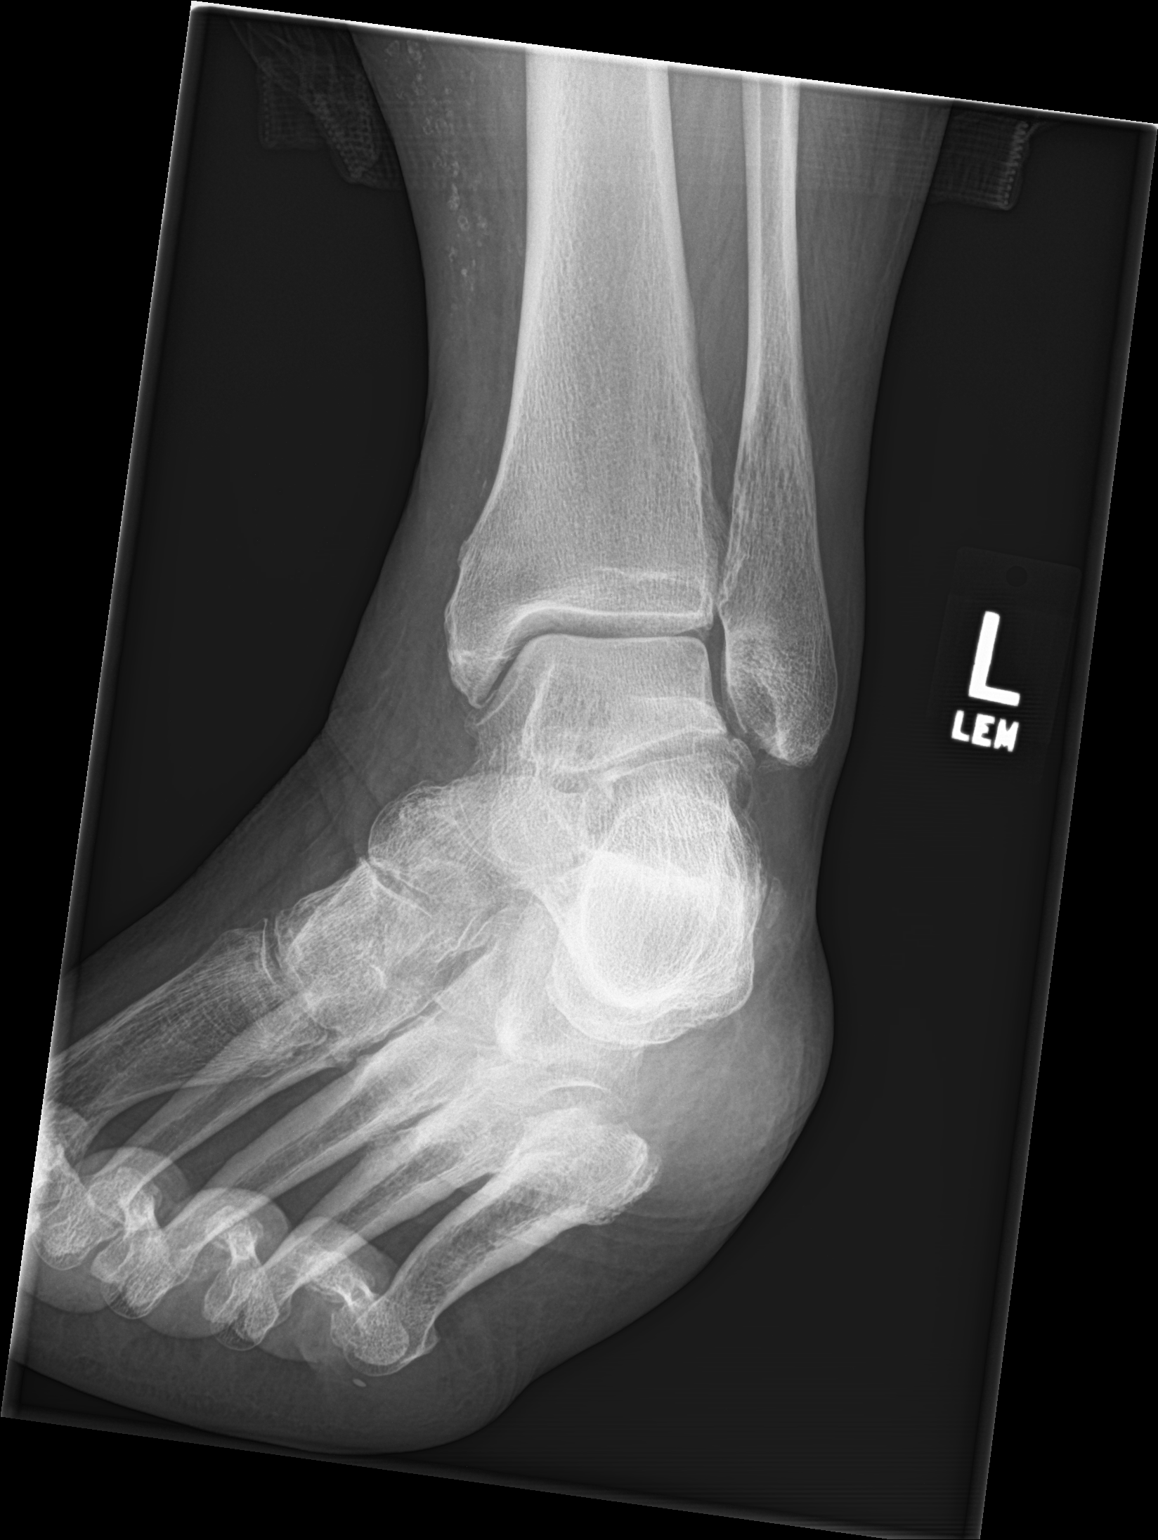

[ankle lat]
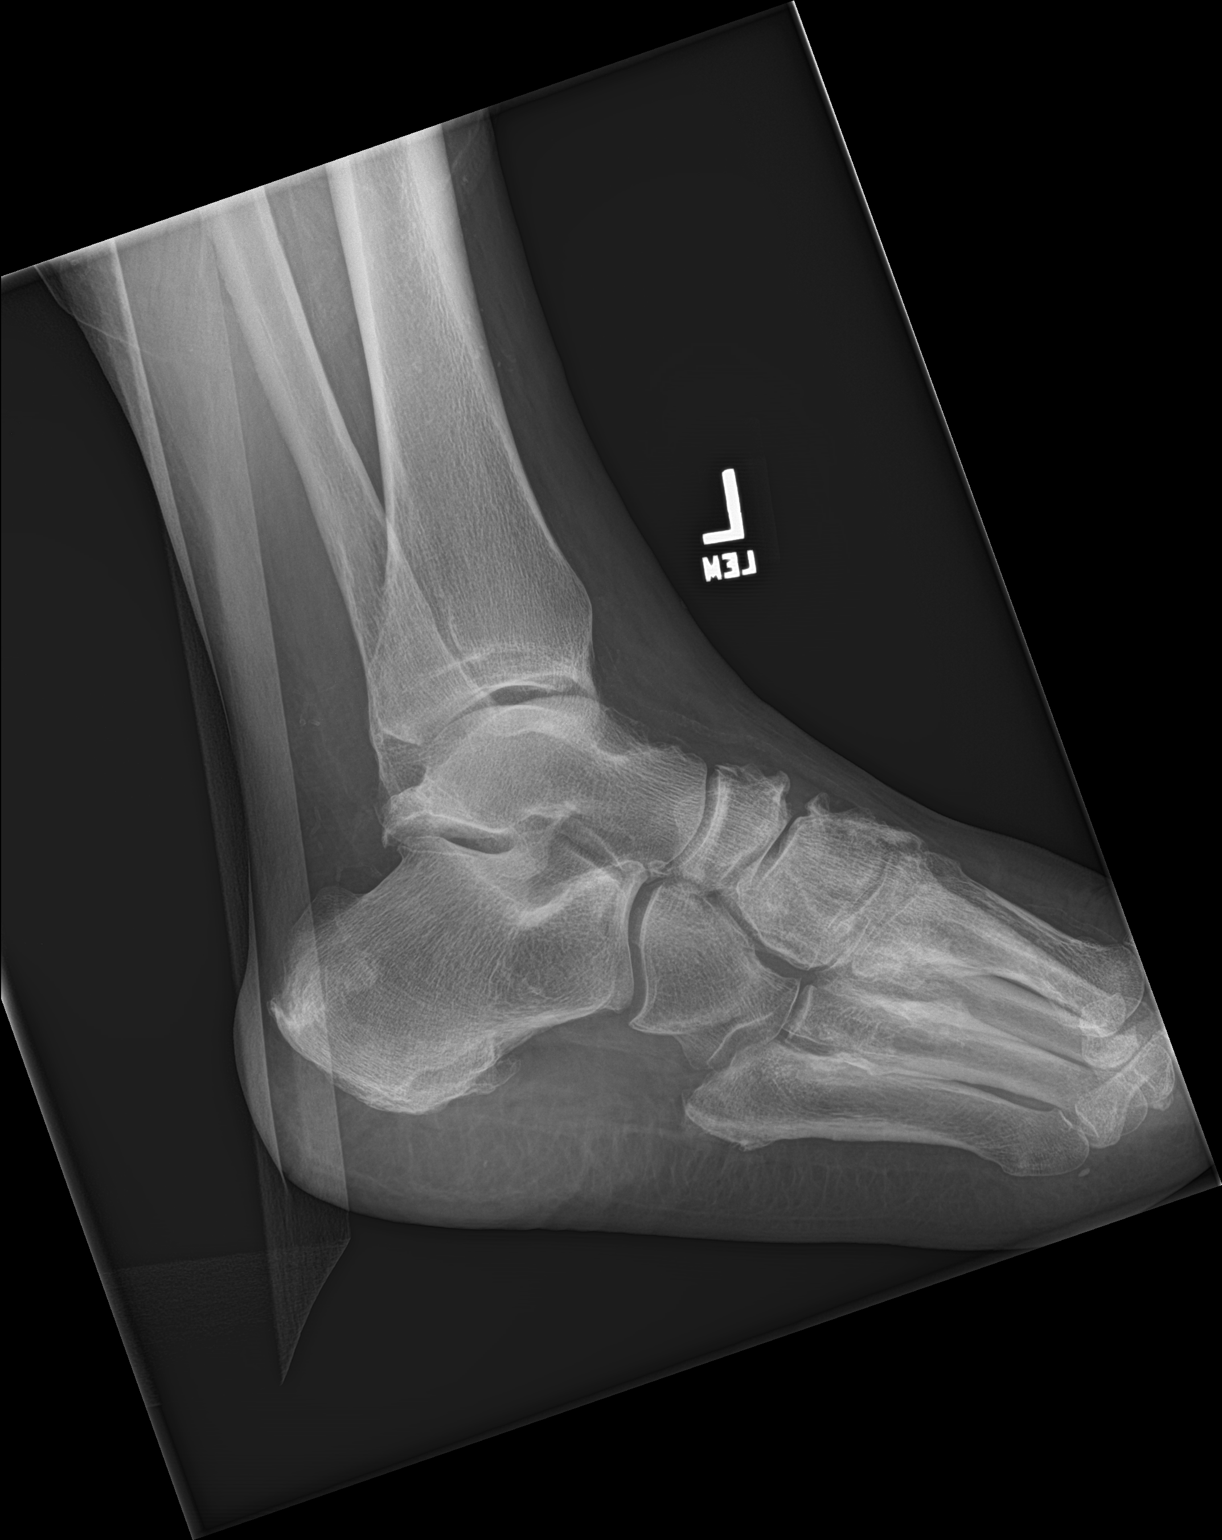

[3 of 3 positions shown; findings below may reference images not displayed]

FINDINGS: Medial soft tissue swelling. Normal alignment. Negative for acute
fracture. Mild degenerative change in the ankle joint. Arterial
calcification. Calcaneal spurring.
IMPRESSION: Medial soft tissue swelling.  Negative for ankle fracture.

## 2018-05-01 ENCOUNTER — Encounter: Payer: Self-pay | Admitting: Orthopaedic Surgery

## 2018-05-01 ENCOUNTER — Ambulatory Visit: Payer: Medicare HMO | Admitting: Orthopaedic Surgery

## 2018-05-01 DIAGNOSIS — G8929 Other chronic pain: Secondary | ICD-10-CM | POA: Diagnosis not present

## 2018-05-01 DIAGNOSIS — M25561 Pain in right knee: Secondary | ICD-10-CM | POA: Diagnosis not present

## 2018-05-01 DIAGNOSIS — Z6841 Body Mass Index (BMI) 40.0 and over, adult: Secondary | ICD-10-CM

## 2018-05-01 DIAGNOSIS — M25562 Pain in left knee: Secondary | ICD-10-CM | POA: Diagnosis not present

## 2018-05-01 MED ORDER — OXYCODONE HCL 15 MG PO TABS: ORAL_TABLET | ORAL | 0 refills | Status: DC

## 2018-05-01 NOTE — Progress Notes (Signed)
CC: Both of my knees are hurting. I would like an injection in both knees.  The patient has had chronic pain and tenderness of both knees for some time.  Injections help.  There is no locking or giving way of the knee.  There is no new trauma. There is no redness or signs of infections.  The knees have a mild effusion and some crepitus.  There is no redness or signs of recent trauma.  Right knee ROM is 0-90 and left knee ROM is 0-100.  Impression:  Chronic pain of the both knees  Return:  1 month  PROCEDURE NOTE:  The patient requests injections of both knees, verbal consent was obtained.  The left and right knee were individually prepped appropriately after time out was performed.   Sterile technique was observed and injection of 1 cc of Depo-Medrol 40 mg with several cc's of plain xylocaine. Anesthesia was provided by ethyl chloride and a 20-gauge needle was used to inject each knee area. The injections were tolerated well.  A band aid dressing was applied.  The patient was advised to apply ice later today and tomorrow to the injection sight as needed.  Encounter Diagnoses  Name Primary?  . Chronic pain of right knee Yes  . Chronic pain of left knee   . Morbid obesity due to excess calories ([place])   . Body mass index 70 and over, adult [facility])    The patient has read and signed an Opioid Treatment Agreement which has been scanned and added to the medical record.  The patient understands the agreement and agrees to abide with it.  The patient has chronic pain that is being treated with an opioid which relieves the pain.  The patient understands potential complications with chronic opioid treatment.   I have reviewed the [place] web site prior to prescribing narcotic medicine for this patient.   Electronically Signed Sanjuana Kava, MD 9/11/201910:21 AM

## 2018-05-02 ENCOUNTER — Ambulatory Visit: Payer: Medicare HMO | Admitting: Orthopaedic Surgery

## 2018-05-16 ENCOUNTER — Encounter (HOSPITAL_BASED_OUTPATIENT_CLINIC_OR_DEPARTMENT_OTHER): Payer: Medicare HMO | Attending: Internal Medicine

## 2018-05-16 DIAGNOSIS — L97312 Non-pressure chronic ulcer of right ankle with fat layer exposed: Secondary | ICD-10-CM | POA: Diagnosis not present

## 2018-05-16 DIAGNOSIS — I87331 Chronic venous hypertension (idiopathic) with ulcer and inflammation of right lower extremity: Secondary | ICD-10-CM | POA: Insufficient documentation

## 2018-05-16 DIAGNOSIS — I110 Hypertensive heart disease with heart failure: Secondary | ICD-10-CM | POA: Insufficient documentation

## 2018-05-16 DIAGNOSIS — X58XXXA Exposure to other specified factors, initial encounter: Secondary | ICD-10-CM | POA: Insufficient documentation

## 2018-05-16 DIAGNOSIS — I509 Heart failure, unspecified: Secondary | ICD-10-CM | POA: Diagnosis not present

## 2018-05-16 DIAGNOSIS — S31109A Unspecified open wound of abdominal wall, unspecified quadrant without penetration into peritoneal cavity, initial encounter: Secondary | ICD-10-CM | POA: Diagnosis not present

## 2018-05-16 DIAGNOSIS — Z6841 Body Mass Index (BMI) 40.0 and over, adult: Secondary | ICD-10-CM | POA: Diagnosis not present

## 2018-05-16 DIAGNOSIS — E1140 Type 2 diabetes mellitus with diabetic neuropathy, unspecified: Secondary | ICD-10-CM | POA: Diagnosis not present

## 2018-05-16 DIAGNOSIS — E1151 Type 2 diabetes mellitus with diabetic peripheral angiopathy without gangrene: Secondary | ICD-10-CM | POA: Insufficient documentation

## 2018-05-16 DIAGNOSIS — I890 Lymphedema, not elsewhere classified: Secondary | ICD-10-CM | POA: Diagnosis not present

## 2018-05-16 DIAGNOSIS — E11622 Type 2 diabetes mellitus with other skin ulcer: Secondary | ICD-10-CM | POA: Insufficient documentation

## 2018-05-16 DIAGNOSIS — G4730 Sleep apnea, unspecified: Secondary | ICD-10-CM | POA: Diagnosis not present

## 2018-05-16 DIAGNOSIS — L97812 Non-pressure chronic ulcer of other part of right lower leg with fat layer exposed: Secondary | ICD-10-CM | POA: Insufficient documentation

## 2018-05-16 DIAGNOSIS — L97112 Non-pressure chronic ulcer of right thigh with fat layer exposed: Secondary | ICD-10-CM | POA: Diagnosis not present

## 2018-05-29 ENCOUNTER — Ambulatory Visit: Payer: Medicare HMO | Admitting: Orthopaedic Surgery

## 2018-05-29 ENCOUNTER — Encounter: Payer: Self-pay | Admitting: Orthopaedic Surgery

## 2018-05-29 ENCOUNTER — Telehealth: Payer: Self-pay | Admitting: Orthopaedic Surgery

## 2018-05-29 DIAGNOSIS — M25561 Pain in right knee: Secondary | ICD-10-CM | POA: Diagnosis not present

## 2018-05-29 DIAGNOSIS — G8929 Other chronic pain: Secondary | ICD-10-CM | POA: Diagnosis not present

## 2018-05-29 DIAGNOSIS — M25562 Pain in left knee: Secondary | ICD-10-CM

## 2018-05-29 DIAGNOSIS — Z6841 Body Mass Index (BMI) 40.0 and over, adult: Secondary | ICD-10-CM

## 2018-05-29 MED ORDER — OXYCODONE HCL 15 MG PO TABS: ORAL_TABLET | ORAL | 0 refills | Status: DC

## 2018-05-29 NOTE — Telephone Encounter (Signed)
Patient called, relays that [facility] [phone], have not filled his prescription - patient states it was to have an override on it, and "it does not" according to pharmacy.  Please advise

## 2018-05-29 NOTE — Progress Notes (Signed)
CC: Both of my knees are hurting. I would like an injection in both knees.  The patient has had chronic pain and tenderness of both knees for some time.  Injections help.  There is no locking or giving way of the knee.  There is no new trauma. There is no redness or signs of infections.  The knees have a mild effusion and some crepitus.  There is no redness or signs of recent trauma.  Right knee ROM is 0-90 and left knee ROM is 0-95.  Impression:  Chronic pain of the both knees  Return:  1 month  PROCEDURE NOTE:  The patient requests injections of both knees, verbal consent was obtained.  The left and right knee were individually prepped appropriately after time out was performed.   Sterile technique was observed and injection of 1 cc of Depo-Medrol 40 mg with several cc's of plain xylocaine. Anesthesia was provided by ethyl chloride and a 20-gauge needle was used to inject each knee area. The injections were tolerated well.  A band aid dressing was applied.  The patient was advised to apply ice later today and tomorrow to the injection sight as needed.  The patient has read and signed an Opioid Treatment Agreement which has been scanned and added to the medical record.  The patient understands the agreement and agrees to abide with it.  The patient has chronic pain that is being treated with an opioid which relieves the pain.  The patient understands potential complications with chronic opioid treatment.    Electronically Signed Sanjuana Kava, MD 10/9/201910:17 AM

## 2018-06-13 ENCOUNTER — Encounter (HOSPITAL_BASED_OUTPATIENT_CLINIC_OR_DEPARTMENT_OTHER): Payer: Medicare HMO | Attending: Internal Medicine

## 2018-06-13 DIAGNOSIS — E11622 Type 2 diabetes mellitus with other skin ulcer: Secondary | ICD-10-CM | POA: Diagnosis not present

## 2018-06-13 DIAGNOSIS — L97112 Non-pressure chronic ulcer of right thigh with fat layer exposed: Secondary | ICD-10-CM | POA: Diagnosis not present

## 2018-06-13 DIAGNOSIS — I10 Essential (primary) hypertension: Secondary | ICD-10-CM | POA: Diagnosis not present

## 2018-06-13 DIAGNOSIS — S31103A Unspecified open wound of abdominal wall, right lower quadrant without penetration into peritoneal cavity, initial encounter: Secondary | ICD-10-CM | POA: Insufficient documentation

## 2018-06-13 DIAGNOSIS — I890 Lymphedema, not elsewhere classified: Secondary | ICD-10-CM | POA: Insufficient documentation

## 2018-06-13 DIAGNOSIS — L97812 Non-pressure chronic ulcer of other part of right lower leg with fat layer exposed: Secondary | ICD-10-CM | POA: Diagnosis not present

## 2018-06-13 DIAGNOSIS — E1140 Type 2 diabetes mellitus with diabetic neuropathy, unspecified: Secondary | ICD-10-CM | POA: Diagnosis not present

## 2018-06-13 DIAGNOSIS — Z6841 Body Mass Index (BMI) 40.0 and over, adult: Secondary | ICD-10-CM | POA: Diagnosis not present

## 2018-06-13 DIAGNOSIS — G4730 Sleep apnea, unspecified: Secondary | ICD-10-CM | POA: Insufficient documentation

## 2018-06-13 DIAGNOSIS — X58XXXA Exposure to other specified factors, initial encounter: Secondary | ICD-10-CM | POA: Diagnosis not present

## 2018-06-26 ENCOUNTER — Encounter: Payer: Self-pay | Admitting: Orthopaedic Surgery

## 2018-06-26 ENCOUNTER — Ambulatory Visit: Payer: Medicare HMO | Admitting: Orthopaedic Surgery

## 2018-06-26 DIAGNOSIS — Z6841 Body Mass Index (BMI) 40.0 and over, adult: Secondary | ICD-10-CM

## 2018-06-26 DIAGNOSIS — M25561 Pain in right knee: Secondary | ICD-10-CM

## 2018-06-26 DIAGNOSIS — G8929 Other chronic pain: Secondary | ICD-10-CM | POA: Diagnosis not present

## 2018-06-26 DIAGNOSIS — M25562 Pain in left knee: Secondary | ICD-10-CM | POA: Diagnosis not present

## 2018-06-26 MED ORDER — OXYCODONE HCL 15 MG PO TABS: ORAL_TABLET | ORAL | 0 refills | Status: DC

## 2018-06-26 NOTE — Progress Notes (Signed)
CC: Both of my knees are hurting. I would like an injection in both knees.  The patient has had chronic pain and tenderness of both knees for some time.  Injections help.  There is no locking or giving way of the knee.  There is no new trauma. There is no redness or signs of infections.  The knees have a mild effusion and some crepitus.  There is no redness or signs of recent trauma.  Right knee ROM is 0-100 and left knee ROM is 0-95.  Impression:  Chronic pain of the both knees  Return:  1 month  PROCEDURE NOTE:  The patient requests injections of both knees, verbal consent was obtained.  The left and right knee were individually prepped appropriately after time out was performed.   Sterile technique was observed and injection of 1 cc of Depo-Medrol 40 mg with several cc's of plain xylocaine. Anesthesia was provided by ethyl chloride and a 20-gauge needle was used to inject each knee area. The injections were tolerated well.  A band aid dressing was applied.  The patient was advised to apply ice later today and tomorrow to the injection sight as needed.  I have reviewed the [place] web site prior to prescribing narcotic medicine for this patient.   Electronically Signed Sanjuana Kava, MD 11/6/20199:14 AM

## 2018-07-04 ENCOUNTER — Encounter (HOSPITAL_BASED_OUTPATIENT_CLINIC_OR_DEPARTMENT_OTHER): Payer: Medicare HMO | Attending: Internal Medicine

## 2018-07-04 DIAGNOSIS — X58XXXA Exposure to other specified factors, initial encounter: Secondary | ICD-10-CM | POA: Diagnosis not present

## 2018-07-04 DIAGNOSIS — L97112 Non-pressure chronic ulcer of right thigh with fat layer exposed: Secondary | ICD-10-CM | POA: Diagnosis not present

## 2018-07-04 DIAGNOSIS — I890 Lymphedema, not elsewhere classified: Secondary | ICD-10-CM | POA: Insufficient documentation

## 2018-07-04 DIAGNOSIS — L97812 Non-pressure chronic ulcer of other part of right lower leg with fat layer exposed: Secondary | ICD-10-CM | POA: Diagnosis not present

## 2018-07-04 DIAGNOSIS — G4730 Sleep apnea, unspecified: Secondary | ICD-10-CM | POA: Insufficient documentation

## 2018-07-04 DIAGNOSIS — Z6841 Body Mass Index (BMI) 40.0 and over, adult: Secondary | ICD-10-CM | POA: Insufficient documentation

## 2018-07-04 DIAGNOSIS — E1140 Type 2 diabetes mellitus with diabetic neuropathy, unspecified: Secondary | ICD-10-CM | POA: Insufficient documentation

## 2018-07-04 DIAGNOSIS — E11622 Type 2 diabetes mellitus with other skin ulcer: Secondary | ICD-10-CM | POA: Insufficient documentation

## 2018-07-04 DIAGNOSIS — S31103A Unspecified open wound of abdominal wall, right lower quadrant without penetration into peritoneal cavity, initial encounter: Secondary | ICD-10-CM | POA: Diagnosis not present

## 2018-07-04 DIAGNOSIS — I10 Essential (primary) hypertension: Secondary | ICD-10-CM | POA: Diagnosis not present

## 2018-07-25 ENCOUNTER — Encounter: Payer: Self-pay | Admitting: Orthopaedic Surgery

## 2018-07-25 ENCOUNTER — Ambulatory Visit (INDEPENDENT_AMBULATORY_CARE_PROVIDER_SITE_OTHER): Payer: Medicare HMO | Admitting: Orthopaedic Surgery

## 2018-07-25 ENCOUNTER — Telehealth: Payer: Self-pay | Admitting: Orthopaedic Surgery

## 2018-07-25 DIAGNOSIS — M25561 Pain in right knee: Secondary | ICD-10-CM | POA: Diagnosis not present

## 2018-07-25 DIAGNOSIS — M25562 Pain in left knee: Secondary | ICD-10-CM

## 2018-07-25 DIAGNOSIS — G8929 Other chronic pain: Secondary | ICD-10-CM

## 2018-07-25 DIAGNOSIS — Z6841 Body Mass Index (BMI) 40.0 and over, adult: Secondary | ICD-10-CM

## 2018-07-25 MED ORDER — OXYCODONE HCL 15 MG PO TABS: ORAL_TABLET | ORAL | 0 refills | Status: DC

## 2018-07-25 NOTE — Progress Notes (Signed)
CC: Both of my knees are hurting. I would like an injection in both knees.  The patient has had chronic pain and tenderness of both knees for some time.  Injections help.  There is no locking or giving way of the knee.  There is no new trauma. There is no redness or signs of infections.  The knees have a mild effusion and some crepitus.  There is no redness or signs of recent trauma.  Right knee ROM is 0-95 and left knee ROM is 0-90.  Impression:  Chronic pain of the both knees  Return:  1 month  PROCEDURE NOTE:  The patient requests injections of both knees, verbal consent was obtained.  The left and right knee were individually prepped appropriately after time out was performed.   Sterile technique was observed and injection of 1 cc of Depo-Medrol 40 mg with several cc's of plain xylocaine. Anesthesia was provided by ethyl chloride and a 20-gauge needle was used to inject each knee area. The injections were tolerated well.  A band aid dressing was applied.  The patient was advised to apply ice later today and tomorrow to the injection sight as needed.  The patient has read and signed an Opioid Treatment Agreement which has been scanned and added to the medical record.  The patient understands the agreement and agrees to abide with it.  The patient has chronic pain that is being treated with an opioid which relieves the pain.  The patient understands potential complications with chronic opioid treatment.   I have reviewed the [place] web site prior to prescribing narcotic medicine for this patient.  Encounter Diagnoses  Name Primary?  . Chronic pain of right knee   . Chronic pain of left knee   . Morbid obesity due to excess calories ([place])   . Body mass index 70 and over, adult ([place]) Yes   The patient meets the AMA guidelines for Morbid (severe) obesity with a BMI > 40.0 and I have recommended weight loss.   Electronically  Signed Sanjuana Kava, MD 12/5/20199:01 AM

## 2018-07-25 NOTE — Telephone Encounter (Signed)
Oxycodone 15 mg  Qty 160 Tablets  [facility]  [facility]

## 2018-08-01 ENCOUNTER — Encounter (HOSPITAL_BASED_OUTPATIENT_CLINIC_OR_DEPARTMENT_OTHER): Payer: Medicare HMO | Attending: Family Medicine

## 2018-08-01 ENCOUNTER — Encounter (HOSPITAL_BASED_OUTPATIENT_CLINIC_OR_DEPARTMENT_OTHER): Payer: Self-pay

## 2018-08-01 DIAGNOSIS — Z6841 Body Mass Index (BMI) 40.0 and over, adult: Secondary | ICD-10-CM | POA: Insufficient documentation

## 2018-08-01 DIAGNOSIS — I509 Heart failure, unspecified: Secondary | ICD-10-CM | POA: Diagnosis not present

## 2018-08-01 DIAGNOSIS — I890 Lymphedema, not elsewhere classified: Secondary | ICD-10-CM | POA: Insufficient documentation

## 2018-08-01 DIAGNOSIS — I87331 Chronic venous hypertension (idiopathic) with ulcer and inflammation of right lower extremity: Secondary | ICD-10-CM | POA: Diagnosis present

## 2018-08-01 DIAGNOSIS — L97112 Non-pressure chronic ulcer of right thigh with fat layer exposed: Secondary | ICD-10-CM | POA: Diagnosis not present

## 2018-08-01 DIAGNOSIS — X58XXXA Exposure to other specified factors, initial encounter: Secondary | ICD-10-CM | POA: Diagnosis not present

## 2018-08-01 DIAGNOSIS — S31103A Unspecified open wound of abdominal wall, right lower quadrant without penetration into peritoneal cavity, initial encounter: Secondary | ICD-10-CM | POA: Diagnosis not present

## 2018-08-01 DIAGNOSIS — E11622 Type 2 diabetes mellitus with other skin ulcer: Secondary | ICD-10-CM | POA: Diagnosis not present

## 2018-08-01 DIAGNOSIS — I110 Hypertensive heart disease with heart failure: Secondary | ICD-10-CM | POA: Diagnosis not present

## 2018-08-01 DIAGNOSIS — E1140 Type 2 diabetes mellitus with diabetic neuropathy, unspecified: Secondary | ICD-10-CM | POA: Diagnosis not present

## 2018-08-01 DIAGNOSIS — G4730 Sleep apnea, unspecified: Secondary | ICD-10-CM | POA: Diagnosis not present

## 2018-08-01 DIAGNOSIS — L97812 Non-pressure chronic ulcer of other part of right lower leg with fat layer exposed: Secondary | ICD-10-CM | POA: Insufficient documentation

## 2018-08-27 ENCOUNTER — Ambulatory Visit: Payer: Medicare HMO | Admitting: Orthopaedic Surgery

## 2018-08-27 ENCOUNTER — Encounter: Payer: Self-pay | Admitting: Orthopaedic Surgery

## 2018-08-27 DIAGNOSIS — M25561 Pain in right knee: Secondary | ICD-10-CM | POA: Diagnosis not present

## 2018-08-27 DIAGNOSIS — M25562 Pain in left knee: Secondary | ICD-10-CM | POA: Diagnosis not present

## 2018-08-27 DIAGNOSIS — G8929 Other chronic pain: Secondary | ICD-10-CM | POA: Diagnosis not present

## 2018-08-27 MED ORDER — OXYCODONE HCL 15 MG PO TABS: ORAL_TABLET | ORAL | 0 refills | Status: DC

## 2018-08-27 NOTE — Progress Notes (Signed)
CC: Both of my knees are hurting. I would like an injection in both knees.  The patient has had chronic pain and tenderness of both knees for some time.  Injections help.  There is no locking or giving way of the knee.  There is no new trauma. There is no redness or signs of infections.  The knees have a mild effusion and some crepitus.  There is no redness or signs of recent trauma.  Right knee ROM is 0-90 and left knee ROM is 0-95.  Impression:  Chronic pain of the both knees  Return:  1 month  PROCEDURE NOTE:  The patient requests injections of both knees, verbal consent was obtained.  The left and right knee were individually prepped appropriately after time out was performed.   Sterile technique was observed and injection of 1 cc of Depo-Medrol 40 mg with several cc's of plain xylocaine. Anesthesia was provided by ethyl chloride and a 20-gauge needle was used to inject each knee area. The injections were tolerated well.  A band aid dressing was applied.  The patient was advised to apply ice later today and tomorrow to the injection sight as needed.  The patient has read and signed an Opioid Treatment Agreement which has been scanned and added to the medical record.  The patient understands the agreement and agrees to abide with it.  The patient has chronic pain that is being treated with an opioid which relieves the pain.  The patient understands potential complications with chronic opioid treatment.   I have reviewed the [place] web site prior to prescribing narcotic medicine for this patient.   Electronically Signed Sanjuana Kava, MD 1/7/202010:02 AM

## 2018-08-30 ENCOUNTER — Encounter (HOSPITAL_BASED_OUTPATIENT_CLINIC_OR_DEPARTMENT_OTHER): Payer: Medicare HMO | Attending: Internal Medicine

## 2018-08-30 DIAGNOSIS — Z6841 Body Mass Index (BMI) 40.0 and over, adult: Secondary | ICD-10-CM | POA: Diagnosis not present

## 2018-08-30 DIAGNOSIS — I890 Lymphedema, not elsewhere classified: Secondary | ICD-10-CM | POA: Insufficient documentation

## 2018-08-30 DIAGNOSIS — L97112 Non-pressure chronic ulcer of right thigh with fat layer exposed: Secondary | ICD-10-CM | POA: Diagnosis not present

## 2018-08-30 DIAGNOSIS — E11622 Type 2 diabetes mellitus with other skin ulcer: Secondary | ICD-10-CM | POA: Diagnosis not present

## 2018-08-30 DIAGNOSIS — L97212 Non-pressure chronic ulcer of right calf with fat layer exposed: Secondary | ICD-10-CM | POA: Diagnosis not present

## 2018-08-30 DIAGNOSIS — S31103A Unspecified open wound of abdominal wall, right lower quadrant without penetration into peritoneal cavity, initial encounter: Secondary | ICD-10-CM | POA: Diagnosis not present

## 2018-08-30 DIAGNOSIS — X58XXXA Exposure to other specified factors, initial encounter: Secondary | ICD-10-CM | POA: Insufficient documentation

## 2018-09-26 ENCOUNTER — Ambulatory Visit: Payer: Medicare HMO | Admitting: Orthopaedic Surgery

## 2018-09-26 ENCOUNTER — Encounter: Payer: Self-pay | Admitting: Orthopaedic Surgery

## 2018-09-26 DIAGNOSIS — M25561 Pain in right knee: Secondary | ICD-10-CM

## 2018-09-26 DIAGNOSIS — M25562 Pain in left knee: Secondary | ICD-10-CM

## 2018-09-26 DIAGNOSIS — G8929 Other chronic pain: Secondary | ICD-10-CM | POA: Diagnosis not present

## 2018-09-26 MED ORDER — OXYCODONE HCL 15 MG PO TABS: ORAL_TABLET | ORAL | 0 refills | Status: DC

## 2018-09-26 NOTE — Progress Notes (Signed)
PROCEDURE NOTE:  The patient requests injections of the left knee , verbal consent was obtained.  The left knee was prepped appropriately after time out was performed.   Sterile technique was observed and injection of 1 cc of Depo-Medrol 40 mg with several cc's of plain xylocaine. Anesthesia was provided by ethyl chloride and a 20-gauge needle was used to inject the knee area. The injection was tolerated well.  A band aid dressing was applied.  The patient was advised to apply ice later today and tomorrow to the injection sight as needed.  PROCEDURE NOTE:  The patient requests injections of the right knee , verbal consent was obtained.  The right knee was prepped appropriately after time out was performed.   Sterile technique was observed and injection of 1 cc of Depo-Medrol 40 mg with several cc's of plain xylocaine. Anesthesia was provided by ethyl chloride and a 20-gauge needle was used to inject the knee area. The injection was tolerated well.  A band aid dressing was applied.  The patient was advised to apply ice later today and tomorrow to the injection sight as needed.  The patient has read and signed an Opioid Treatment Agreement which has been scanned and added to the medical record.  The patient understands the agreement and agrees to abide with it.  The patient has chronic pain that is being treated with an opioid which relieves the pain.  The patient understands potential complications with chronic opioid treatment.   I have reviewed the [place] web site prior to prescribing narcotic medicine for this patient.   Electronically Signed Sanjuana Kava, MD 2/6/20209:04 AM

## 2018-09-27 ENCOUNTER — Encounter (HOSPITAL_BASED_OUTPATIENT_CLINIC_OR_DEPARTMENT_OTHER): Payer: Medicare HMO | Attending: Internal Medicine

## 2018-09-27 DIAGNOSIS — I890 Lymphedema, not elsewhere classified: Secondary | ICD-10-CM | POA: Insufficient documentation

## 2018-09-27 DIAGNOSIS — I87331 Chronic venous hypertension (idiopathic) with ulcer and inflammation of right lower extremity: Secondary | ICD-10-CM | POA: Insufficient documentation

## 2018-09-27 DIAGNOSIS — L97112 Non-pressure chronic ulcer of right thigh with fat layer exposed: Secondary | ICD-10-CM | POA: Diagnosis not present

## 2018-09-27 DIAGNOSIS — X58XXXA Exposure to other specified factors, initial encounter: Secondary | ICD-10-CM | POA: Insufficient documentation

## 2018-09-27 DIAGNOSIS — G4730 Sleep apnea, unspecified: Secondary | ICD-10-CM | POA: Diagnosis not present

## 2018-09-27 DIAGNOSIS — S31103A Unspecified open wound of abdominal wall, right lower quadrant without penetration into peritoneal cavity, initial encounter: Secondary | ICD-10-CM | POA: Diagnosis present

## 2018-09-27 DIAGNOSIS — I509 Heart failure, unspecified: Secondary | ICD-10-CM | POA: Diagnosis not present

## 2018-09-27 DIAGNOSIS — Z6841 Body Mass Index (BMI) 40.0 and over, adult: Secondary | ICD-10-CM | POA: Diagnosis not present

## 2018-09-27 DIAGNOSIS — L97812 Non-pressure chronic ulcer of other part of right lower leg with fat layer exposed: Secondary | ICD-10-CM | POA: Diagnosis not present

## 2018-09-27 DIAGNOSIS — E11622 Type 2 diabetes mellitus with other skin ulcer: Secondary | ICD-10-CM | POA: Diagnosis not present

## 2018-09-27 DIAGNOSIS — E1151 Type 2 diabetes mellitus with diabetic peripheral angiopathy without gangrene: Secondary | ICD-10-CM | POA: Diagnosis not present

## 2018-09-27 DIAGNOSIS — E1140 Type 2 diabetes mellitus with diabetic neuropathy, unspecified: Secondary | ICD-10-CM | POA: Insufficient documentation

## 2018-09-27 DIAGNOSIS — I110 Hypertensive heart disease with heart failure: Secondary | ICD-10-CM | POA: Diagnosis not present

## 2018-10-09 ENCOUNTER — Telehealth: Payer: Self-pay | Admitting: Orthopaedic Surgery

## 2018-10-09 NOTE — Telephone Encounter (Signed)
No, not really. I cannot give any narcotic until due.  That is the new law.  Tylenol, Advil or Aleve.

## 2018-10-09 NOTE — Telephone Encounter (Signed)
Called patient and relayed message. He was very understanding and apologetic for having to ask.

## 2018-10-09 NOTE — Telephone Encounter (Signed)
[name] states that his medication was dropped down the drain and scattered everywhere.  [name] is asking if there is something you can prescribe for him to take until he can get his refill in March. I told him that I didn't know if there was something or not, but would send you the request.   [facility]

## 2018-10-10 ENCOUNTER — Other Ambulatory Visit: Payer: Self-pay | Admitting: Orthopaedic Surgery

## 2018-10-15 DIAGNOSIS — K640 First degree hemorrhoids: Secondary | ICD-10-CM | POA: Insufficient documentation

## 2018-10-24 ENCOUNTER — Encounter: Payer: Self-pay | Admitting: Orthopaedic Surgery

## 2018-10-24 ENCOUNTER — Ambulatory Visit: Payer: Medicare HMO | Admitting: Orthopaedic Surgery

## 2018-10-24 DIAGNOSIS — M25562 Pain in left knee: Secondary | ICD-10-CM | POA: Diagnosis not present

## 2018-10-24 DIAGNOSIS — G8929 Other chronic pain: Secondary | ICD-10-CM | POA: Diagnosis not present

## 2018-10-24 DIAGNOSIS — M25561 Pain in right knee: Secondary | ICD-10-CM

## 2018-10-24 MED ORDER — OXYCODONE HCL 15 MG PO TABS: ORAL_TABLET | ORAL | 0 refills | Status: DC

## 2018-10-24 NOTE — Progress Notes (Signed)
PROCEDURE NOTE:  The patient requests injections of the left knee , verbal consent was obtained.  The left knee was prepped appropriately after time out was performed.   Sterile technique was observed and injection of 1 cc of Depo-Medrol 40 mg with several cc's of plain xylocaine. Anesthesia was provided by ethyl chloride and a 20-gauge needle was used to inject the knee area. The injection was tolerated well.  A band aid dressing was applied.  The patient was advised to apply ice later today and tomorrow to the injection sight as needed.  PROCEDURE NOTE:  The patient requests injections of the right knee , verbal consent was obtained.  The right knee was prepped appropriately after time out was performed.   Sterile technique was observed and injection of 1 cc of Depo-Medrol 40 mg with several cc's of plain xylocaine. Anesthesia was provided by ethyl chloride and a 20-gauge needle was used to inject the knee area. The injection was tolerated well.  A band aid dressing was applied.  The patient was advised to apply ice later today and tomorrow to the injection sight as needed.  Encounter Diagnoses  Name Primary?  . Chronic pain of right knee   . Chronic pain of left knee   . Morbid obesity due to excess calories [facility])    The patient has read and signed an Opioid Treatment Agreement which has been scanned and added to the medical record.  The patient understands the agreement and agrees to abide with it.  The patient has chronic pain that is being treated with an opioid which relieves the pain.  The patient understands potential complications with chronic opioid treatment.   I have reviewed the [place] web site prior to prescribing narcotic medicine for this patient.   Electronically Signed Sanjuana Kava, MD 3/5/202010:35 AM

## 2018-10-25 ENCOUNTER — Encounter (HOSPITAL_BASED_OUTPATIENT_CLINIC_OR_DEPARTMENT_OTHER): Payer: 59 | Attending: Internal Medicine

## 2018-10-25 DIAGNOSIS — E11622 Type 2 diabetes mellitus with other skin ulcer: Secondary | ICD-10-CM | POA: Diagnosis not present

## 2018-10-25 DIAGNOSIS — I890 Lymphedema, not elsewhere classified: Secondary | ICD-10-CM | POA: Insufficient documentation

## 2018-10-25 DIAGNOSIS — L97212 Non-pressure chronic ulcer of right calf with fat layer exposed: Secondary | ICD-10-CM | POA: Diagnosis not present

## 2018-10-25 DIAGNOSIS — G4730 Sleep apnea, unspecified: Secondary | ICD-10-CM | POA: Diagnosis not present

## 2018-10-25 DIAGNOSIS — S31103A Unspecified open wound of abdominal wall, right lower quadrant without penetration into peritoneal cavity, initial encounter: Secondary | ICD-10-CM | POA: Diagnosis not present

## 2018-10-25 DIAGNOSIS — I110 Hypertensive heart disease with heart failure: Secondary | ICD-10-CM | POA: Diagnosis not present

## 2018-10-25 DIAGNOSIS — Z6841 Body Mass Index (BMI) 40.0 and over, adult: Secondary | ICD-10-CM | POA: Insufficient documentation

## 2018-10-25 DIAGNOSIS — E1140 Type 2 diabetes mellitus with diabetic neuropathy, unspecified: Secondary | ICD-10-CM | POA: Insufficient documentation

## 2018-10-25 DIAGNOSIS — L97112 Non-pressure chronic ulcer of right thigh with fat layer exposed: Secondary | ICD-10-CM | POA: Insufficient documentation

## 2018-10-25 DIAGNOSIS — I509 Heart failure, unspecified: Secondary | ICD-10-CM | POA: Diagnosis not present

## 2018-10-25 DIAGNOSIS — X58XXXA Exposure to other specified factors, initial encounter: Secondary | ICD-10-CM | POA: Diagnosis not present

## 2018-11-20 ENCOUNTER — Telehealth (INDEPENDENT_AMBULATORY_CARE_PROVIDER_SITE_OTHER): Payer: Self-pay | Admitting: Radiology

## 2018-11-20 ENCOUNTER — Telehealth: Payer: Self-pay | Admitting: Orthopaedic Surgery

## 2018-11-20 DIAGNOSIS — G8929 Other chronic pain: Secondary | ICD-10-CM

## 2018-11-20 DIAGNOSIS — M25562 Pain in left knee: Secondary | ICD-10-CM

## 2018-11-20 DIAGNOSIS — M25561 Pain in right knee: Principal | ICD-10-CM

## 2018-11-20 NOTE — Telephone Encounter (Signed)
I called patient and discussed appt and he is ok to push appt out some.  I resched for [name] 12, sending this to you to remind you about sending in his prescriptions. Thanks.

## 2018-11-20 NOTE — Telephone Encounter (Signed)
Oxycodone 15 mg immediate release tablet  Qty  160 Tablets  [facility]  Patient is asking if he could pick this prescription up tomorrow.

## 2018-11-21 ENCOUNTER — Ambulatory Visit: Payer: Medicare HMO | Admitting: Orthopaedic Surgery

## 2018-11-21 MED ORDER — OXYCODONE HCL 15 MG PO TABS: ORAL_TABLET | ORAL | 0 refills | Status: DC

## 2018-11-21 NOTE — Addendum Note (Signed)
Addended by: Willette Pa on: 11/21/2018 09:57 AM   Modules accepted: Orders

## 2018-11-22 ENCOUNTER — Encounter (HOSPITAL_BASED_OUTPATIENT_CLINIC_OR_DEPARTMENT_OTHER): Payer: 59 | Attending: Internal Medicine

## 2018-12-10 ENCOUNTER — Encounter: Payer: Self-pay | Admitting: Orthopaedic Surgery

## 2018-12-13 NOTE — Telephone Encounter (Signed)
ERROR

## 2018-12-18 ENCOUNTER — Telehealth: Payer: Self-pay | Admitting: Orthopaedic Surgery

## 2018-12-18 DIAGNOSIS — G8929 Other chronic pain: Secondary | ICD-10-CM

## 2018-12-18 DIAGNOSIS — M25562 Pain in left knee: Secondary | ICD-10-CM

## 2018-12-18 DIAGNOSIS — M25561 Pain in right knee: Principal | ICD-10-CM

## 2018-12-18 MED ORDER — OXYCODONE HCL 15 MG PO TABS: ORAL_TABLET | ORAL | 0 refills | Status: DC

## 2018-12-18 NOTE — Telephone Encounter (Signed)
Oxycodone-15 MG Qty 160 Tablets  [facility] [place]

## 2018-12-26 ENCOUNTER — Encounter (HOSPITAL_BASED_OUTPATIENT_CLINIC_OR_DEPARTMENT_OTHER): Payer: Medicare HMO | Attending: Internal Medicine

## 2018-12-26 DIAGNOSIS — E11622 Type 2 diabetes mellitus with other skin ulcer: Secondary | ICD-10-CM | POA: Diagnosis present

## 2018-12-26 DIAGNOSIS — E1140 Type 2 diabetes mellitus with diabetic neuropathy, unspecified: Secondary | ICD-10-CM | POA: Diagnosis not present

## 2018-12-26 DIAGNOSIS — I110 Hypertensive heart disease with heart failure: Secondary | ICD-10-CM | POA: Diagnosis not present

## 2018-12-26 DIAGNOSIS — Z6841 Body Mass Index (BMI) 40.0 and over, adult: Secondary | ICD-10-CM | POA: Insufficient documentation

## 2018-12-26 DIAGNOSIS — L97812 Non-pressure chronic ulcer of other part of right lower leg with fat layer exposed: Secondary | ICD-10-CM | POA: Insufficient documentation

## 2018-12-26 DIAGNOSIS — I509 Heart failure, unspecified: Secondary | ICD-10-CM | POA: Insufficient documentation

## 2018-12-26 DIAGNOSIS — S31103A Unspecified open wound of abdominal wall, right lower quadrant without penetration into peritoneal cavity, initial encounter: Secondary | ICD-10-CM | POA: Diagnosis not present

## 2018-12-26 DIAGNOSIS — X58XXXA Exposure to other specified factors, initial encounter: Secondary | ICD-10-CM | POA: Insufficient documentation

## 2018-12-26 DIAGNOSIS — I890 Lymphedema, not elsewhere classified: Secondary | ICD-10-CM | POA: Diagnosis not present

## 2018-12-26 DIAGNOSIS — G4730 Sleep apnea, unspecified: Secondary | ICD-10-CM | POA: Insufficient documentation

## 2018-12-31 ENCOUNTER — Encounter: Payer: Self-pay | Admitting: Orthopaedic Surgery

## 2018-12-31 ENCOUNTER — Ambulatory Visit: Payer: Medicare HMO | Admitting: Orthopaedic Surgery

## 2018-12-31 ENCOUNTER — Other Ambulatory Visit: Payer: Self-pay

## 2018-12-31 VITALS — Temp 99.7°F

## 2018-12-31 DIAGNOSIS — M25561 Pain in right knee: Secondary | ICD-10-CM | POA: Diagnosis not present

## 2018-12-31 DIAGNOSIS — G8929 Other chronic pain: Secondary | ICD-10-CM

## 2018-12-31 DIAGNOSIS — Z6841 Body Mass Index (BMI) 40.0 and over, adult: Secondary | ICD-10-CM

## 2018-12-31 DIAGNOSIS — M25562 Pain in left knee: Secondary | ICD-10-CM

## 2018-12-31 NOTE — Progress Notes (Signed)
CC: Both of my knees are hurting. I would like an injection in both knees.  The patient has had chronic pain and tenderness of both knees for some time.  Injections help.  There is no locking or giving way of the knee.  There is no new trauma. There is no redness or signs of infections.  The knees have a mild effusion and some crepitus.  There is no redness or signs of recent trauma.  Right knee ROM is 0-95 and left knee ROM is 0-100.  Impression:  Chronic pain of the both knees  Return:  1 month  PROCEDURE NOTE:  The patient requests injections of both knees, verbal consent was obtained.  The left and right knee were individually prepped appropriately after time out was performed.   Sterile technique was observed and injection of 1 cc of Depo-Medrol 40 mg with several cc's of plain xylocaine. Anesthesia was provided by ethyl chloride and a 20-gauge needle was used to inject each knee area. The injections were tolerated well.  A band aid dressing was applied.  The patient was advised to apply ice later today and tomorrow to the injection sight as needed.  Electronically Signed Sanjuana Kava, MD 5/12/20209:50 AM

## 2019-01-07 DIAGNOSIS — E11622 Type 2 diabetes mellitus with other skin ulcer: Secondary | ICD-10-CM | POA: Diagnosis not present

## 2019-01-14 ENCOUNTER — Telehealth: Payer: Self-pay | Admitting: Orthopaedic Surgery

## 2019-01-14 DIAGNOSIS — M25561 Pain in right knee: Secondary | ICD-10-CM

## 2019-01-14 DIAGNOSIS — G8929 Other chronic pain: Secondary | ICD-10-CM

## 2019-01-14 MED ORDER — OXYCODONE HCL 15 MG PO TABS: ORAL_TABLET | ORAL | 0 refills | Status: DC

## 2019-01-14 NOTE — Telephone Encounter (Signed)
Patient states that there is a death in the family. Patient is aware he is sending his request in early, but was afraid he would forget later on. Stated he knew the pharmacy will not fill it until due date anyway.  Oxycodone 15 MG immediate release  Qty  160 Tablets  [facility] [place]

## 2019-01-30 ENCOUNTER — Ambulatory Visit: Payer: Medicare HMO | Admitting: Orthopaedic Surgery

## 2019-01-30 ENCOUNTER — Other Ambulatory Visit: Payer: Self-pay

## 2019-01-30 ENCOUNTER — Encounter: Payer: Self-pay | Admitting: Orthopaedic Surgery

## 2019-01-30 VITALS — Temp 99.5°F

## 2019-01-30 DIAGNOSIS — M25562 Pain in left knee: Secondary | ICD-10-CM

## 2019-01-30 DIAGNOSIS — M25561 Pain in right knee: Secondary | ICD-10-CM | POA: Diagnosis not present

## 2019-01-30 DIAGNOSIS — G8929 Other chronic pain: Secondary | ICD-10-CM

## 2019-01-30 DIAGNOSIS — Z6841 Body Mass Index (BMI) 40.0 and over, adult: Secondary | ICD-10-CM

## 2019-01-30 NOTE — Progress Notes (Signed)
CC: Both of my knees are hurting. I would like an injection in both knees.  The patient has had chronic pain and tenderness of both knees for some time.  Injections help.  There is no locking or giving way of the knee.  There is no new trauma. There is no redness or signs of infections.  The knees have a mild effusion and some crepitus.  There is no redness or signs of recent trauma.  Right knee ROM is 0-90 and left knee ROM is 0-95.  Impression:  Chronic pain of the both knees  Return:  1 month  PROCEDURE NOTE:  The patient requests injections of both knees, verbal consent was obtained.  The left and right knee were individually prepped appropriately after time out was performed.   Sterile technique was observed and injection of 1 cc of Depo-Medrol 40 mg with several cc's of plain xylocaine. Anesthesia was provided by ethyl chloride and a 20-gauge needle was used to inject each knee area. The injections were tolerated well.  A band aid dressing was applied.  The patient was advised to apply ice later today and tomorrow to the injection sight as needed.  Electronically Signed Sanjuana Kava, MD 6/11/202010:20 AM

## 2019-01-31 ENCOUNTER — Encounter (HOSPITAL_BASED_OUTPATIENT_CLINIC_OR_DEPARTMENT_OTHER): Payer: Medicare HMO | Attending: Internal Medicine

## 2019-01-31 DIAGNOSIS — S31103A Unspecified open wound of abdominal wall, right lower quadrant without penetration into peritoneal cavity, initial encounter: Secondary | ICD-10-CM | POA: Insufficient documentation

## 2019-01-31 DIAGNOSIS — Z9981 Dependence on supplemental oxygen: Secondary | ICD-10-CM | POA: Insufficient documentation

## 2019-01-31 DIAGNOSIS — X58XXXA Exposure to other specified factors, initial encounter: Secondary | ICD-10-CM | POA: Insufficient documentation

## 2019-01-31 DIAGNOSIS — E11622 Type 2 diabetes mellitus with other skin ulcer: Secondary | ICD-10-CM | POA: Insufficient documentation

## 2019-01-31 DIAGNOSIS — E1140 Type 2 diabetes mellitus with diabetic neuropathy, unspecified: Secondary | ICD-10-CM | POA: Insufficient documentation

## 2019-01-31 DIAGNOSIS — L97112 Non-pressure chronic ulcer of right thigh with fat layer exposed: Secondary | ICD-10-CM | POA: Insufficient documentation

## 2019-01-31 DIAGNOSIS — I87331 Chronic venous hypertension (idiopathic) with ulcer and inflammation of right lower extremity: Secondary | ICD-10-CM | POA: Diagnosis not present

## 2019-01-31 DIAGNOSIS — E662 Morbid (severe) obesity with alveolar hypoventilation: Secondary | ICD-10-CM | POA: Insufficient documentation

## 2019-01-31 DIAGNOSIS — Z6841 Body Mass Index (BMI) 40.0 and over, adult: Secondary | ICD-10-CM | POA: Insufficient documentation

## 2019-01-31 DIAGNOSIS — I110 Hypertensive heart disease with heart failure: Secondary | ICD-10-CM | POA: Diagnosis not present

## 2019-01-31 DIAGNOSIS — I890 Lymphedema, not elsewhere classified: Secondary | ICD-10-CM | POA: Diagnosis not present

## 2019-01-31 DIAGNOSIS — G4730 Sleep apnea, unspecified: Secondary | ICD-10-CM | POA: Diagnosis not present

## 2019-01-31 DIAGNOSIS — L97212 Non-pressure chronic ulcer of right calf with fat layer exposed: Secondary | ICD-10-CM | POA: Diagnosis not present

## 2019-01-31 DIAGNOSIS — I509 Heart failure, unspecified: Secondary | ICD-10-CM | POA: Insufficient documentation

## 2019-02-14 ENCOUNTER — Telehealth: Payer: Self-pay

## 2019-02-14 DIAGNOSIS — G8929 Other chronic pain: Secondary | ICD-10-CM

## 2019-02-14 DIAGNOSIS — M25562 Pain in left knee: Secondary | ICD-10-CM

## 2019-02-14 NOTE — Telephone Encounter (Signed)
Oxycodone-15 MG Immediate release tablet  Qty 160 Tablets  [facility] [place]

## 2019-02-17 MED ORDER — OXYCODONE HCL 15 MG PO TABS: ORAL_TABLET | ORAL | 0 refills | Status: DC

## 2019-02-27 ENCOUNTER — Other Ambulatory Visit: Payer: Self-pay

## 2019-02-27 ENCOUNTER — Encounter: Payer: Self-pay | Admitting: Orthopaedic Surgery

## 2019-02-27 ENCOUNTER — Ambulatory Visit (INDEPENDENT_AMBULATORY_CARE_PROVIDER_SITE_OTHER): Payer: Medicare HMO | Admitting: Orthopaedic Surgery

## 2019-02-27 DIAGNOSIS — G8929 Other chronic pain: Secondary | ICD-10-CM

## 2019-02-27 DIAGNOSIS — M25562 Pain in left knee: Secondary | ICD-10-CM | POA: Diagnosis not present

## 2019-02-27 DIAGNOSIS — M25561 Pain in right knee: Secondary | ICD-10-CM | POA: Diagnosis not present

## 2019-02-27 DIAGNOSIS — Z6841 Body Mass Index (BMI) 40.0 and over, adult: Secondary | ICD-10-CM

## 2019-02-27 NOTE — Progress Notes (Signed)
PROCEDURE NOTE:  The patient requests injections of the left knee , verbal consent was obtained.  The left knee was prepped appropriately after time out was performed.   Sterile technique was observed and injection of 1 cc of Depo-Medrol 40 mg with several cc's of plain xylocaine. Anesthesia was provided by ethyl chloride and a 20-gauge needle was used to inject the knee area. The injection was tolerated well.  A band aid dressing was applied.  The patient was advised to apply ice later today and tomorrow to the injection sight as needed.  PROCEDURE NOTE:  The patient requests injections of the right knee , verbal consent was obtained.  The right knee was prepped appropriately after time out was performed.   Sterile technique was observed and injection of 1 cc of Depo-Medrol 40 mg with several cc's of plain xylocaine. Anesthesia was provided by ethyl chloride and a 20-gauge needle was used to inject the knee area. The injection was tolerated well.  A band aid dressing was applied.  The patient was advised to apply ice later today and tomorrow to the injection sight as needed.  See in one month.  Electronically Signed Sanjuana Kava, MD 7/9/202010:19 AM

## 2019-02-28 ENCOUNTER — Encounter (HOSPITAL_BASED_OUTPATIENT_CLINIC_OR_DEPARTMENT_OTHER): Payer: Medicare HMO

## 2019-03-18 ENCOUNTER — Other Ambulatory Visit: Payer: Self-pay | Admitting: Radiology

## 2019-03-18 DIAGNOSIS — M25562 Pain in left knee: Secondary | ICD-10-CM

## 2019-03-18 DIAGNOSIS — G8929 Other chronic pain: Secondary | ICD-10-CM

## 2019-03-18 MED ORDER — OXYCODONE HCL 15 MG PO TABS: ORAL_TABLET | ORAL | 0 refills | Status: DC

## 2019-03-18 NOTE — Telephone Encounter (Signed)
Done earlier today

## 2019-03-18 NOTE — Telephone Encounter (Signed)
Patient called, requested refill Oxycodone 15mg .   [facility]

## 2019-03-27 ENCOUNTER — Ambulatory Visit (INDEPENDENT_AMBULATORY_CARE_PROVIDER_SITE_OTHER): Payer: Medicare HMO | Admitting: Orthopaedic Surgery

## 2019-03-27 ENCOUNTER — Other Ambulatory Visit: Payer: Self-pay

## 2019-03-27 ENCOUNTER — Encounter: Payer: Self-pay | Admitting: Orthopaedic Surgery

## 2019-03-27 DIAGNOSIS — M25561 Pain in right knee: Secondary | ICD-10-CM

## 2019-03-27 DIAGNOSIS — G8929 Other chronic pain: Secondary | ICD-10-CM | POA: Diagnosis not present

## 2019-03-27 DIAGNOSIS — M25562 Pain in left knee: Secondary | ICD-10-CM

## 2019-03-27 DIAGNOSIS — Z6841 Body Mass Index (BMI) 40.0 and over, adult: Secondary | ICD-10-CM

## 2019-03-27 NOTE — Progress Notes (Signed)
CC: Both of my knees are hurting. I would like an injection in both knees.  The patient has had chronic pain and tenderness of both knees for some time.  Injections help.  There is no locking or giving way of the knee.  There is no new trauma. There is no redness or signs of infections.  The knees have a mild effusion and some crepitus.  There is no redness or signs of recent trauma.  Right knee ROM is 0-90 and left knee ROM is 0-95.  Impression:  Chronic pain of the both knees  Return:  1 month  PROCEDURE NOTE:  The patient requests injections of both knees, verbal consent was obtained.  The left and right knee were individually prepped appropriately after time out was performed.   Sterile technique was observed and injection of 1 cc of Depo-Medrol 40 mg with several cc's of plain xylocaine. Anesthesia was provided by ethyl chloride and a 20-gauge needle was used to inject each knee area. The injections were tolerated well.  A band aid dressing was applied.  The patient was advised to apply ice later today and tomorrow to the injection sight as needed.  Electronically Signed Sanjuana Kava, MD 8/6/202010:13 AM

## 2019-04-16 ENCOUNTER — Other Ambulatory Visit: Payer: Self-pay | Admitting: Orthopaedic Surgery

## 2019-04-16 DIAGNOSIS — G8929 Other chronic pain: Secondary | ICD-10-CM

## 2019-04-16 MED ORDER — OXYCODONE HCL 15 MG PO TABS: ORAL_TABLET | ORAL | 0 refills | Status: DC

## 2019-04-16 NOTE — Telephone Encounter (Signed)
Patient ([name]) called for refill - aware that Dwayne Aline Brochure is reviewing refill requests while Dwayne Luna Glasgow is out of clinic: oxyCODONE (ROXICODONE) 15 MG immediate release tablet 160 tablet   - [facility] [employer]

## 2019-04-17 ENCOUNTER — Encounter (HOSPITAL_BASED_OUTPATIENT_CLINIC_OR_DEPARTMENT_OTHER): Payer: Medicare HMO | Attending: Internal Medicine

## 2019-04-17 DIAGNOSIS — L97812 Non-pressure chronic ulcer of other part of right lower leg with fat layer exposed: Secondary | ICD-10-CM | POA: Insufficient documentation

## 2019-04-17 DIAGNOSIS — I509 Heart failure, unspecified: Secondary | ICD-10-CM | POA: Diagnosis not present

## 2019-04-17 DIAGNOSIS — E1140 Type 2 diabetes mellitus with diabetic neuropathy, unspecified: Secondary | ICD-10-CM | POA: Insufficient documentation

## 2019-04-17 DIAGNOSIS — S31103A Unspecified open wound of abdominal wall, right lower quadrant without penetration into peritoneal cavity, initial encounter: Secondary | ICD-10-CM | POA: Insufficient documentation

## 2019-04-17 DIAGNOSIS — I110 Hypertensive heart disease with heart failure: Secondary | ICD-10-CM | POA: Diagnosis not present

## 2019-04-17 DIAGNOSIS — E11622 Type 2 diabetes mellitus with other skin ulcer: Secondary | ICD-10-CM | POA: Diagnosis present

## 2019-04-17 DIAGNOSIS — I890 Lymphedema, not elsewhere classified: Secondary | ICD-10-CM | POA: Diagnosis not present

## 2019-04-17 DIAGNOSIS — Z6841 Body Mass Index (BMI) 40.0 and over, adult: Secondary | ICD-10-CM | POA: Insufficient documentation

## 2019-04-17 DIAGNOSIS — G4730 Sleep apnea, unspecified: Secondary | ICD-10-CM | POA: Insufficient documentation

## 2019-04-17 DIAGNOSIS — X58XXXA Exposure to other specified factors, initial encounter: Secondary | ICD-10-CM | POA: Insufficient documentation

## 2019-04-24 ENCOUNTER — Ambulatory Visit (INDEPENDENT_AMBULATORY_CARE_PROVIDER_SITE_OTHER): Payer: Medicare HMO | Admitting: Orthopaedic Surgery

## 2019-04-24 ENCOUNTER — Other Ambulatory Visit: Payer: Self-pay

## 2019-04-24 ENCOUNTER — Encounter: Payer: Self-pay | Admitting: Orthopaedic Surgery

## 2019-04-24 DIAGNOSIS — M25561 Pain in right knee: Secondary | ICD-10-CM

## 2019-04-24 DIAGNOSIS — G8929 Other chronic pain: Secondary | ICD-10-CM | POA: Diagnosis not present

## 2019-04-24 DIAGNOSIS — Z6841 Body Mass Index (BMI) 40.0 and over, adult: Secondary | ICD-10-CM

## 2019-04-24 DIAGNOSIS — M25562 Pain in left knee: Secondary | ICD-10-CM | POA: Diagnosis not present

## 2019-04-24 NOTE — Progress Notes (Signed)
CC: Both of my knees are hurting. I would like an injection in both knees.  The patient has had chronic pain and tenderness of both knees for some time.  Injections help.  There is no locking or giving way of the knee.  There is no new trauma. There is no redness or signs of infections.  The knees have a mild effusion and some crepitus.  There is no redness or signs of recent trauma.  Right knee ROM is 0-95 and left knee ROM is 0-90.  Impression:  Chronic pain of the both knees  Return:  1 month  PROCEDURE NOTE:  The patient requests injections of both knees, verbal consent was obtained.  The left and right knee were individually prepped appropriately after time out was performed.   Sterile technique was observed and injection of 1 cc of Depo-Medrol 40 mg with several cc's of plain xylocaine. Anesthesia was provided by ethyl chloride and a 20-gauge needle was used to inject each knee area. The injections were tolerated well.  A band aid dressing was applied.  The patient was advised to apply ice later today and tomorrow to the injection sight as needed.   Electronically Signed Sanjuana Kava, MD 9/3/202010:57 AM

## 2019-05-13 ENCOUNTER — Telehealth: Payer: Self-pay | Admitting: Orthopaedic Surgery

## 2019-05-13 DIAGNOSIS — M25562 Pain in left knee: Secondary | ICD-10-CM

## 2019-05-13 DIAGNOSIS — M25561 Pain in right knee: Secondary | ICD-10-CM

## 2019-05-13 DIAGNOSIS — G8929 Other chronic pain: Secondary | ICD-10-CM

## 2019-05-13 MED ORDER — OXYCODONE HCL 15 MG PO TABS: ORAL_TABLET | ORAL | 0 refills | Status: DC

## 2019-05-13 NOTE — Telephone Encounter (Signed)
Patient called for refill:  oxyCODONE (ROXICODONE) 15 MG immediate release tablet 160 tab  -[facility] [employer]

## 2019-05-22 ENCOUNTER — Encounter (HOSPITAL_BASED_OUTPATIENT_CLINIC_OR_DEPARTMENT_OTHER): Payer: Medicare HMO | Admitting: Internal Medicine

## 2019-05-27 ENCOUNTER — Other Ambulatory Visit: Payer: Self-pay

## 2019-05-27 ENCOUNTER — Ambulatory Visit (INDEPENDENT_AMBULATORY_CARE_PROVIDER_SITE_OTHER): Payer: Medicare HMO | Admitting: Orthopaedic Surgery

## 2019-05-27 ENCOUNTER — Encounter: Payer: Self-pay | Admitting: Orthopaedic Surgery

## 2019-05-27 DIAGNOSIS — M25562 Pain in left knee: Secondary | ICD-10-CM

## 2019-05-27 DIAGNOSIS — M25561 Pain in right knee: Secondary | ICD-10-CM | POA: Diagnosis not present

## 2019-05-27 DIAGNOSIS — Z6841 Body Mass Index (BMI) 40.0 and over, adult: Secondary | ICD-10-CM

## 2019-05-27 DIAGNOSIS — G8929 Other chronic pain: Secondary | ICD-10-CM | POA: Diagnosis not present

## 2019-05-27 NOTE — Progress Notes (Signed)
CC: Both of my knees are hurting. I would like an injection in both knees.  The patient has had chronic pain and tenderness of both knees for some time.  Injections help.  There is no locking or giving way of the knee.  There is no new trauma. There is no redness or signs of infections.  The knees have a mild effusion and some crepitus.  There is no redness or signs of recent trauma.  Right knee ROM is 0-90 and left knee ROM is 0-95.  Impression:  Chronic pain of the both knees  Return:  1 month  PROCEDURE NOTE:  The patient requests injections of both knees, verbal consent was obtained.  The left and right knee were individually prepped appropriately after time out was performed.   Sterile technique was observed and injection of 1 cc of Depo-Medrol 40 mg with several cc's of plain xylocaine. Anesthesia was provided by ethyl chloride and a 20-gauge needle was used to inject each knee area. The injections were tolerated well.  A band aid dressing was applied.  The patient was advised to apply ice later today and tomorrow to the injection sight as needed.   Encounter Diagnoses  Name Primary?  . Chronic pain of right knee Yes  . Chronic pain of left knee   . Morbid obesity due to excess calories ([place])   . Body mass index 70 and over, adult [facility])    Electronically Signed Sanjuana Kava, MD 10/6/202010:14 AM

## 2019-05-29 ENCOUNTER — Encounter (HOSPITAL_BASED_OUTPATIENT_CLINIC_OR_DEPARTMENT_OTHER): Payer: Medicare HMO | Admitting: Internal Medicine

## 2019-06-05 ENCOUNTER — Other Ambulatory Visit: Payer: Self-pay

## 2019-06-05 ENCOUNTER — Encounter (HOSPITAL_BASED_OUTPATIENT_CLINIC_OR_DEPARTMENT_OTHER): Payer: Medicare HMO | Attending: Internal Medicine | Admitting: Internal Medicine

## 2019-06-05 DIAGNOSIS — G4730 Sleep apnea, unspecified: Secondary | ICD-10-CM | POA: Insufficient documentation

## 2019-06-05 DIAGNOSIS — X58XXXA Exposure to other specified factors, initial encounter: Secondary | ICD-10-CM | POA: Insufficient documentation

## 2019-06-05 DIAGNOSIS — L97812 Non-pressure chronic ulcer of other part of right lower leg with fat layer exposed: Secondary | ICD-10-CM | POA: Insufficient documentation

## 2019-06-05 DIAGNOSIS — I110 Hypertensive heart disease with heart failure: Secondary | ICD-10-CM | POA: Diagnosis not present

## 2019-06-05 DIAGNOSIS — E1140 Type 2 diabetes mellitus with diabetic neuropathy, unspecified: Secondary | ICD-10-CM | POA: Insufficient documentation

## 2019-06-05 DIAGNOSIS — L97212 Non-pressure chronic ulcer of right calf with fat layer exposed: Secondary | ICD-10-CM | POA: Diagnosis not present

## 2019-06-05 DIAGNOSIS — I509 Heart failure, unspecified: Secondary | ICD-10-CM | POA: Diagnosis not present

## 2019-06-05 DIAGNOSIS — S31103A Unspecified open wound of abdominal wall, right lower quadrant without penetration into peritoneal cavity, initial encounter: Secondary | ICD-10-CM | POA: Diagnosis not present

## 2019-06-05 DIAGNOSIS — E11622 Type 2 diabetes mellitus with other skin ulcer: Secondary | ICD-10-CM | POA: Diagnosis present

## 2019-06-05 DIAGNOSIS — Z6841 Body Mass Index (BMI) 40.0 and over, adult: Secondary | ICD-10-CM | POA: Insufficient documentation

## 2019-06-06 NOTE — Progress Notes (Signed)
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 perhaps slightly better and the major wounds superiorly and slightly worse in the satellite lesion inferior to the major wound. He also has a second satellite lesion medially.  We have been using [facility] tells me he is being more aggressive offloading the wounds on the right pannus both in the lower abdomen and anterior thigh seen silver alginate here 08/24/17; lateral right leg wound looks about the same a bit disappointing given the aggressive debridement I did 2 weeks ago. The satellite lesion underneath this is worse and requires debridement ooHe has the usual wounds on the inferior part of his pannus and the underlying thigh however deep in the recess there is new open areas here o3 measured as a small cluster 09/07/17; lateral right leg wound had some necrotic debris in the mid aspect. Our intake nurse noted what appears to be striated muscle although I am not exactly sure of this after this visit. It did not move with flexion and extension. I been assuming that this is a a venous ulcer associated with venous inflammation and secondary lymphedema. He also has an enlarging wound which was the satellite lesion underneath this. Finally he has the area under his abdominal pannus and the area of his thigh under the abdominal pannus on the right. We have been using silver alginate 09/28/17; the lateral right leg bottle shaped wound is about the same. What started as a small satellite lesion below this is rapidly expanded and I biopsied this. I had previously biopsied the large wound itself in late 2017. His pannus wounds look better. We have use silver alginate for a long period 10/12/17; the lateral right leg bottle-shaped wounds superiorly is about the samestill adherent and slough covered.. The satellite area inferiorly has copious amounts of necrotic material. Culture I did last time showed Pseudomonassensitive to ciprofloxacin and he is finishing this now. Tolerating it well. Biopsy I did did not show any malignancy or other abnormalities. Suggestive of a chronic venous ulcer 10/25/17; the lateral right leg bottle shaped wound continues to be about the  same. At the bottom this is beginning to cold less with what was a satellite lesion underneath it I suspect both of these will merge at some point. He has what appears to be protruding muscle through the middle part of the wound. I've been attempting to put enough compression on this area to counteract that. He has lymphedema and I wonder about compression pumps. He has [employer] 11/22/17; chronic venous insufficiency with inflammation  and lymphedema. The area on the right calf is original wound is may be slightly smaller however the wounds have now continued down his leg and now has a new one on the lateral malleolus and 2 new areas on the anterior calf. Looking through his records I don't see arterial insufficiency although his peripheral pulses are palpable. He also has wounds under the abdominal pannus and on the front of his right thigh. We are using silver alginate absorbent dressings in this area 12/20/17; chronic venous insufficiency with inflammation and lymphedema. The patient has 3 separate wounds on the right lateral calf. This goes from just below his knee down to the right lateral malleolus. He has a new wound medially on the right calf. Several excoriated areas anteriorly. We have been using silver alginate, the wound area makes any other type of dressing difficult simply because of the volume of open areas. His nephew changes his wrap and per our intake nurse he does a good job. He does not have compression pumps. 01/10/18; chronic venous insufficiency with inflammation and lymphedema. He has wounds right along the right lateral calf. Also wounds on the upper abdomen under the pannus and the anterior right thigh. We've been using Fibracol. He has had some improvement in the superior part of the leg wound. He uses 4 layer compression that his nephew puts on him and he apparently doesn't good job. He received a phone call to say that his insurance would not cover compression  pumps 02/07/18; chronic venous insufficiency with inflammation and lymphedema. He is not eligible for compression pumps through his insurance. We are using 4 layer compression. He has had some improvement in the inferior part of the large wound on his left lateral leg but some deterioration in the superior aspect. There was some drainage coming out of this. I didn't see any evidence of infection 03/07/18; chronic venous insufficiency with inflammation and lymphedema. We now think there may be an avenue to get compression pumps covered through his insurance. We are using 4 layer compression. We are using Furber call to the large wound area which I think are looking some better in the dimensions are better. Some of the smaller wounds on the anterior right leg healed. The area between the large abdominal pannus and the anterior right thigh is worse. The wound on the thigh is worse. There is surrounding fungal dermatitis which is probably Candida intertrigo. The areas reasonably substantial 04/18/18; this patient I have not seen in about a month and a half. He tells me since he is been here he had fairly extensive cellulitis involving his lower abdominal pannus and anterior right thigh. Wounds in this area are therefore today quite a bit worse. He also has had worsening of the large wounds along his lateral right calf which as I remember things were quite a bit better 6 weeks ago. He does have his lymphedema pumps apparently on approval status but still not delivered to his home. We have been using Fibracol. Limited amount of dressings we can use secondary to the substantial overall wound area 05/16/2018; monthly follow-up. The patient has obtained his external compression pumps and use them 4 times. There is a remarkable improvement in the amount of edema in the right lower leg and some improvement in the generalized erythema especially on the right lateral calf. However he continues to have a  substantial wound area basically from just around the lateral malleolus up to his fibular head. He seems to tolerate this remarkably  well. We have been using purachol 06/13/2018 monthly follow-up. Patient states he uses his compression pumps once a day sometimes twice. He has a lot of improvement in the edema but still a colossal full-thickness wound on the right lateral calf all the way from the ankle to just below the knee. ooShe also has a new area medially in the lower abdominal pannus ooWe have been using silver alginate on the abdominal pannus/thigh wounds and pure call on the right lateral calf. He uses ABD pads to try and push the bulging muscle back into the calf area I certainly encourage this 07/04/2018; patient is using compression pumps at home. A lot of improvement in the edema but still a colossal full-thickness wound on the right lateral calf from the ankle to the knee. This has exposed muscle. ooShe has 3 areas on the pannus of his abdomen to on the abdominal side and one on the right anterior thigh we have been using silver alginate 08/01/2018. Large wound on the right lateral calf and ankle. Things look actually stable here. Surface is stable he has some epithelialization. Edema control is adequate using compression pumps 1-2 times per day ooIn the large abdominal folds he still has the area on the anterior thigh he has 3 wounds on the pannus we are using silver alginate 08/30/2018; the large wound on his right lateral calf and ankle. Miraculously looks some better. He has large rims of skin coming inferiorly distally and more proximally superiorly. This really looks like it is doing well. Macerated wounds on his pannus all about the same. There are 3 wounds 2 on the thigh one on the abdomen. Areas moist. They have been using silver alginate 2/7/; large anterior calf wounds on the right lateral calf and ankle. He has been using Fibracol. Arrives today with a very adherent  necrotic surface that I could not wash off with Anasept. No change in the pannus wounds using silver alginate here 3/6; large wounds on the right lateral calf and ankle. Actually has a better looking surface. Furthermore some epithelialization. This is a very large wound but appears to be making some progress there using silver alginate He has 3 wounds. In the abdominal pannus against his thigh one on the lower abdomen, one right in the inguinal area and one on the anterior thigh. These areas are wet and macerated. Simply too much pressure here 4/13; telehealth visit out of concerns for the Covid epidemic. We did not have clear images. The patient is using silver alginate but he ran out of this and is using Hydrofera Blue on his leg and silver alginate on the wounds on his thigh and pannus area on the right. 5/7; some improvement in the right lateral leg wound using silver alginate however he has 2 large areas on the lower abdominal pannus one on the right anterior thigh that really are larger and remain in constantly warm macerated situation. 6/12; the area on the right lateral leg perhaps somewhat less in terms of width but the entire wound surface is covered with a very adherent debris which I think inhibits any degree of epithelialization. We have been using silver alginate to this large wound area, there is not much else to choose from. We did try Fibracol for a while. This did not really help. ooHe has the areas on the lower abdominal pannus and upper thigh we have simply been using drawtex to this and things look somewhat better today. 7/27; no change in the right lateral  leg wound covered in very adherent surface slough. The areas on the abdomen and pannus and upper thigh on the right actually look better. We have been using silver alginate to all wounds. 8/27; right lateral leg wound is 100% slough covered very adherent. The area on the abdomen and pannus upper thigh all look better.  We have been using just drawtex on these areas. He is using his external compression pumps at home 10/15; right lateral leg wound is 100% slough covered very adherent. We have been using Mehdi honey. This is inexpensive somewhat antibacterial. It does not seem to be making any headway as with the amount of eschar here. I can no longer debride him in this clinic. There is simply too much bleeding. He is not on blood thinners. I suspect this may be severe venous hypertension related to cor pulmonale/obesity hypoventilation syndrome Objective Constitutional Patient is hypertensive.. Pulse regular and within target range for patient.Marland Kitchen Respirations regular, non-labored and within target range.. Temperature is normal and within the target range for the patient.Marland Kitchen Appears in no distress. Vitals Time Taken: 2:58 PM, Height: 71 in, Weight: 528 lbs, BMI: 73.6, Temperature: 98.6 F, Pulse: 87 bpm, Respiratory Rate: 18 breaths/min, Blood Pressure: 144/66 mmHg, Capillary Blood Glucose: 156 mg/dl. General Notes: CBG per patient Eyes Conjunctivae clear. No discharge.no icterus. Respiratory work of breathing is normal 021. Psychiatric appears at normal baseline. General Notes: Wound exam ooExtensive area on the right lateral calf. This is about the same. This is 100% slough covered. Nevertheless it is been about the same for the last year. ooHe has pannus wounds in the right lower abdomen these are a lot better as is the area on the right anterior thigh Integumentary (Hair, Skin) No evidence of cellulitis around the wound. Wound #14 status is Open. Original cause of wound was Gradually Appeared. The wound is located on the Right Abdomen - Lower Quadrant. The wound measures 1cm length x 3cm width x 0.1cm depth; 2.356cm^2 area and 0.236cm^3 volume. There is Fat Layer (Subcutaneous Tissue) Exposed exposed. There is no tunneling or undermining noted. There is a large amount of serosanguineous drainage  noted. The wound margin is epibole. There is large (67-100%) pink, pale granulation within the wound bed. There is no necrotic tissue within the wound bed. Wound #28 status is Open. Original cause of wound was Gradually Appeared. The wound is located on the Right Upper Leg. The wound measures 6cm length x 5cm width x 0.1cm depth; 23.562cm^2 area and 2.356cm^3 volume. There is Fat Layer (Subcutaneous Tissue) Exposed exposed. There is no tunneling or undermining noted. There is a large amount of serosanguineous drainage noted. The wound margin is flat and intact. There is large (67-100%) red granulation within the wound bed. There is no necrotic tissue within the wound bed. Wound #30 status is Open. Original cause of wound was Gradually Appeared. The wound is located on the Right,Medial Abdomen - Lower Quadrant. The wound measures 8cm length x 5cm width x 0.1cm depth; 31.416cm^2 area and 3.142cm^3 volume. There is Fat Layer (Subcutaneous Tissue) Exposed exposed. There is no tunneling or undermining noted. There is a large amount of serosanguineous drainage noted. The wound margin is flat and intact. There is large (67-100%) red granulation within the wound bed. There is no necrotic tissue within the wound bed. Wound #9 status is Open. Original cause of wound was Gradually Appeared. The wound is located on the Right,Lateral Lower Leg. The wound measures 37cm length x 22cm width x 0.3cm depth;  639.314cm^2 area and 191.794cm^3 volume. There is Fat Layer (Subcutaneous Tissue) Exposed exposed. There is no tunneling or undermining noted. There is a large amount of serosanguineous drainage noted. The wound margin is flat and intact. There is small (1-33%) pink granulation within the wound bed. There is a large (67-100%) amount of necrotic tissue within the wound bed including Adherent Slough. 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 There was a Four Layer Compression Therapy Procedure by Levan Hurst, RN. Post procedure Diagnosis Wound #9: Same as Pre-Procedure Plan Follow-up Appointments: Return Appointment in: - 2 months Dressing Change Frequency: Change Dressing every other day. - all wounds Skin Barriers/Peri-Wound Care: Wound #14 Right Abdomen - Lower Quadrant: Antifungal powder - nystatin powder to abdominal skin folds Barrier cream - to periwound. Wound #30 Right,Medial Abdomen - Lower Quadrant: Barrier cream - to periwound. Wound #9 Right,Lateral Lower Leg: Barrier cream Moisturizing lotion Wound Cleansing: Wound #9 Right,Lateral Lower Leg: May shower with protection. - do not get right leg wet Other: - Clean all wounds with gauze and Anasept Primary Wound Dressing: Wound #14 Right Abdomen - Lower Quadrant: Other: - drawtex cut to fit wound bed margins. Wound #28 Right Upper Leg: Other: - drawtex cut to fit wound bed margins. Wound #30 Right,Medial Abdomen - Lower Quadrant: Other: - drawtex cut to fit wound bed margins. Wound #9 Right,Lateral Lower Leg: Medihoney Alginate - sheets apply to wound bed. Secondary Dressing: Wound #14 Right Abdomen - Lower Quadrant: ABD pad Wound #28 Right Upper Leg: ABD pad Wound #30 Right,Medial Abdomen - Lower Quadrant: ABD pad Wound #9 Right,Lateral Lower Leg: ABD pad - Roll and layer ABDs  under compression wrap to apply pressure to muscle. Kerramax - or Zetuvit Edema Control: 4 layer compression - Right Lower Extremity Avoid standing for long periods of time Elevate legs to the level of the heart or above for 30 minutes daily and/or when sitting, a frequency of: - throughout the day Segmental Compressive Device. - Lymphedema pumps twice a day for 1 hour each 1. I continued with the Medihoney to the right lateral calf wound 2. Continue with silver alginate to the wounds in the pannus on the right 3. I will continue to see this man on a palliative basis. There is really nothing I can do for this right lower leg wound in this clinic. I cannot imagine that he could be put under general anesthesia in the condition he is in Electronic Signature(s) Signed: 06/05/2019 5:35:18 PM By: Linton Ham MD Entered By: Linton Ham on 06/05/2019 17:17:38 -------------------------------------------------------------------------------- SuperBill Details Patient Name: Date of Service: [name] 06/05/2019 Medical Record [id number] Patient Account Number: [id number] Date of Birth/Sex: Treating RN: November 27, 1960 (58 y.o. 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 I89.0 Lymphedema, not elsewhere classified E66.01 Morbid (severe) obesity due to excess calories L97.112 Non-pressure chronic ulcer of right  thigh with fat layer exposed Facility Procedures CPT4 Code Description: [id number] (Facility Use Only) [phone] - APPLY MULTLAY COMPRS LWR RT LEG Modifier: 1 Quantity: Physician Procedures CPT4 Code Description: [id number] - WC PHYS LEVEL 2 - EST PT ICD-10 Diagnosis Description L97.213 Non-pressure chronic ulcer of right calf with necrosis o I87.331 Chronic venous hypertension (idiopathic) with ulcer and lower extremity Modifier: f muscle inflammation of Quantity: 1 right Electronic Signature(s) Signed: 06/06/2019 5:33:39 PM By: Linton Ham MD Signed: 06/06/2019 6:00:55 PM By: Levan Hurst RN, BSN Previous Signature: 06/05/2019 5:35:18 PM Version By: Linton Ham MD Entered By: Levan Hurst on 06/05/2019 18:17:34

## 2019-06-09 ENCOUNTER — Telehealth: Payer: Self-pay

## 2019-06-09 DIAGNOSIS — M25561 Pain in right knee: Secondary | ICD-10-CM

## 2019-06-09 DIAGNOSIS — M25562 Pain in left knee: Secondary | ICD-10-CM

## 2019-06-09 DIAGNOSIS — G8929 Other chronic pain: Secondary | ICD-10-CM

## 2019-06-09 NOTE — Telephone Encounter (Signed)
Oxycodone-15 mg immediate release tablet  Qty 160 Tablets  States he knows that they will hold it until its due  PATIENT USES [facility]

## 2019-06-10 MED ORDER — OXYCODONE HCL 15 MG PO TABS: ORAL_TABLET | ORAL | 0 refills | Status: DC

## 2019-06-24 ENCOUNTER — Other Ambulatory Visit: Payer: Self-pay

## 2019-06-24 ENCOUNTER — Ambulatory Visit: Payer: Medicare HMO | Admitting: Orthopaedic Surgery

## 2019-06-24 ENCOUNTER — Encounter: Payer: Self-pay | Admitting: Orthopaedic Surgery

## 2019-06-24 DIAGNOSIS — M25561 Pain in right knee: Secondary | ICD-10-CM

## 2019-06-24 DIAGNOSIS — M25562 Pain in left knee: Secondary | ICD-10-CM

## 2019-06-24 DIAGNOSIS — G8929 Other chronic pain: Secondary | ICD-10-CM | POA: Diagnosis not present

## 2019-06-24 DIAGNOSIS — Z6841 Body Mass Index (BMI) 40.0 and over, adult: Secondary | ICD-10-CM

## 2019-06-24 NOTE — Progress Notes (Signed)
PROCEDURE NOTE:  The patient requests injections of the left knee , verbal consent was obtained.  The left knee was prepped appropriately after time out was performed.   Sterile technique was observed and injection of 1 cc of Depo-Medrol 40 mg with several cc's of plain xylocaine. Anesthesia was provided by ethyl chloride and a 20-gauge needle was used to inject the knee area. The injection was tolerated well.  A band aid dressing was applied.  The patient was advised to apply ice later today and tomorrow to the injection sight as needed.  PROCEDURE NOTE:  The patient requests injections of the right knee , verbal consent was obtained.  The right knee was prepped appropriately after time out was performed.   Sterile technique was observed and injection of 1 cc of Depo-Medrol 40 mg with several cc's of plain xylocaine. Anesthesia was provided by ethyl chloride and a 20-gauge needle was used to inject the knee area. The injection was tolerated well.  A band aid dressing was applied.  The patient was advised to apply ice later today and tomorrow to the injection sight as needed.  Electronically Signed Sanjuana Kava, MD 11/3/202010:09 AM

## 2019-06-26 ENCOUNTER — Ambulatory Visit: Payer: Medicare HMO | Admitting: Orthopaedic Surgery

## 2019-07-14 ENCOUNTER — Telehealth: Payer: Self-pay

## 2019-07-14 DIAGNOSIS — M25562 Pain in left knee: Secondary | ICD-10-CM

## 2019-07-14 DIAGNOSIS — G8929 Other chronic pain: Secondary | ICD-10-CM

## 2019-07-14 DIAGNOSIS — M25561 Pain in right knee: Secondary | ICD-10-CM

## 2019-07-14 MED ORDER — OXYCODONE HCL 15 MG PO TABS: ORAL_TABLET | ORAL | 0 refills | Status: DC

## 2019-07-14 NOTE — Telephone Encounter (Signed)
Oxycodone 15 mg immediate release Qty 160 Tablets  PATIENT USES [facility]

## 2019-07-15 ENCOUNTER — Telehealth: Payer: Self-pay | Admitting: Orthopaedic Surgery

## 2019-07-15 DIAGNOSIS — G8929 Other chronic pain: Secondary | ICD-10-CM

## 2019-07-15 DIAGNOSIS — M25562 Pain in left knee: Secondary | ICD-10-CM

## 2019-07-15 NOTE — Telephone Encounter (Signed)
Done

## 2019-07-15 NOTE — Telephone Encounter (Signed)
Rod called and asked if you would call his pharmacy[facility] in [place].  He said he could not get new prescription filled until Thanksgiving Day and that he needed to get the prescription today.  Prescription states 30 days and it is now 28 days.  I told him that I would have to send you a message.  Please call [facility] for this patient and advise the pharmacy if will approve for the prescription to be filled 2 days early.    Thanks

## 2019-07-15 NOTE — Telephone Encounter (Signed)
Rod called and asked if you would call his pharmacy

## 2019-07-22 ENCOUNTER — Ambulatory Visit: Payer: Medicare HMO | Admitting: Orthopaedic Surgery

## 2019-07-22 ENCOUNTER — Other Ambulatory Visit: Payer: Self-pay

## 2019-07-22 ENCOUNTER — Encounter: Payer: Self-pay | Admitting: Orthopaedic Surgery

## 2019-07-22 DIAGNOSIS — M25561 Pain in right knee: Secondary | ICD-10-CM | POA: Diagnosis not present

## 2019-07-22 DIAGNOSIS — M25562 Pain in left knee: Secondary | ICD-10-CM | POA: Diagnosis not present

## 2019-07-22 DIAGNOSIS — Z6841 Body Mass Index (BMI) 40.0 and over, adult: Secondary | ICD-10-CM

## 2019-07-22 DIAGNOSIS — G8929 Other chronic pain: Secondary | ICD-10-CM | POA: Diagnosis not present

## 2019-07-22 NOTE — Progress Notes (Signed)
CC: Both of my knees are hurting. I would like an injection in both knees.  The patient has had chronic pain and tenderness of both knees for some time.  Injections help.  There is no locking or giving way of the knee.  There is no new trauma. There is no redness or signs of infections.  The knees have a mild effusion and some crepitus.  There is no redness or signs of recent trauma.  Right knee ROM is 0-90 and left knee ROM is 0-95.  Impression:  Chronic pain of the both knees  Return:  1 month  PROCEDURE NOTE:  The patient requests injections of both knees, verbal consent was obtained.  The left and right knee were individually prepped appropriately after time out was performed.   Sterile technique was observed and injection of 1 cc of Depo-Medrol 40 mg with several cc's of plain xylocaine. Anesthesia was provided by ethyl chloride and a 20-gauge needle was used to inject each knee area. The injections were tolerated well.  A band aid dressing was applied.  The patient was advised to apply ice later today and tomorrow to the injection sight as needed.   Electronically [place], MD 12/1/20209:16 AM

## 2019-07-29 NOTE — Progress Notes (Signed)
UYLESS, SAFRON ([id number]) Visit Report for 06/05/2019 Arrival Information Details Patient Name: Date of Service: [name], [name] 06/05/2019 2:30 PM Medical Record [id number] Patient Account Number: [id number] Date of Birth/Sex: Treating RN: [birth year] (58 y.o. [name]) Carlene Coria Primary Care Davante Gerke: Margarita Rana Other Clinician: Referring Arnetha Silverthorne: Treating Katelyne Galster/Extender:Robson, Stefan Church, Elon Alas in Treatment: 92 Visit Information History Since Last Visit All ordered tests and consults were completed: No Patient Arrived: [name] Arrival Time: 14:56 Any new allergies or adverse reactions: No Accompanied By: newphie Had a fall or experienced change in No Transfer Assistance: None activities of daily living that may affect Patient Identification Verified: Yes risk of falls: Secondary Verification Process Completed: Yes Signs or symptoms of abuse/neglect since last No Patient Requires Transmission-Based No visito Precautions: Hospitalized since last visit: No Patient Has Alerts: Yes Implantable device outside of the clinic excluding No Patient Alerts: R ABI = cellular tissue based products placed in the center 1.15 since last visit: Has Dressing in Place as Prescribed: Yes Pain Present Now: Yes Electronic Signature(s) Signed: 07/29/2019 3:01:15 PM By: Carlene Coria RN Entered By: Carlene Coria on 06/05/2019 14:57:58 -------------------------------------------------------------------------------- Compression Therapy Details Patient Name: Date of Service: [name], [name] 06/05/2019 2:30 PM Medical Record [id number] Patient Account Number: [id number] Date of Birth/Sex: Treating RN: [birth year] (58 y.o. [name] Primary Care Lakeysha Slutsky: Margarita Rana Other Clinician: Referring Khelani Kops: Treating Avyan Livesay/Extender:Robson, Stefan Church, Elon Alas in Treatment: 166 Compression  Therapy Performed for Wound Wound #9 Right,Lateral Lower Leg Assessment: Performed By: Clinician Levan Hurst, RN Compression Type: Four Layer Post Procedure Diagnosis Same as Pre-procedure Electronic Signature(s) Signed: 06/06/2019 6:00:55 PM By: Levan Hurst RN, BSN Entered By: Levan Hurst on 06/05/2019 16:01:11 -------------------------------------------------------------------------------- Encounter Discharge Information Details Patient Name: Date of Service: [name] 06/05/2019 2:30 PM Medical Record [id number] Patient Account Number: [id number] Date of Birth/Sex: Treating RN: Apr 01, 1961 (58 y.o. [name] Primary Care Laasia Arcos: Margarita Rana Other Clinician: Referring Natsha Guidry: Treating Nainika Newlun/Extender:Robson, Stefan Church, Elon Alas in Treatment: 166 Encounter Discharge Information Items Discharge Condition: Stable Ambulatory Status: Wheelchair Discharge Destination: Home Transportation: Private Auto Accompanied By: son Schedule Follow-up Appointment: Yes Clinical Summary of Care: Patient Declined Electronic Signature(s) Signed: 06/05/2019 6:42:53 PM By: Kela Millin Entered By: Kela Millin on 06/05/2019 16:45:00 -------------------------------------------------------------------------------- Lower Extremity Assessment Details Patient Name: Date of Service: [name], [name] 06/05/2019 2:30 PM Medical Record [id number] Patient Account Number: [id number] Date of Birth/Sex: Treating RN: 11/01/1960 (58 y.o. [name] Primary Care Tameko Halder: Margarita Rana Other Clinician: Referring Tinnie Kunin: Treating Makenli Derstine/Extender:Robson, Stefan Church, Marlow Baars Weeks in Treatment: 166 Edema Assessment Assessed: [Left: No] [Right: No] Edema: [Left: Ye] [Right: s] Calf Left: Right: Point of Measurement: 32 cm From Medial Instep cm 47 cm Ankle Left: Right: Point of Measurement: 10 cm From Medial Instep  cm 30 cm Electronic Signature(s) Signed: 07/29/2019 3:01:15 PM By: Carlene Coria RN Entered By: Carlene Coria on 06/05/2019 15:09:16 -------------------------------------------------------------------------------- Multi Wound Chart Details Patient Name: Date of Service: [name] 06/05/2019 2:30 PM Medical Record [id number] Patient Account Number: [id number] Date of Birth/Sex: Treating RN: Feb 07, 1961 (58 y.o. [name] Primary Care Nakiea Metzner: Margarita Rana Other Clinician: Referring Kinlee Garrison: Treating Savanah Bayles/Extender:Robson, Stefan Church, Elon Alas in Treatment: 166 Vital Signs Height(in): 71 Capillary Blood 156 Glucose(mg/dl): Weight(lbs): 528 Pulse(bpm): 87 Body Mass Index(BMI): 72 Blood Pressure(mmHg): 144/66 Temperature(F): 98.6 Respiratory 18 Rate(breaths/min): Photos: [14:No Photos] [28:No Photos] [30:No Photos] Wound Location: [14:Right  Abdomen - Lower Quadrant] [28:Right Upper Leg] [30:Right Abdomen - Lower Quadrant - Medial] Wounding Event: [14:Gradually Appeared] [28:Gradually Appeared] [30:Gradually Appeared] Primary Etiology: [14:Atypical] [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] [30:Associated Skin Damage] Comorbid History: [14:Anemia, Lymphedema, Sleep Apnea, Arrhythmia, Sleep Apnea, Arrhythmia, Sleep Apnea, Arrhythmia, Congestive Heart Failure, Congestive Heart Failure, Congestive Heart Failure, Hypertension, Peripheral Hypertension, Peripheral  Hypertension, Peripheral Venous Disease, Type II Venous Disease, Type II Venous Disease, Type II Diabetes, Gout, Osteoarthritis, Neuropathy, Osteoarthritis, Neuropathy, Osteoarthritis, Neuropathy, Confinement Anxiety] [28:Anemia, Lymphedema, Diabetes,  Gout, Confinement Anxiety] [30:Anemia, Lymphedema, Diabetes, Gout, Confinement Anxiety] Date Acquired: [14:04/23/2016] [28:02/07/2018] [30:06/13/2018] Weeks of Treatment: [14:161] [28:69] [30:51] Wound Status: [14:Open]  [28:Open] [30:Open] Clustered Wound: [14:Yes] [28:No] [30:No] Clustered Quantity: [14:1] [28:N/A] [30:N/A] Measurements L x W x D 1x3x0.1 [28:6x5x0.1] [30:8x5x0.1] (cm) Area (cm) : [14:2.356] [28:23.562] [30:31.416] Volume (cm) : [14:0.236] [28:2.356] [30:3.142] % Reduction in Area: [14:44.40%] [28:-404.20%] [30:-2186.50%] % Reduction in Volume: [14:44.30%] [28:-404.50%] [30:-2193.40%] Classification: [14:Full Thickness Without Exposed Support Structures] [28:Grade 1] [30:N/A] Exudate Amount: [14:Large] [28:Large] [30:Large] Exudate Type: [14:Serosanguineous] [28:Serosanguineous] [30:Serosanguineous] Exudate Color: [14:red, brown] [28:red, brown] [30:red, brown] Wound Margin: [14:Epibole] [28:Flat and Intact] [30:Flat and Intact] Granulation Amount: [14:Large (67-100%)] [28:Large (67-100%)] [30:Large (67-100%)] Granulation Quality: [14:Pink, Pale] [28:Red] [30:Red] Necrotic Amount: [14:None Present (0%)] [28:None Present (0%)] [30:None Present (0%)] Exposed Structures: [14:Fat Layer (Subcutaneous Fat Layer (Subcutaneous Tissue) Exposed: Yes Fascia: No Tendon: No Muscle: No Joint: No Bone: No] [28:Tissue) Exposed: Yes Fascia: No Tendon: No Muscle: No Joint: No Bone: No] [30:Fat Layer (Subcutaneous Tissue) Exposed: Yes  Fascia: No Tendon: No Muscle: No Joint: No Bone: No] Epithelialization: [14:Small (1-33%)] [28:Small (1-33%)] [30:Small (1-33%)] Procedures Performed: [14:N/A 9] [28:N/A N/A] [30:N/A N/A] Photos: [14:No Photos] [28:N/A] [30:N/A] Wound Location: [14:Right Lower Leg - Lateral N/A] [30:N/A] Wounding Event: [14:Gradually Appeared] [28:N/A] [30:N/A] 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] [30:N/A] Comorbid History: [14:Anemia, Lymphedema, Sleep Apnea, Arrhythmia, Congestive Heart Failure, Hypertension, Peripheral Venous Disease, Type II Diabetes, Gout, Osteoarthritis, Neuropathy, Confinement Anxiety] [28:N/A] [30:N/A] Date Acquired: [14:03/10/2016]  [28:N/A] [30:N/A] Weeks of Treatment: [14:166] [28:N/A] [30:N/A] Wound Status: [14:Open] [28:N/A] [30:N/A] Clustered Wound: [14:Yes] [28:N/A] [30:N/A] Clustered Quantity: [14:2] [28:N/A] [30:N/A] Measurements L x W x D 37x22x0.3 [28:N/A] [30:N/A] (cm) Area (cm) : [id number] [28:N/A] [30:N/A] Volume (cm) : [14:191.794] [28:N/A] [30:N/A] % Reduction in Area: [14:-41630.70%] [28:N/A] [30:N/A] % Reduction in Volume: -125255.60% [28:N/A] [30:N/A] Classification: [14:Grade 2] [28:N/A] [30:N/A] Exudate Amount: [14:Large] [28:N/A] [30:N/A] Exudate Type: [14:Serosanguineous] [28:N/A] [30:N/A] Exudate Color: [14:red, brown] [28:N/A] [30:N/A] Wound Margin: [14:Flat and Intact] [28:N/A] [30:N/A] Granulation Amount: [14:Small (1-33%)] [28:N/A] [30:N/A] Granulation Quality: [14:Pink] [28:N/A] [30:N/A] Necrotic Amount: [14:Large (67-100%)] [28:N/A] [30:N/A] Exposed Structures: [14:Fat Layer (Subcutaneous N/A Tissue) Exposed: Yes Fascia: No Tendon: No Muscle: No Joint: No Bone: No] [30:N/A] Epithelialization: [14:Small (1-33%) Compression Therapy] [28:N/A N/A N/A N/A] Treatment Notes Wound #14 (Right Abdomen - Lower Quadrant) 1. Cleanse With Wound Cleanser Soap and water 2. Periwound Care Antifungal powder Barrier cream 4. Secondary Dressing ABD Pad Drawtex 5. Secured With Tape Wound #28 (Right Upper Leg) 1. Cleanse With Wound Cleanser Soap and water 2. Periwound Care Antifungal powder Barrier cream 4. Secondary Dressing ABD Pad Drawtex 5. Secured With Tape Wound #30 (Right, Medial Abdomen - Lower Quadrant) 1. Cleanse With Wound Cleanser Soap and water 2. Periwound Care Antifungal powder Barrier cream 4. Secondary Dressing ABD Pad Drawtex 5. Secured With Tape Wound #9 (Right, Lateral Lower Leg) 1. Cleanse With Wound Cleanser Soap and water 2. Periwound Care Barrier cream 3. Primary  Dressing Applied Other primary dressing (specifiy in notes) 4. Secondary  Dressing ABD Pad Kerramax/Xtrasorb 6. Support Layer Applied 4 layer compression wrap Notes medihoney and calcium alginate Electronic Signature(s) Signed: 06/05/2019 5:35:18 PM By: Linton Ham MD Signed: 06/06/2019 6:00:55 PM By: Levan Hurst RN, BSN Entered By: Linton Ham on 06/05/2019 17:11:25 -------------------------------------------------------------------------------- [place] Details Patient Name: Date of Service: [name], [name] 06/05/2019 2:30 PM Medical Record [id number] Patient Account Number: [id number] Date of Birth/Sex: Treating RN: 08/06/1961 (58 y.o. [name] Primary Care Nicholette Dolson: Margarita Rana Other Clinician: Referring Milad Bublitz: Treating Merlen Gurry/Extender:Robson, Stefan Church, Elon Alas in Treatment: 166 Active Inactive Wound/Skin Impairment Nursing Diagnoses: Impaired tissue integrity Knowledge deficit related to ulceration/compromised skin integrity Goals: Patient/caregiver will verbalize understanding of skin care regimen Date Initiated: 03/24/2016 Target Resolution Date: 07/22/2019 Goal Status: Active Ulcer/skin breakdown will have a volume reduction of 30% by week 4 Date Initiated: 03/24/2016 Date Inactivated: 06/01/2017 Target Resolution Date: 10/18/2016 Unmet Reason: chonic, Goal Status: Unmet uncontrolled edema, morbid obesity Interventions: Assess patient/caregiver ability to obtain necessary supplies Assess patient/caregiver ability to perform ulcer/skin care regimen upon admission and as needed Assess ulceration(s) every visit Provide education on ulcer and skin care Notes: Electronic Signature(s) Signed: 06/06/2019 6:00:55 PM By: Levan Hurst RN, BSN Entered By: Levan Hurst on 06/05/2019 18:16:59 -------------------------------------------------------------------------------- Pain Assessment Details Patient Name: Date of Service: [name] 06/05/2019 2:30  PM Medical Record [id number] Patient Account Number: [id number] Date of Birth/Sex: Treating RN: [birth year] (58 y.o. [name] Primary Care Aaron Boeh: Margarita Rana Other Clinician: Referring Marasia Newhall: Treating Kenyonna Micek/Extender:Robson, Stefan Church, Elon Alas in Treatment: 166 Active Problems Location of Pain Severity and Description of Pain Patient Has Paino Yes Site Locations With Dressing Change: Yes Duration of the Pain. Constant / Intermittento Constant Rate the pain. Current Pain Level: 5 Worst Pain Level: 5 Least Pain Level: 5 Tolerable Pain Level: 5 Character of Pain Describe the Pain: Aching Pain Management and Medication Current Pain Management: Medication: Yes Cold Application: No Rest: Yes Massage: No Activity: No T.E.N.S.: No Heat Application: No Leg drop or elevation: No Is the Current Pain Management Adequate: Inadequate How does your wound impact your activities of daily livingo Sleep: No Bathing: No Appetite: No Relationship With Others: No Bladder Continence: No Emotions: No Bowel Continence: No Work: No Toileting: No Drive: No Dressing: No Hobbies: No Electronic Signature(s) Signed: 07/29/2019 3:01:15 PM By: Carlene Coria RN Entered By: Carlene Coria on 06/05/2019 14:59:12 -------------------------------------------------------------------------------- Patient/Caregiver Education Details Patient Name: Date of Service: [name] 10/15/2020andnbsp2:30 PM Medical Record [id number] Patient Account Number: [id number] Date of Birth/Gender: Treating RN: 06/28/1961 (58 y.o. [name] Primary Care Physician: Margarita Rana Other Clinician: Referring Physician: Treating Physician/Extender:Robson, Stefan Church, Elon Alas in Treatment: 56 Education Assessment Education Provided To: Patient Education Topics Provided Wound/Skin Impairment: Methods: Explain/Verbal Responses: State content  correctly Electronic Signature(s) Signed: 06/06/2019 6:00:55 PM By: Levan Hurst RN, BSN Entered By: Levan Hurst on 06/05/2019 18:17:11 -------------------------------------------------------------------------------- Wound Assessment Details Patient Name: Date of Service: [name] 06/05/2019 2:30 PM Medical Record [id number] Patient Account Number: [id number] Date of Birth/Sex: Treating RN: Nov 26, 1960 (58 y.o. [name] Primary Care Deaira Leckey: Margarita Rana Other Clinician: Referring Geneal Huebert: Treating Vernella Niznik/Extender:Robson, Stefan Church, Elon Alas in Treatment: 166 Wound Status Wound Number: 14 Primary Atypical Etiology: Wound Location: Right Abdomen - Lower Quadrant Wound Open Wounding Event: Gradually Appeared Status: Date Acquired: 04/23/2016 Comorbid Anemia, Lymphedema, Sleep Apnea, Weeks Of Treatment: 161 History:  Arrhythmia, Congestive Heart Failure, Clustered Wound: Yes Hypertension, Peripheral Venous Disease, Type II Diabetes, Gout, Osteoarthritis, Neuropathy, Confinement Anxiety Photos Wound Measurements Length: (cm) 1 % Reduct Width: (cm) 3 % Reduct Depth: (cm) 0.1 Epitheli Clustered Quantity: 1 Tunnelin Area: (cm) 2.356 Undermi Volume: (cm) 0.236 Wound Description Classification: Full Thickness Without Exposed Support Foul Odo Structures Slough/F Wound Epibole Margin: Exudate Large Amount: Exudate Serosanguineous Type: Exudate red, brown Color: Wound Bed Granulation Amount: Large (67-100%) Granulation Quality: Pink, Pale Fascia E Necrotic Amount: None Present (0%) Fat Laye Tendon E Muscle E Joint Ex Bone Exp r After Cleansing: No ibrino Yes Exposed Structure xposed: No r (Subcutaneous Tissue) Exposed: Yes xposed: No xposed: No posed: No osed: No ion in Area: 44.4% ion in Volume: 44.3% alization: Small (1-33%) g: No ning: No Treatment Notes Wound #14 (Right Abdomen - Lower Quadrant) 1.  Cleanse With Wound Cleanser Soap and water 2. Periwound Care Antifungal powder Barrier cream 4. Secondary Dressing ABD Pad Drawtex 5. Secured With Recruitment consultant) Signed: 06/10/2019 1:32:19 PM By: Mikeal Hawthorne EMT/HBOT Signed: 07/29/2019 3:01:15 PM By: Carlene Coria RN Entered By: Mikeal Hawthorne on 06/09/2019 08:46:32 -------------------------------------------------------------------------------- Wound Assessment Details Patient Name: Date of Service: [name] 06/05/2019 2:30 PM Medical Record [id number] Patient Account Number: [id number] Date of Birth/Sex: Treating RN: [birth year] (58 y.o. [name] Primary Care Bishoy Cupp: Margarita Rana Other Clinician: Referring Dalyn Kjos: Treating Eleah Lahaie/Extender:Robson, Stefan Church, Elon Alas in Treatment: 166 Wound Status Wound Number: 28 Primary Diabetic Wound/Ulcer of the Lower Extremity Etiology: Wound Location: Right Upper Leg Wound Open Wounding Event: Gradually Appeared Status: Date Acquired: 02/07/2018 Comorbid Anemia, Lymphedema, Sleep Apnea, Weeks Of Treatment: 69 History: Arrhythmia, Congestive Heart Failure, Clustered Wound: No Hypertension, Peripheral Venous Disease, Type II Diabetes, Gout, Osteoarthritis, Neuropathy, Confinement Anxiety Photos Wound Measurements Length: (cm) 6 % Reduction in Are Width: (cm) 5 % Reduction in Vol Depth: (cm) 0.1 Epithelialization: Area: (cm) 23.562 Tunneling: Volume: (cm) 2.356 Undermining: Wound Description Classification: Grade 1 Foul Odor After C Wound Margin: Flat and Intact Slough/Fibrino Exudate Amount: Large Exudate Type: Serosanguineous Exudate Color: red, brown Wound Bed Granulation Amount: Large (67-100%) Granulation Quality: Red Fascia Exposed: Necrotic Amount: None Present (0%) Fat Layer (Subcut Tendon Exposed: Muscle Exposed: Joint Exposed: Bone Exposed: Treatment Notes Wound #28 (Right Upper Leg) 1.  Cleanse With Wound Cleanser Soap and water 2. Periwound Care Antifungal powder Barrier cream 4. Secondary Dressing ABD Pad Drawtex 5. Secured With Tape leansing: No Yes Exposed Structure No aneous Tissue) Exposed: Yes No No No No a: -404.2% ume: -404.5% Small (1-33%) No No Electronic Signature(s) Signed: 06/10/2019 1:32:19 PM By: Mikeal Hawthorne EMT/HBOT Signed: 07/29/2019 3:01:15 PM By: Carlene Coria RN Entered By: Mikeal Hawthorne on 06/09/2019 08:47:21 -------------------------------------------------------------------------------- Wound Assessment Details Patient Name: Date of Service: [name] 06/05/2019 2:30 PM Medical Record [id number] Patient Account Number: [id number] Date of Birth/Sex: Treating RN: 10-19-1960 (58 y.o. [name] Primary Care Jhoselyn Ruffini: Margarita Rana Other Clinician: Referring Loda Bialas: Treating Beya Tipps/Extender:Robson, Stefan Church, Elon Alas in Treatment: 166 Wound Status Wound Number: 30 Primary MASD Moisture Associated Skin Damage Etiology: Wound Location: Right Abdomen - Lower Quadrant - Medial Wound Open Status: Wounding Event: Gradually Appeared Comorbid Anemia, Lymphedema, Sleep Apnea, Date Acquired: 06/13/2018 History: Arrhythmia, Congestive Heart Failure, Weeks Of Treatment: 51 Hypertension, Peripheral Venous Disease, Clustered Wound: No Type II Diabetes, Gout, Osteoarthritis, Neuropathy, Confinement Anxiety Photos Wound Measurements Length: (cm) 8 % Reductio Width: (cm) 5 % Reduct Depth: (cm) 0.1 Epitheli Area: (cm)  31.416 Tunneli Volume: (cm) 3.142 Undermi Wound Description Wound Margin: Flat and Intact Exudate Amount: Large Exudate Type: Serosanguineous Exudate Color: red, brown Wound Bed Granulation Amount: Large (67-100%) Granulation Quality: Red Necrotic Amount: None Present (0%) Foul Odor After Cleansing: No Slough/Fibrino Yes Exposed Structure Fascia Exposed: No Fat  Layer (Subcutaneous Tissue) Exposed: Yes Tendon Exposed: No Muscle Exposed: No Joint Exposed: No Bone Exposed: No n in Area: -2186.5% ion in Volume: -2193.4% alization: Small (1-33%) ng: No ning: No Treatment Notes Wound #30 (Right, Medial Abdomen - Lower Quadrant) 1. Cleanse With Wound Cleanser Soap and water 2. Periwound Care Antifungal powder Barrier cream 4. Secondary Dressing ABD Pad Drawtex 5. Secured With Recruitment consultant) Signed: 06/10/2019 1:32:19 PM By: Mikeal Hawthorne EMT/HBOT Signed: 07/29/2019 3:01:15 PM By: Carlene Coria RN Entered By: Mikeal Hawthorne on 06/09/2019 08:46:55 -------------------------------------------------------------------------------- Wound Assessment Details Patient Name: Date of Service: [name] 06/05/2019 2:30 PM Medical Record [id number] Patient Account Number: [id number] Date of Birth/Sex: Treating RN: 09-20-1960 (58 y.o. [name]) Carlene Coria Primary Care Thomasine Klutts: Margarita Rana Other Clinician: Referring Samiel Peel: Treating Delylah Stanczyk/Extender:Robson, Stefan Church, Elon Alas in Treatment: 166 Wound Status Wound Number: 9 Primary Diabetic Wound/Ulcer of the Lower Extremity Etiology: Wound Location: Right Lower Leg - Lateral Wound Open Wound Open Wounding Event: Gradually Appeared Status: Date Acquired: 03/10/2016 Comorbid Anemia, Lymphedema, Sleep Apnea, Weeks Of Treatment: 166 History: Arrhythmia, Congestive Heart Failure, Clustered Wound: Yes Hypertension, Peripheral Venous Disease, Type II Diabetes, Gout, Osteoarthritis, Neuropathy, Confinement Anxiety Photos Wound Measurements Length: (cm) 37 Width: (cm) 22 Depth: (cm) 0.3 Clustered Quantity: 2 Area: (cm) 639.314 Volume: (cm) 191.794 Wound Description Classification: Grade 2 Wound Margin: Flat and Intact Exudate Amount: Large Exudate Type: Serosanguineous Exudate Color: red, brown Wound Bed Granulation Amount: Small  (1-33%) Granulation Quality: Pink Necrotic Amount: Large (67-100%) Necrotic Quality: Adherent Slough fter Cleansing: No ino Yes Exposed Structure sed: No Subcutaneous Tissue) Exposed: Yes sed: No sed: No ed: No d: No % Reduction in Area: -41630.7% % Reduction in Volume: -125255.6% Epithelialization: Small (1-33%) Tunneling: No Undermining: No Foul Odor A Slough/Fibr Fascia Expo Fat Layer ( Tendon Expo Muscle Expo Joint Expos Bone Expose Treatment Notes Wound #9 (Right, Lateral Lower Leg) 1. Cleanse With Wound Cleanser Soap and water 2. Periwound Care Barrier cream 3. Primary Dressing Applied Other primary dressing (specifiy in notes) 4. Secondary Dressing ABD Pad Kerramax/Xtrasorb 6. Support Layer Applied 4 layer compression wrap Notes medihoney and calcium alginate Electronic Signature(s) Signed: 06/10/2019 1:32:19 PM By: Mikeal Hawthorne EMT/HBOT Signed: 07/29/2019 3:01:15 PM By: Carlene Coria RN Entered By: Mikeal Hawthorne on 06/09/2019 08:47:50 -------------------------------------------------------------------------------- Vit[place] Details Patient Name: Date of Service: [name] 06/05/2019 2:30 PM Medical Record [id number] Patient Account Number: [id number] Date of Birth/Sex: Treating RN: 04/28/1961 (58 y.o. [name] Primary Care Armanii Urbanik: Margarita Rana Other Clinician: Referring Tryson Lumley: Treating Otho Michalik/Extender:Robson, Stefan Church, Elon Alas in Treatment: 166 Vital Signs Time Taken: 14:58 Temperature (F): 98.6 Height (in): 71 Pulse (bpm): 87 Weight (lbs): 528 Respiratory Rate (breaths/min): 18 Body Mass Index (BMI): 73.6 Blood Pressure (mmHg): 144/66 Capillary Blood Glucose (mg/dl): 156 Reference Range: 80 - 120 mg / dl Notes CBG per patient Electronic Signature(s) Signed: 07/29/2019 3:01:15 PM By: Carlene Coria RN Entered By: Carlene Coria on 06/05/2019 14:58:46

## 2019-07-31 ENCOUNTER — Encounter (HOSPITAL_BASED_OUTPATIENT_CLINIC_OR_DEPARTMENT_OTHER): Payer: Medicare HMO | Admitting: Internal Medicine

## 2019-08-11 ENCOUNTER — Telehealth: Payer: Self-pay

## 2019-08-11 DIAGNOSIS — G8929 Other chronic pain: Secondary | ICD-10-CM

## 2019-08-11 MED ORDER — OXYCODONE HCL 15 MG PO TABS: ORAL_TABLET | ORAL | 0 refills | Status: DC

## 2019-08-11 NOTE — Telephone Encounter (Signed)
Done

## 2019-08-11 NOTE — Telephone Encounter (Signed)
Oxycodone 15 MG  Qty 160 Tablets  PATIENT USES [facility]

## 2019-08-19 ENCOUNTER — Other Ambulatory Visit: Payer: Self-pay | Admitting: Orthopaedic Surgery

## 2019-08-26 ENCOUNTER — Encounter: Payer: Self-pay | Admitting: Orthopaedic Surgery

## 2019-08-26 ENCOUNTER — Other Ambulatory Visit: Payer: Self-pay

## 2019-08-26 ENCOUNTER — Ambulatory Visit: Payer: Medicare HMO | Admitting: Orthopaedic Surgery

## 2019-08-26 DIAGNOSIS — G8929 Other chronic pain: Secondary | ICD-10-CM

## 2019-08-26 DIAGNOSIS — M25561 Pain in right knee: Secondary | ICD-10-CM | POA: Diagnosis not present

## 2019-08-26 DIAGNOSIS — Z6841 Body Mass Index (BMI) 40.0 and over, adult: Secondary | ICD-10-CM

## 2019-08-26 DIAGNOSIS — M25562 Pain in left knee: Secondary | ICD-10-CM | POA: Diagnosis not present

## 2019-08-26 NOTE — Progress Notes (Signed)
PROCEDURE NOTE:  The patient requests injections of the left knee , verbal consent was obtained.  The left knee was prepped appropriately after time out was performed.   Sterile technique was observed and injection of 1 cc of Depo-Medrol 40 mg with several cc's of plain xylocaine. Anesthesia was provided by ethyl chloride and a 20-gauge needle was used to inject the knee area. The injection was tolerated well.  A band aid dressing was applied.  The patient was advised to apply ice later today and tomorrow to the injection sight as needed.  PROCEDURE NOTE:  The patient requests injections of the right knee , verbal consent was obtained.  The right knee was prepped appropriately after time out was performed.   Sterile technique was observed and injection of 1 cc of Depo-Medrol 40 mg with several cc's of plain xylocaine. Anesthesia was provided by ethyl chloride and a 20-gauge needle was used to inject the knee area. The injection was tolerated well.  A band aid dressing was applied.  The patient was advised to apply ice later today and tomorrow to the injection sight as needed.  Return in one month.  Electronically Signed Sanjuana Kava, MD 1/5/202110:14 AM

## 2019-08-28 ENCOUNTER — Other Ambulatory Visit: Payer: Self-pay

## 2019-08-28 ENCOUNTER — Encounter (HOSPITAL_BASED_OUTPATIENT_CLINIC_OR_DEPARTMENT_OTHER): Payer: Medicare HMO | Attending: Internal Medicine | Admitting: Internal Medicine

## 2019-08-29 NOTE — Progress Notes (Signed)
[name], [name] ([id number]) Visit Report for 08/28/2019 Allergy List Details Patient Name: Date of Service: [name] 08/28/2019 1:45 PM Medical Record [id number] Patient Account Number: [id number] Date of Birth/Sex: Treating RN: [birth year] (59 y.o. [name] Primary Care Kentaro Alewine: Margarita Rana Other Clinician: Referring Jaimie Redditt: Treating Shanti Agresti/Extender:Robson, Stefan Church, Elon Alas in Treatment: 178 Allergies Active Allergies No Known Drug Allergies Allergy Notes Electronic Signature(s) Signed: 08/29/2019 4:55:01 AM By: Linton Ham MD Entered By: Linton Ham on 08/28/2019 15:32:44 -------------------------------------------------------------------------------- Wound Assessment Details Patient Name: Date of Service: [name] 08/28/2019 1:45 PM Medical Record [id number] Patient Account Number: [id number] Date of Birth/Sex: Treating RN: 01-Oct-1960 (59 y.o. Lorette Ang, Meta.Reding Primary Care Andra Matsuo: Margarita Rana Other Clinician: Referring Jeancarlo Leffler: Treating Laticha Ferrucci/Extender:Robson, Stefan Church, Elon Alas in Treatment: 178 Wound Status Wound Number: 14 Primary Atypical Etiology: Wound Location: Right Abdomen - Lower Quadrant Wound Open Wounding Event: Gradually Appeared Status: Date Acquired: 04/23/2016 Comorbid Anemia, Lymphedema, Sleep Apnea, Weeks Of Treatment: 173 History: Arrhythmia, Congestive Heart Failure, Clustered Wound: Yes Hypertension, Peripheral Venous Disease, Type II Diabetes, Gout, Osteoarthritis, Neuropathy, Confinement Anxiety Wound Measurements Length: (cm) 1 % Reduction Width: (cm) 3 % Reduction Depth: (cm) 0.1 Epitheli Clustered Quantity: 1 Tunnelin Area: (cm) 2.356 Undermi Volume: (cm) 0.236 Wound Description Full Thickness Without Exposed Support Foul Od Classification: Structures Slough/ Wound Epibole Margin: Exudate  Large Amount: Exudate Serosanguineous Type: Exudate red, brown Color: Wound Bed Granulation Amount: Large (67-100%) Granulation Quality: Pink, Pale Fascia E Necrotic Amount: None Present (0%) Fat Laye Tendon E Muscle E Joint Ex Bone Exp or After Cleansing: No Fibrino Yes Exposed Structure xposed: No r (Subcutaneous Tissue) Exposed: Yes xposed: No xposed: No posed: No osed: No in Area: 44.4% in Volume: 44.3% alization: Small (1-33%) g: No ning: No Electronic Signature(s) Signed: 08/28/2019 5:19:45 PM By: Deon Pilling Entered By: Deon Pilling on 08/28/2019 14:01:14 -------------------------------------------------------------------------------- Wound Assessment Details Patient Name: Date of Service: [name] 08/28/2019 1:45 PM Medical Record [id number] Patient Account Number: [id number] Date of Birth/Sex: Treating RN: 1961/07/15 (59 y.o. Lorette Ang, Meta.Reding Primary Care Korrey Schleicher: Margarita Rana Other Clinician: Referring Meoshia Billing: Treating Leidi Astle/Extender:Robson, Stefan Church, Elon Alas in Treatment: 178 Wound Status Wound Number: 28 Primary Diabetic Wound/Ulcer of the Lower Extremity Etiology: Wound Location: Right Upper Leg Wound Open Wounding Event: Gradually Appeared Status: Date Acquired: 02/07/2018 Comorbid Anemia, Lymphedema, Sleep Apnea, Weeks Of Treatment: 81 History: Arrhythmia, Congestive Heart Failure, Clustered Wound: No Hypertension, Peripheral Venous Disease, Type II Diabetes, Gout, Osteoarthritis, Neuropathy, Confinement Anxiety Wound Measurements Length: (cm) 6 % Reduct Width: (cm) 5 % Reductio Depth: (cm) 0.1 Epithelial Area: (cm) 23.562 Tunneling Volume: (cm) 2.356 Undermini Wound Description Classification: Grade 1 Wound Margin: Flat and Intact Exudate Amount: Large Exudate Type: Serosanguineous Exudate Color: red, brown Wound Bed Granulation Amount: Large (67-100%) Granulation Quality: Red Necrotic  Amount: None Present (0%) Foul Odor After Cleansing: No Slough/Fibrino Yes Exposed Structure Fascia Exposed: No Fat Layer (Subcutaneous Tissue) Exposed: Yes Tendon Exposed: No Muscle Exposed: No Joint Exposed: No Bone Exposed: No ion in Area: -404.2% n in Volume: -404.5% ization: Small (1-33%) : No ng: No Electronic Signature(s) Signed: 08/28/2019 5:19:45 PM By: Deon Pilling Entered By: Deon Pilling on 08/28/2019 14:01:36 -------------------------------------------------------------------------------- Wound Assessment Details Patient Name: Date of Service: [name] 08/28/2019 1:45 PM Medical Record [id number] Patient Account Number: [id number] Date of Birth/Sex: Treating RN: 01/28/1961 (59 y.o. [name] Primary Care Delrico Minehart: Margarita Rana Other Clinician: Referring Arjen Deringer: Treating Hubbert Landrigan/Extender:Robson,  Stefan Church, Marlow Baars Weeks in Treatment: 178 Wound Status Wound Number: 30 Primary MASD Moisture Associated Skin Damage Etiology: Wound Location: Right Abdomen - Lower Quadrant - Medial Wound Open Status: Wounding Event: Gradually Appeared Comorbid Anemia, Lymphedema, Sleep Apnea, Date Acquired: 06/13/2018 History: Arrhythmia, Congestive Heart Failure, Weeks Of Treatment: 63 Hypertension, Peripheral Venous Disease, Clustered Wound: No Type II Diabetes, Gout, Osteoarthritis, Neuropathy, Confinement Anxiety Wound Measurements Length: (cm) 8 Width: (cm) 5 Depth: (cm) 0.1 Area: (cm) 31.416 Volume: (cm) 3.142 Wound Description Wound Margin: Flat and Intact Exudate Amount: Large Exudate Type: Serosanguineous Exudate Color: red, brown Wound Bed Granulation Amount: Large (67-100%) Granulation Quality: Red Necrotic Amount: None Present (0%) fter Cleansing: No ino Yes Exposed Structure sed: No Subcutaneous Tissue) Exposed: Yes sed: No sed: No ed: No d: No % Reduction in Area: -2186.5% % Reduction in Volume:  -2193.4% Epithelialization: Small (1-33%) Tunneling: No Undermining: No Foul Odor A Slough/Fibr Fascia Expo Fat Layer ( Tendon Expo Muscle Expo Joint Expos Bone Expose Electronic Signature(s) Signed: 08/28/2019 5:19:45 PM By: Deon Pilling Entered By: Deon Pilling on 08/28/2019 14:01:55 -------------------------------------------------------------------------------- Wound Assessment Details Patient Name: Date of Service: AARSH, BORTON 08/28/2019 1:45 PM Medical Record [id number] Patient Account Number: [id number] Date of Birth/Sex: Treating RN: 07/28/1961 (59 y.o. Lorette Ang, Meta.Reding Primary Care Lakeith Careaga: Margarita Rana Other Clinician: Referring Askia Hazelip: Treating Ellana Kawa/Extender:Robson, Stefan Church, Elon Alas in Treatment: 178 Wound Status Wound Number: 9 Primary Diabetic Wound/Ulcer of the Lower Etiology: Extremity Wound Location: Right, Lateral Lower Leg Wound Status: Open Wounding Event: Gradually Appeared Date Acquired: 03/10/2016 Weeks Of Treatment: 178 Clustered Wound: Yes Wound Measurements Length: (cm) 37 Width: (cm) 22 Depth: (cm) 0.3 Area: (cm) 639.314 Volume: (cm) 191.794 Wound Description Classification: Grade 2 % Reduction in Area: -41630.7% % Reduction in Volume: -125255.6% Electronic Signature(s) Signed: 08/28/2019 5:19:45 PM By: Deon Pilling Entered By: Deon Pilling on 08/28/2019 14:02:03

## 2019-08-29 NOTE — Progress Notes (Signed)
[name], [name] ([id number]) Visit Report for 08/28/2019 HPI Details Patient Name: Date of Service: [name], [name] 08/28/2019 1:45 PM Medical Record [id number] Patient Account Number: [id number] Date of Birth/Sex: Treating RN: [birth year] (59 y.o. M) Primary Care Provider: Margarita Rana Other Clinician: Referring Provider: Treating Provider/Extender:Emmalynne Courtney, Stefan Church, Elon Alas in Treatment: 80 History of Present Illness HPI Description: this is a patient with morbid obesity with a BMI over 70. He tells me that he hit his right ankle on part of the car in November 2015. He developed an MRSA infection in t.his man is been on and off anti-biotics ever since only recently finishing on Septra.he has also been on Keflex. He has a history of chronic venous ulcerations in his right lower leg and probably some component of the lymphedema. I don't see any recent cultures of this area nor do I think he is had x-rays that I am aware of. He also has a problem with recurrent wounds under his large abdominal pannus. The patient and his sister who accompanies him already seemed well versed in how to care for these. They already have interdry at home and applying nystatin powder. They are able usually to get these to close however there are 2 wounds here 1 on the abdominal pannus and one on his upper right thigh that are not closing. he also has had recurrent cellulitis in this area. 03/05/15; his wounds have not changed much from last week. All wonder underwent a surface debridement. The area on the right lateral malleolus a little more aggressively. There is no evidence of infection. The his sister reports no supplies came i.e. no so over alginate [[employer]]. 03/12/15 I think the wounds in both abdominal and on the anterior thigh looks somewhat better. no debridement was done today. The area on the right lateral malleolus underwent an aggressive debridement with a curette to  remove surface slough and underlying fibrinous exudate. We used a curette for this 03/19/15. Both of the wounds in his abdominal pannus and anterior thigh were selectively debridement with a curette. The area on the right lateral malleolus again underwent an aggressive debridement. This is a deep wound in proximity to the underlying malleolus. It appears that there is drainage here and underlying maceration. 04/09/15. Both wounds in the abdominal pannus and anterior thigh appears smaller with clean granulated bases. The area over the right lateral malleolus again required a selective debridement. This is a deep wound. There is no drainage and no evidence of infection that is obvious 9/2/16both his wound area in the abdominal pannus and anterior thigh are considerably better. The area over the right lateral malleolus is filled in with granulation tissue. No debridement was required in either area. 05/07/15; both his wound areas in the abdominal pannus and anterior right thigh continued to gradually improve with advancing epithelialization. There are no options here other than some over alginate aggressive cleaning and drying and offloading which they're doing with a rolled towel.. The area on the right lateral malleolus is also considerably better 05/28/15; the area on the right lateral malleolus is fully epithelialized and requires nothing but a protective foam dressing. The area on the abdominal pannus on the right and the right anterior thigh also continued to regress in size with impressive degrees of surrounding epithelialization 09/30/15; this is a man who has morbid obesity, chronic venous insufficiency and lymphedema. He also had wounds under his large abdominal pannus on the right side. We care for him through the summer  and fall of 2016. According to his wife they did not return because the wound over the right lateral malleolus and is pannus wounds had healed. Therefore we did not really  provide ongoing compression. They tell me that over the last 4-5 weeks he has redeveloped the wounds on the pannus both on the right lower quadrant of his abdomen and the right upper thigh. More worrisome to them wounds on the right calf in 2 areas anterolateral and a large area anteromedially. They have been using what they had left over from last time including silver alginate, a piece of Hydrofera Blue and apparently Unna boots although I don't remember ordering the latter for them. 10/07/15; the area on the right medial ankle is improved with advancing epithelialization although there is still some open areas and some macerated tissue at risk below the open areas here. I am less concerned about this than I was last week. The area under his pannus on his right thigh is stable and smaller. The area on the abdominal pannus itself is unchanged and was debridement of a gritty surface slough 10/14/15 the area on the right medial leg is still inflamed, there is some maceration. We changed to [facility] Blue last week and I don't think that this is contact dermatitis from [facility] but I feel we should change to something with more absorptive properties. The area on the thigh under the pannus is considerably improved while the abdominal wound is largely unchanged 10/22/15; the patient has had some deterioration in the lower leg wounds with considerable poorly controlled edema in the right leg. There wounds on the lateral and medial aspect of. None of these look to be infected. The pannus wounds are improved on the thigh not improved on the abdomen 04/28/16; patient is been coming here for the last month although I've managed to miss him every Friday. He tells me he developed a wound on his right lateral leg 5 or 6 weeks ago. He saw it in the mirror in the bathroom. no obvious etiology Is using Santyl to this. He also has another wound on the lower abdominal pannus 05/05/16; no obvious change to the wound  on the right lateral leg. Continue using Santyl. Area on his abdominal pannus appears to be improved silver alginate 05/12/16; the right leg wound has more exposed healthy granulation. We seemed to be making some progress. They're using Santyl at home and his wife is changing the dressing including applying an Haematologist. The area on his abdominal pannus appears stable to improved 05/19/16; the right leg has more exposed healthy granulation. Still tightly adherent nonviable tissue on the circumference the requires debridement. We have been using Santyl at home. The area on his abdominal pannus can tenuous to improve 06/09/16; haven't seen this patient in 3 weeks he has been using Santyl at home due to a wound on the right lateral calf. He has chronic venous insufficiency as the presumed etiology. His wife changes the dressings including the lunar wraps. He reappeared here I think in August with this wound. Unfortunately dimensions are worse by roughly 3 x 1 cm today. A lot of necrotic tissue around the circumference of this wound nothing here looks healthy. This is not overtly infected. His pannus wound on the right looks better and smaller 06/16/16; the patient's wound is longer but narrower although the base of this as less adherent slough the changing pattern of this may be feel that this would need to be biopsied and I  went ahead and did this today. His pannus wound does not look much better and there is now a mirror image opening on his anterior thigh on the right. We had been using Iodoflex 06/22/16; biopsy I did last week was negative for malignancy findings suggestive of a stasis ulcer his sister is changing the dressing 1 time with an Unna wrap that she has supplies at home. 06/29/16; wound is somewhat improved on right leg but still a large wound. Both wounds on right pannus on right leg both required debridement. 07/06/16 large wound on his lateral right leg. Most of the surface of this is  much improved versus a month ago at which time he had severe adherent surface slough. The auto flexes help with this. He also has 2 wounds one on his right lower abdominal pannus and the upper part of his right thigh. These are probably friction area wounds 07/20/16; this man has a large venous insufficiency wound on the lateral right leg. Biopsy previously negative. Over the last month we managed to get the surface of this down to something that looks healthy. He also has 2 wounds on his right lower abdominal pannus and the anterior right thigh underneath it. Using silver alginate here 07/28/16; patient had a fall yesterday hurting his left ankle therefore he missed his appointment. X-rays in the ER were negative. Having some discomfort. We have been using silver alginate to the areas on his right lateral calf and also the pannus wounds 08/04/16; large wound on the lateral aspect of his right calf in the setting of severe chronic venous insufficiency and inflammation. We finally have this down to a reasonably healthy base although debridement was still required today. We have been using Hydrofera Blue 08/18/16; large wound on the lateral aspect of his right calf in the setting of chronic venous insufficiency. Measures smaller in terms of with length is about the same base of this wound looks stable although there is still protruding granulation. This suggests more pressure is required. He has 2 new wounds inferiorly which she says are from traumatizing the leg against the bed frame. Has been using silver alginate to the pannus and thigh wounds. Hydrofera Blue to the major wounds on the right 09/01/16 substantial wound on the lateral aspect of his right calf just below the knee in the setting of chronic venous hypertension and lymphedema. This measures slightly smaller although this is a substantial wound. He has been using Hydrofera Blue here. 2 wounds involving the right pannus and right anterior  thigh using silver alginate 09/15/16; substantial wound on the lateral aspect of his right calf just below the knee in the setting of severe chronic venous hypertension and lymphedema. He has a substantial area of epithelialization here superiorly however in the middle of it or to open areas when I was questioning him about this his sister states that he hit this on the bed frame and he admits to doing so. Other than at the wound itself is substantial but appears to be healthy. He has 2 small open areas below the wound which apparently were also secondary to trauma on the bed frame. Finally he has the areas on the right abdominal pannus, right anterior thigh 09/28/16; substantial wound on the lateral aspect of his right calf just below the knee in the setting of chronic venous hypertension and lymphedema. He has done well in this wound is better certainly narrower with a reasonably healthy granulation. He continues to have 2 small open areas  on the right abdominal pannus in the right anterior thigh and a G0 largely friction type wounds from the 2 surfaces. These have largely Stolled we have been using Hydrofera Blue on the right leg and silver alginate long-standing on the pannus wounds also on the right 10/13/16; substantial wound on the lateral aspect of his right Is definitely down in dimensions. This is the setting of chronic venous hypertension and lymphedema. He has some secondary lesions inferiorly which I think we are measuring overall wound area. We have been using Hydrofera Blue under 4-layer compression. 10/27/16; slightly smaller in terms of wound dimensions. Visually the wound looks stable, I was looking for some improvement in the overall dimensions beyond what we actually have. I'm going to give this another 2 weeks with an unchanged dressing however if there are not more impressive changes I'll consider a substantial wound debridement 11/09/16; no major changes in the condition of the  wound on the left lateral calf. This time however covered in a tightly adherent necrotic surface. No surrounding infection. The wounds in his pannus slightly smaller the area on the right anterior thigh slightly larger although the patient states with the death of his sister he has been up a lot more. He has not been systemically unwell. The edema in the leg looks reasonably well controlled 11/24/16-he is here for follow-up evaluation. He continues to have a significant lesion in the lateral aspect of his leg. He admits he is aware that this is exacerbated by pressure but has been unable to find a way to consistently or effectively offload this. He has voiced seeing no complaints or concerns 12/08/16; here for follow-up evaluation significant wound on the right lateral leg still to refractory areas in the right lower abdominal pannus and is on his right thigh. He arrives today with fairly good edema control 12/21/16; patient is here for follow-up of an extensive wound on the right lateral leg below the knee as well as the right lower abdominal pannus and right upper thigh wound. We have been using Hydrofera Blue to the right leg wound and silver alginate with interdry to the pannus wounds which appear to be making good progress. 01/05/17; extensive area on the right lateral leg below the knee. No major change in this. Thick adherent necrotic surface. We have been using Hydrofera Blue without apparent improvement. He also has wounds on his pannus both on the abdomen and the thigh not much change from last time we have been using silver alginate here 01/19/17- He is here for follow up evaluation. He states that his glucose levels have been fluctuating between 150-200; this morning fasting level 162. He is voicing no concerns, complaints, no fever/chills. 02/02/17; as far as his wounds are concerned things are not going particularly well. He has a new wound in the pannus medially to the original wound. He  arrives today with this area moist malodorous. The area on the right lateral leg some improved although he still has a deeper satellite area underneath the original wound. 02/16/17. 2 week visit using Iodoflex on the leg wounds and silver alginate on his pannus wounds. 03/09/17; haven't seen this man in about 3 weeks. His abdominal/upper thigh pannus wounds look a lot better using silver alginate right lateral leg wound perhaps some visual improvement in terms of dimensions but the dimensions and measurements or not that different. 04/27/17; seen by Dr. Con Memos while I was oriented vacation early last month. Using [employer] [facility] with continued improvement in the conditions  of the wound bed. He is using silver alginate to the wounds in the abdominal/thigh pannus 06/01/17; patient has not been seen here in over a month. He is been using Hydrofera Blue with continued improvement in the conditions of the wound bed on the right lateral leg. He uses silver alginate to the abdominal/thigh pannus area with inter-dry and rolled gauze 06/15/17; two-week follow-up appointment. Large wound on the right lateral leg predominantly venous insufficiency. He has a smaller satellite lesion under that wound. These have been making progress with [facility] He has to pannus wounds one on the lower abdomen one on the upper right thigh. 06/29/17; 2 week follow-up appointment. He tells me that he had cellulitis on the lower abdominal pannus/lymphedema. This was treated by phone with phone and antibiotics by his primary physician using Keflex 500 mg by mouth every 6. Large wound on the right lateral thigh we've been using Hydrofera Blue. This is venous insufficiency wound. Smaller lesion under the wound. We have much better surfaces with the Hydrofera Blue Pannus wounds on the abdominal and thigh on the right 07/27/17; with regards to his right lateral leg he has a "Coke bottle" shaped wound on the left lateral leg this  appears to be somewhat better perhaps slightly smaller, certainly a better-looking surface. He has satellite wounds underneath this with adherent necrotic debris and a new small wound laterally. He also has 4 small open areas anteriorly on the left leg which may be a wrap injury or scissor injury His pannus wounds are really about the same. he has Interdry. However I don't believe this is offloading this enough or keeping this dry enough 08/10/17; not much change in any of these wounds perhaps slightly better and the major wounds superiorly and slightly worse in the satellite lesion inferior to the major wound. He also has a second satellite lesion medially. We have been using Hydrofera Blue He tells me he is being more aggressive offloading the wounds on the right pannus both in the lower abdomen and anterior thigh seen silver alginate here 08/24/17; lateral right leg wound looks about the same a bit disappointing given the aggressive debridement I did 2 weeks ago. The satellite lesion underneath this is worse and requires debridement He has the usual wounds on the inferior part of his pannus and the underlying thigh however deep in the recess there is new open areas here 3 measured as a small cluster 09/07/17; lateral right leg wound had some necrotic debris in the mid aspect. Our intake nurse noted what appears to be striated muscle although I am not exactly sure of this after this visit. It did not move with flexion and extension. I been assuming that this is a a venous ulcer associated with venous inflammation and secondary lymphedema. He also has an enlarging wound which was the satellite lesion underneath this. Finally he has the area under his abdominal pannus and the area of his thigh under the abdominal pannus on the right. We have been using silver alginate 09/28/17; the lateral right leg bottle shaped wound is about the same. What started as a small satellite lesion below this is rapidly  expanded and I biopsied this. I had previously biopsied the large wound itself in late 2017. His pannus wounds look better. We have use silver alginate for a long period 10/12/17; the lateral right leg bottle-shaped wounds superiorly is about the samestill adherent and slough covered.. The satellite area inferiorly has copious amounts of necrotic material. Culture I  did last time showed Pseudomonassensitive to ciprofloxacin and he is finishing this now. Tolerating it well. Biopsy I did did not show any malignancy or other abnormalities. Suggestive of a chronic venous ulcer 10/25/17; the lateral right leg bottle shaped wound continues to be about the same. At the bottom this is beginning to cold less with what was a satellite lesion underneath it I suspect both of these will merge at some point. He has what appears to be protruding muscle through the middle part of the wound. I've been attempting to put enough compression on this area to counteract that. He has lymphedema and I wonder about compression pumps. He has [employer] 11/22/17; chronic venous insufficiency with inflammation and lymphedema. The area on the right calf is original wound is may be slightly smaller however the wounds have now continued down his leg and now has a new one on the lateral malleolus and 2 new areas on the anterior calf. Looking through his records I don't see arterial insufficiency although his peripheral pulses are palpable. He also has wounds under the abdominal pannus and on the front of his right thigh. We are using silver alginate absorbent dressings in this area 12/20/17; chronic venous insufficiency with inflammation and lymphedema. The patient has 3 separate wounds on the right lateral calf. This goes from just below his knee down to the right lateral malleolus. He has a new wound medially on the right calf. Several excoriated areas anteriorly. We have been using silver alginate, the wound area makes any  other type of dressing difficult simply because of the volume of open areas. His nephew changes his wrap and per our intake nurse he does a good job. He does not have compression pumps. 01/10/18; chronic venous insufficiency with inflammation and lymphedema. He has wounds right along the right lateral calf. Also wounds on the upper abdomen under the pannus and the anterior right thigh. We've been using Fibracol. He has had some improvement in the superior part of the leg wound. He uses 4 layer compression that his nephew puts on him and he apparently doesn't good job. He received a phone call to say that his insurance would not cover compression pumps 02/07/18; chronic venous insufficiency with inflammation and lymphedema. He is not eligible for compression pumps through his insurance. We are using 4 layer compression. He has had some improvement in the inferior part of the large wound on his left lateral leg but some deterioration in the superior aspect. There was some drainage coming out of this. I didn't see any evidence of infection 03/07/18; chronic venous insufficiency with inflammation and lymphedema. We now think there may be an avenue to get compression pumps covered through his insurance. We are using 4 layer compression. We are using Furber call to the large wound area which I think are looking some better in the dimensions are better. Some of the smaller wounds on the anterior right leg healed. The area between the large abdominal pannus and the anterior right thigh is worse. The wound on the thigh is worse. There is surrounding fungal dermatitis which is probably Candida intertrigo. The areas reasonably substantial 04/18/18; this patient I have not seen in about a month and a half. He tells me since he is been here he had fairly extensive cellulitis involving his lower abdominal pannus and anterior right thigh. Wounds in this area are therefore today quite a bit worse. He also has had  worsening of the large wounds along his lateral right  calf which as I remember things were quite a bit better 6 weeks ago. He does have his lymphedema pumps apparently on approval status but still not delivered to his home. We have been using Fibracol. Limited amount of dressings we can use secondary to the substantial overall wound area 05/16/2018; monthly follow-up. The patient has obtained his external compression pumps and use them 4 times. There is a remarkable improvement in the amount of edema in the right lower leg and some improvement in the generalized erythema especially on the right lateral calf. However he continues to have a substantial wound area basically from just around the lateral malleolus up to his fibular head. He seems to tolerate this remarkably well. We have been using purachol 06/13/2018 monthly follow-up. Patient states he uses his compression pumps once a day sometimes twice. He has a lot of improvement in the edema but still a colossal full-thickness wound on the right lateral calf all the way from the ankle to just below the knee. She also has a new area medially in the lower abdominal pannus We have been using silver alginate on the abdominal pannus/thigh wounds and pure call on the right lateral calf. He uses ABD pads to try and push the bulging muscle back into the calf area I certainly encourage this 07/04/2018; patient is using compression pumps at home. A lot of improvement in the edema but still a colossal full-thickness wound on the right lateral calf from the ankle to the knee. This has exposed muscle. She has 3 areas on the pannus of his abdomen to on the abdominal side and one on the right anterior thigh we have been using silver alginate 08/01/2018. Large wound on the right lateral calf and ankle. Things look actually stable here. Surface is stable he has some epithelialization. Edema control is adequate using compression pumps 1-2 times per day In the  large abdominal folds he still has the area on the anterior thigh he has 3 wounds on the pannus we are using silver alginate 08/30/2018; the large wound on his right lateral calf and ankle. Miraculously looks some better. He has large rims of skin coming inferiorly distally and more proximally superiorly. This really looks like it is doing well. Macerated wounds on his pannus all about the same. There are 3 wounds 2 on the thigh one on the abdomen. Areas moist. They have been using silver alginate 2/7/; large anterior calf wounds on the right lateral calf and ankle. He has been using Fibracol. Arrives today with a very adherent necrotic surface that I could not wash off with Anasept. No change in the pannus wounds using silver alginate here 3/6; large wounds on the right lateral calf and ankle. Actually has a better looking surface. Furthermore some epithelialization. This is a very large wound but appears to be making some progress there using silver alginate He has 3 wounds. In the abdominal pannus against his thigh one on the lower abdomen, one right in the inguinal area and one on the anterior thigh. These areas are wet and macerated. Simply too much pressure here 4/13; telehealth visit out of concerns for the Covid epidemic. We did not have clear images. The patient is using silver alginate but he ran out of this and is using Hydrofera Blue on his leg and silver alginate on the wounds on his thigh and pannus area on the right. 5/7; some improvement in the right lateral leg wound using silver alginate however he has 2 large areas  on the lower abdominal pannus one on the right anterior thigh that really are larger and remain in constantly warm macerated situation. 6/12; the area on the right lateral leg perhaps somewhat less in terms of width but the entire wound surface is covered with a very adherent debris which I think inhibits any degree of epithelialization. We have been using  silver alginate to this large wound area, there is not much else to choose from. We did try Fibracol for a while. This did not really help. He has the areas on the lower abdominal pannus and upper thigh we have simply been using drawtex to this and things look somewhat better today. 7/27; no change in the right lateral leg wound covered in very adherent surface slough. The areas on the abdomen and pannus and upper thigh on the right actually look better. We have been using silver alginate to all wounds. 8/27; right lateral leg wound is 100% slough covered very adherent. The area on the abdomen and pannus upper thigh all look better. We have been using just drawtex on these areas. He is using his external compression pumps at home 10/15; right lateral leg wound is 100% slough covered very adherent. We have been using Mehdi honey. This is inexpensive somewhat antibacterial. It does not seem to be making any headway as with the amount of eschar here. I can no longer debride him in this clinic. There is simply too much bleeding. He is not on blood thinners. I suspect this may be severe venous hypertension related to cor pulmonale/obesity hypoventilation syndrome 08/28/2019 TELEHEALTH visit. I saw this patient via telehealth visit today. I am accompanied by her clinical nurse and the patient had his care attendant who is his nephew. We have been using Medihoney with alginate to the large area on the right leg he is also using Medihoney to the pannus wounds. These have been going really quite well according to the nephew. Electronic Signature(s) Signed: 08/29/2019 4:55:01 AM By: Linton Ham MD Entered By: Linton Ham on 08/28/2019 15:35:48 -------------------------------------------------------------------------------- Physical Exam Details Patient Name: Date of Service: [name], [name] 08/28/2019 1:45 PM Medical Record [id number] Patient Account Number: [id number] Date of  Birth/Sex: Treating RN: 1961/02/02 (59 y.o. M) Primary Care Provider: Margarita Rana Other Clinician: Referring Provider: Treating Provider/Extender:Eoin Willden, Stefan Church, Elon Alas in Treatment: 178 Constitutional Sitting or standing Blood Pressure is within target range for patient.. Pulse regular and within target range for patient.Marland Kitchen Respirations regular, non-labored and within target range.. Temperature is normal and within the target range for the patient.Marland Kitchen Appears in no distress. Notes Wound exam The extensive area on the right lateral calf actually looks smaller with a better looking wound surface. Pannus wounds including x2 on the abdominal area and x1 on the anterior thigh also look improved. Electronic Signature(s) Signed: 08/29/2019 4:55:01 AM By: Linton Ham MD Entered By: Linton Ham on 08/28/2019 15:36:46 -------------------------------------------------------------------------------- Physician Orders Details Patient Name: Date of Service: [name], [name] 08/28/2019 1:45 PM Medical Record [id number] Patient Account Number: [id number] Date of Birth/Sex: Treating RN: December 06, 1960 (59 y.o. Lorette Ang, Meta.Reding Primary Care Provider: Margarita Rana Other Clinician: Referring Provider: Treating Provider/Extender:Berish Bohman, Stefan Church, Elon Alas in Treatment: [phone] Verbal / Phone Orders: No Diagnosis Coding ICD-10 Coding Code Description [phone] Non-pressure chronic ulcer of right calf with necrosis of muscle I87.331 Chronic venous hypertension (idiopathic) with ulcer and inflammation of right lower extremity Unspecified open wound of abdominal wall, right lower quadrant without penetration into S31.103D peritoneal  cavity, subsequent encounter [phone] 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 - Telehealth. Dressing Change Frequency Change Dressing every other day. - all wounds Skin Barriers/Peri-Wound Care Wound #14 Right Abdomen - Lower Quadrant Antifungal powder - nystatin powder to abdominal skin folds Barrier cream - to periwound. Wound #30 Right,Medial Abdomen - Lower Quadrant Barrier cream - to periwound. Wound #9 Right,Lateral Lower Leg Barrier cream Moisturizing lotion Wound Cleansing Wound #9 Right,Lateral Lower Leg May shower with protection. - do not get right leg wet Other: - Clean all wounds with gauze and Anasept Primary Wound Dressing Wound #14 Right Abdomen - Lower Quadrant Other: - drawtex cut to fit wound bed margins. Wound #28 Right Upper Leg Other: - drawtex cut to fit wound bed margins. Wound #30 Right,Medial Abdomen - Lower Quadrant Other: - drawtex cut to fit wound bed margins. Wound #9 Right,Lateral Lower Leg Medihoney Alginate - sheets apply to wound bed. Secondary Dressing Wound #14 Right Abdomen - Lower Quadrant ABD pad Wound #28 Right Upper Leg ABD pad Wound #30 Right,Medial Abdomen - Lower Quadrant ABD pad Wound #9 Right,Lateral Lower Leg ABD pad - Roll and layer ABDs under compression wrap to apply pressure to muscle. Kerramax - or Zetuvit Edema Control 4 layer compression - Right Lower Extremity Avoid standing for long periods of time Elevate legs to the level of the heart or above for 30 minutes daily and/or when sitting, a frequency of: - throughout the day Segmental Compressive Device. - Lymphedema pumps twice a day for 1 hour each Electronic Signature(s) Signed: 08/28/2019 5:19:45 PM By: Deon Pilling Signed: 08/29/2019 4:55:01 AM By: Linton Ham MD Entered By: Deon Pilling on 08/28/2019 14:08:25 -------------------------------------------------------------------------------- Problem List Details Patient Name: Date of  Service: [name], [name] 08/28/2019 1:45 PM Medical Record [id number] Patient Account Number: [id number] Date of Birth/Sex: Treating RN: 01/04/1961 (59 y.o. 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 07/06/2016 No Yes of muscle I87.331 Chronic venous hypertension (idiopathic) with ulcer 03/24/2016 No Yes and inflammation of right lower extremity S31.103D Unspecified open wound of abdominal wall, right lower 07/06/2016 No Yes quadrant without penetration into peritoneal cavity, subsequent encounter E08.622 Diabetes mellitus due to underlying condition with 03/24/2016 No Yes other skin ulcer I89.0 Lymphedema, not elsewhere classified 10/25/2017 No Yes E66.01 Morbid (severe) obesity due to excess calories 03/24/2016 No Yes L97.112 Non-pressure chronic ulcer of right thigh with fat layer 07/04/2018 No Yes exposed Inactive Problems Resolved Problems Electronic Signature(s) Signed: 08/29/2019 4:55:01 AM By: Linton Ham MD Entered By: Linton Ham on 08/28/2019 15:33:01 -------------------------------------------------------------------------------- Progress Note Details Patient Name: Date of Service: [name] 08/28/2019 1:45 PM Medical Record [id number] Patient Account Number: [id number] Date of Birth/Sex: Treating RN: 1961/01/01 (59 y.o. M) Primary Care Provider: Margarita Rana Other Clinician: Referring Provider: Treating Provider/Extender:Kilian Schwartz, Stefan Church, Elon Alas in Treatment: 178 Subjective History of Present Illness (HPI) this is a patient with morbid obesity with a BMI over 70. He tells me that he hit his right ankle on part of the car in November  2015. He developed an MRSA infection in t.his man is  been on and off anti-biotics ever since only recently finishing on Septra.he has also been on Keflex. He has a history of chronic venous ulcerations in his right lower leg and probably some component of the lymphedema. I don't see any recent cultures of this area nor do I think he is had x-rays that I am aware of. He also has a problem with recurrent wounds under his large abdominal pannus. The patient and his sister who accompanies him already seemed well versed in how to care for these. They already have interdry at home and applying nystatin powder. They are able usually to get these to close however there are 2 wounds here 1 on the abdominal pannus and one on his upper right thigh that are not closing. he also has had recurrent cellulitis in this area. 03/05/15; his wounds have not changed much from last week. All wonder underwent a surface debridement. The area on the right lateral malleolus a little more aggressively. There is no evidence of infection. The his sister reports no supplies came i.e. no so over alginate [[employer]]. 03/12/15 I think the wounds in both abdominal and on the anterior thigh looks somewhat better. no debridement was done today. The area on the right lateral malleolus underwent an aggressive debridement with a curette to remove surface slough and underlying fibrinous exudate. We used a curette for this 03/19/15. Both of the wounds in his abdominal pannus and anterior thigh were selectively debridement with a curette. The area on the right lateral malleolus again underwent an aggressive debridement. This is a deep wound in proximity to the underlying malleolus. It appears that there is drainage here and underlying maceration. 04/09/15. Both wounds in the abdominal pannus and anterior thigh appears smaller with clean granulated bases. The area over the right lateral malleolus again required a selective debridement. This is a  deep wound. There is no drainage and no evidence of infection that is obvious 9/2/16both his wound area in the abdominal pannus and anterior thigh are considerably better. The area over the right lateral malleolus is filled in with granulation tissue. No debridement was required in either area. 05/07/15; both his wound areas in the abdominal pannus and anterior right thigh continued to gradually improve with advancing epithelialization. There are no options here other than some over alginate aggressive cleaning and drying and offloading which they're doing with a rolled towel.. The area on the right lateral malleolus is also considerably better 05/28/15; the area on the right lateral malleolus is fully epithelialized and requires nothing but a protective foam dressing. The area on the abdominal pannus on the right and the right anterior thigh also continued to regress in size with impressive degrees of surrounding epithelialization 09/30/15; this is a man who has morbid obesity, chronic venous insufficiency and lymphedema. He also had wounds under his large abdominal pannus on the right side. We care for him through the summer and fall of 2016. According to his wife they did not return because the wound over the right lateral malleolus and is pannus wounds had healed. Therefore we did not really provide ongoing compression. They tell me that over the last 4-5 weeks he has redeveloped the wounds on the pannus both on the right lower quadrant of his abdomen and the right upper thigh. More worrisome to them wounds on the right calf in 2 areas anterolateral and a large area anteromedially. They have been using what they had left over from last time including silver alginate, a piece of  Hydrofera Blue and apparently Unna boots although I don't remember ordering the latter for them. 10/07/15; the area on the right medial ankle is improved with advancing epithelialization although there is still some open  areas and some macerated tissue at risk below the open areas here. I am less concerned about this than I was last week. The area under his pannus on his right thigh is stable and smaller. The area on the abdominal pannus itself is unchanged and was debridement of a gritty surface slough 10/14/15 the area on the right medial leg is still inflamed, there is some maceration. We changed to [facility] Blue last week and I don't think that this is contact dermatitis from [facility] but I feel we should change to something with more absorptive properties. The area on the thigh under the pannus is considerably improved while the abdominal wound is largely unchanged 10/22/15; the patient has had some deterioration in the lower leg wounds with considerable poorly controlled edema in the right leg. There wounds on the lateral and medial aspect of. None of these look to be infected. The pannus wounds are improved on the thigh not improved on the abdomen 04/28/16; patient is been coming here for the last month although I've managed to miss him every Friday. He tells me he developed a wound on his right lateral leg 5 or 6 weeks ago. He saw it in the mirror in the bathroom. no obvious etiology Is using Santyl to this. He also has another wound on the lower abdominal pannus 05/05/16; no obvious change to the wound on the right lateral leg. Continue using Santyl. Area on his abdominal pannus appears to be improved silver alginate 05/12/16; the right leg wound has more exposed healthy granulation. We seemed to be making some progress. They're using Santyl at home and his wife is changing the dressing including applying an Haematologist. The area on his abdominal pannus appears stable to improved 05/19/16; the right leg has more exposed healthy granulation. Still tightly adherent nonviable tissue on the circumference the requires debridement. We have been using Santyl at home. The area on his abdominal pannus can tenuous to  improve 06/09/16; haven't seen this patient in 3 weeks he has been using Santyl at home due to a wound on the right lateral calf. He has chronic venous insufficiency as the presumed etiology. His wife changes the dressings including the lunar wraps. He reappeared here I think in August with this wound. Unfortunately dimensions are worse by roughly 3 x 1 cm today. A lot of necrotic tissue around the circumference of this wound nothing here looks healthy. This is not overtly infected. His pannus wound on the right looks better and smaller 06/16/16; the patient's wound is longer but narrower although the base of this as less adherent slough the changing pattern of this may be feel that this would need to be biopsied and I went ahead and did this today. His pannus wound does not look much better and there is now a mirror image opening on his anterior thigh on the right. We had been using Iodoflex 06/22/16; biopsy I did last week was negative for malignancy findings suggestive of a stasis ulcer his sister is changing the dressing 1 time with an Unna wrap that she has supplies at home. 06/29/16; wound is somewhat improved on right leg but still a large wound. Both wounds on right pannus on right leg both required debridement. 07/06/16 large wound on his lateral right leg. Most  of the surface of this is much improved versus a month ago at which time he had severe adherent surface slough. The auto flexes help with this. He also has 2 wounds one on his right lower abdominal pannus and the upper part of his right thigh. These are probably friction area wounds 07/20/16; this man has a large venous insufficiency wound on the lateral right leg. Biopsy previously negative. Over the last month we managed to get the surface of this down to something that looks healthy. He also has 2 wounds on his right lower abdominal pannus and the anterior right thigh underneath it. Using silver alginate here 07/28/16; patient  had a fall yesterday hurting his left ankle therefore he missed his appointment. X-rays in the ER were negative. Having some discomfort. We have been using silver alginate to the areas on his right lateral calf and also the pannus wounds 08/04/16; large wound on the lateral aspect of his right calf in the setting of severe chronic venous insufficiency and inflammation. We finally have this down to a reasonably healthy base although debridement was still required today. We have been using Hydrofera Blue 08/18/16; large wound on the lateral aspect of his right calf in the setting of chronic venous insufficiency. Measures smaller in terms of with length is about the same base of this wound looks stable although there is still protruding granulation. This suggests more pressure is required. He has 2 new wounds inferiorly which she says are from traumatizing the leg against the bed frame. Has been using silver alginate to the pannus and thigh wounds. Hydrofera Blue to the major wounds on the right 09/01/16 substantial wound on the lateral aspect of his right calf just below the knee in the setting of chronic venous hypertension and lymphedema. This measures slightly smaller although this is a substantial wound. He has been using Hydrofera Blue here. 2 wounds involving the right pannus and right anterior thigh using silver alginate 09/15/16; substantial wound on the lateral aspect of his right calf just below the knee in the setting of severe chronic venous hypertension and lymphedema. He has a substantial area of epithelialization here superiorly however in the middle of it or to open areas when I was questioning him about this his sister states that he hit this on the bed frame and he admits to doing so. Other than at the wound itself is substantial but appears to be healthy. He has 2 small open areas below the wound which apparently were also secondary to trauma on the bed frame. Finally he has the  areas on the right abdominal pannus, right anterior thigh 09/28/16; substantial wound on the lateral aspect of his right calf just below the knee in the setting of chronic venous hypertension and lymphedema. He has done well in this wound is better certainly narrower with a reasonably healthy granulation. He continues to have 2 small open areas on the right abdominal pannus in the right anterior thigh and a G0 largely friction type wounds from the 2 surfaces. These have largely Stolled we have been using Hydrofera Blue on the right leg and silver alginate long-standing on the pannus wounds also on the right 10/13/16; substantial wound on the lateral aspect of his right Is definitely down in dimensions. This is the setting of chronic venous hypertension and lymphedema. He has some secondary lesions inferiorly which I think we are measuring overall wound area. We have been using Hydrofera Blue under 4-layer compression. 10/27/16; slightly smaller in terms  of wound dimensions. Visually the wound looks stable, I was looking for some improvement in the overall dimensions beyond what we actually have. I'm going to give this another 2 weeks with an unchanged dressing however if there are not more impressive changes I'll consider a substantial wound debridement 11/09/16; no major changes in the condition of the wound on the left lateral calf. This time however covered in a tightly adherent necrotic surface. No surrounding infection. The wounds in his pannus slightly smaller the area on the right anterior thigh slightly larger although the patient states with the death of his sister he has been up a lot more. He has not been systemically unwell. The edema in the leg looks reasonably well controlled 11/24/16-he is here for follow-up evaluation. He continues to have a significant lesion in the lateral aspect of his leg. He admits he is aware that this is exacerbated by pressure but has been unable to find a way to  consistently or effectively offload this. He has voiced seeing no complaints or concerns 12/08/16; here for follow-up evaluation significant wound on the right lateral leg still to refractory areas in the right lower abdominal pannus and is on his right thigh. He arrives today with fairly good edema control 12/21/16; patient is here for follow-up of an extensive wound on the right lateral leg below the knee as well as the right lower abdominal pannus and right upper thigh wound. We have been using Hydrofera Blue to the right leg wound and silver alginate with interdry to the pannus wounds which appear to be making good progress. 01/05/17; extensive area on the right lateral leg below the knee. No major change in this. Thick adherent necrotic surface. We have been using Hydrofera Blue without apparent improvement. He also has wounds on his pannus both on the abdomen and the thigh not much change from last time we have been using silver alginate here 01/19/17- He is here for follow up evaluation. He states that his glucose levels have been fluctuating between 150-200; this morning fasting level 162. He is voicing no concerns, complaints, no fever/chills. 02/02/17; as far as his wounds are concerned things are not going particularly well. He has a new wound in the pannus medially to the original wound. He arrives today with this area moist malodorous. The area on the right lateral leg some improved although he still has a deeper satellite area underneath the original wound. 02/16/17. 2 week visit using Iodoflex on the leg wounds and silver alginate on his pannus wounds. 03/09/17; haven't seen this man in about 3 weeks. His abdominal/upper thigh pannus wounds look a lot better using silver alginate right lateral leg wound perhaps some visual improvement in terms of dimensions but the dimensions and measurements or not that different. 04/27/17; seen by Dr. Con Memos while I was oriented vacation early last month.  Using [facility] with continued improvement in the conditions of the wound bed. He is using silver alginate to the wounds in the abdominal/thigh pannus 06/01/17; patient has not been seen here in over a month. He is been using Hydrofera Blue with continued improvement in the conditions of the wound bed on the right lateral leg. He uses silver alginate to the abdominal/thigh pannus area with inter-dry and rolled gauze 06/15/17; two-week follow-up appointment. Large wound on the right lateral leg predominantly venous insufficiency. He has a smaller satellite lesion under that wound. These have been making progress with Hydrofera Blue ooHe has to pannus wounds one on the  lower abdomen one on the upper right thigh. 06/29/17; 2 week follow-up appointment. He tells me that he had cellulitis on the lower abdominal pannus/lymphedema. This was treated by phone with phone and antibiotics by his primary physician using Keflex 500 mg by mouth every 6. ooLarge wound on the right lateral thigh we've been using Hydrofera Blue. This is venous insufficiency wound. Smaller lesion under the wound. We have much better surfaces with the Hydrofera Blue ooPannus wounds on the abdominal and thigh on the right 07/27/17; with regards to his right lateral leg he has a "Coke bottle" shaped wound on the left lateral leg this appears to be somewhat better perhaps slightly smaller, certainly a better-looking surface. He has satellite wounds underneath this with adherent necrotic debris and a new small wound laterally. He also has 4 small open areas anteriorly on the left leg which may be a wrap injury or scissor injury His pannus wounds are really about the same. he has Interdry. However I don't believe this is offloading this enough or keeping this dry enough 08/10/17; not much change in any of these wounds perhaps slightly better and the major wounds superiorly and slightly worse in the satellite lesion inferior to  the major wound. He also has a second satellite lesion medially. We have been using [facility] tells me he is being more aggressive offloading the wounds on the right pannus both in the lower abdomen and anterior thigh seen silver alginate here 08/24/17; lateral right leg wound looks about the same a bit disappointing given the aggressive debridement I did 2 weeks ago. The satellite lesion underneath this is worse and requires debridement ooHe has the usual wounds on the inferior part of his pannus and the underlying thigh however deep in the recess there is new open areas here o3 measured as a small cluster 09/07/17; lateral right leg wound had some necrotic debris in the mid aspect. Our intake nurse noted what appears to be striated muscle although I am not exactly sure of this after this visit. It did not move with flexion and extension. I been assuming that this is a a venous ulcer associated with venous inflammation and secondary lymphedema. He also has an enlarging wound which was the satellite lesion underneath this. Finally he has the area under his abdominal pannus and the area of his thigh under the abdominal pannus on the right. We have been using silver alginate 09/28/17; the lateral right leg bottle shaped wound is about the same. What started as a small satellite lesion below this is rapidly expanded and I biopsied this. I had previously biopsied the large wound itself in late 2017. His pannus wounds look better. We have use silver alginate for a long period 10/12/17; the lateral right leg bottle-shaped wounds superiorly is about the samestill adherent and slough covered.. The satellite area inferiorly has copious amounts of necrotic material. Culture I did last time showed Pseudomonassensitive to ciprofloxacin and he is finishing this now. Tolerating it well. Biopsy I did did not show any malignancy or other abnormalities. Suggestive of a chronic venous ulcer 10/25/17; the  lateral right leg bottle shaped wound continues to be about the same. At the bottom this is beginning to cold less with what was a satellite lesion underneath it I suspect both of these will merge at some point. He has what appears to be protruding muscle through the middle part of the wound. I've been attempting to put enough compression on this area to counteract  that. He has lymphedema and I wonder about compression pumps. He has [employer] 11/22/17; chronic venous insufficiency with inflammation and lymphedema. The area on the right calf is original wound is may be slightly smaller however the wounds have now continued down his leg and now has a new one on the lateral malleolus and 2 new areas on the anterior calf. Looking through his records I don't see arterial insufficiency although his peripheral pulses are palpable. He also has wounds under the abdominal pannus and on the front of his right thigh. We are using silver alginate absorbent dressings in this area 12/20/17; chronic venous insufficiency with inflammation and lymphedema. The patient has 3 separate wounds on the right lateral calf. This goes from just below his knee down to the right lateral malleolus. He has a new wound medially on the right calf. Several excoriated areas anteriorly. We have been using silver alginate, the wound area makes any other type of dressing difficult simply because of the volume of open areas. His nephew changes his wrap and per our intake nurse he does a good job. He does not have compression pumps. 01/10/18; chronic venous insufficiency with inflammation and lymphedema. He has wounds right along the right lateral calf. Also wounds on the upper abdomen under the pannus and the anterior right thigh. We've been using Fibracol. He has had some improvement in the superior part of the leg wound. He uses 4 layer compression that his nephew puts on him and he apparently doesn't good job. He received a phone  call to say that his insurance would not cover compression pumps 02/07/18; chronic venous insufficiency with inflammation and lymphedema. He is not eligible for compression pumps through his insurance. We are using 4 layer compression. He has had some improvement in the inferior part of the large wound on his left lateral leg but some deterioration in the superior aspect. There was some drainage coming out of this. I didn't see any evidence of infection 03/07/18; chronic venous insufficiency with inflammation and lymphedema. We now think there may be an avenue to get compression pumps covered through his insurance. We are using 4 layer compression. We are using Furber call to the large wound area which I think are looking some better in the dimensions are better. Some of the smaller wounds on the anterior right leg healed. The area between the large abdominal pannus and the anterior right thigh is worse. The wound on the thigh is worse. There is surrounding fungal dermatitis which is probably Candida intertrigo. The areas reasonably substantial 04/18/18; this patient I have not seen in about a month and a half. He tells me since he is been here he had fairly extensive cellulitis involving his lower abdominal pannus and anterior right thigh. Wounds in this area are therefore today quite a bit worse. He also has had worsening of the large wounds along his lateral right calf which as I remember things were quite a bit better 6 weeks ago. He does have his lymphedema pumps apparently on approval status but still not delivered to his home. We have been using Fibracol. Limited amount of dressings we can use secondary to the substantial overall wound area 05/16/2018; monthly follow-up. The patient has obtained his external compression pumps and use them 4 times. There is a remarkable improvement in the amount of edema in the right lower leg and some improvement in the generalized erythema especially on the  right lateral calf. However he continues to have a substantial  wound area basically from just around the lateral malleolus up to his fibular head. He seems to tolerate this remarkably well. We have been using purachol 06/13/2018 monthly follow-up. Patient states he uses his compression pumps once a day sometimes twice. He has a lot of improvement in the edema but still a colossal full-thickness wound on the right lateral calf all the way from the ankle to just below the knee. ooShe also has a new area medially in the lower abdominal pannus ooWe have been using silver alginate on the abdominal pannus/thigh wounds and pure call on the right lateral calf. He uses ABD pads to try and push the bulging muscle back into the calf area I certainly encourage this 07/04/2018; patient is using compression pumps at home. A lot of improvement in the edema but still a colossal full-thickness wound on the right lateral calf from the ankle to the knee. This has exposed muscle. ooShe has 3 areas on the pannus of his abdomen to on the abdominal side and one on the right anterior thigh we have been using silver alginate 08/01/2018. Large wound on the right lateral calf and ankle. Things look actually stable here. Surface is stable he has some epithelialization. Edema control is adequate using compression pumps 1-2 times per day ooIn the large abdominal folds he still has the area on the anterior thigh he has 3 wounds on the pannus we are using silver alginate 08/30/2018; the large wound on his right lateral calf and ankle. Miraculously looks some better. He has large rims of skin coming inferiorly distally and more proximally superiorly. This really looks like it is doing well. Macerated wounds on his pannus all about the same. There are 3 wounds 2 on the thigh one on the abdomen. Areas moist. They have been using silver alginate 2/7/; large anterior calf wounds on the right lateral calf and ankle. He has been  using Fibracol. Arrives today with a very adherent necrotic surface that I could not wash off with Anasept. No change in the pannus wounds using silver alginate here 3/6; large wounds on the right lateral calf and ankle. Actually has a better looking surface. Furthermore some epithelialization. This is a very large wound but appears to be making some progress there using silver alginate He has 3 wounds. In the abdominal pannus against his thigh one on the lower abdomen, one right in the inguinal area and one on the anterior thigh. These areas are wet and macerated. Simply too much pressure here 4/13; telehealth visit out of concerns for the Covid epidemic. We did not have clear images. The patient is using silver alginate but he ran out of this and is using Hydrofera Blue on his leg and silver alginate on the wounds on his thigh and pannus area on the right. 5/7; some improvement in the right lateral leg wound using silver alginate however he has 2 large areas on the lower abdominal pannus one on the right anterior thigh that really are larger and remain in constantly warm macerated situation. 6/12; the area on the right lateral leg perhaps somewhat less in terms of width but the entire wound surface is covered with a very adherent debris which I think inhibits any degree of epithelialization. We have been using silver alginate to this large wound area, there is not much else to choose from. We did try Fibracol for a while. This did not really help. ooHe has the areas on the lower abdominal pannus and upper thigh we  have simply been using drawtex to this and things look somewhat better today. 7/27; no change in the right lateral leg wound covered in very adherent surface slough. The areas on the abdomen and pannus and upper thigh on the right actually look better. We have been using silver alginate to all wounds. 8/27; right lateral leg wound is 100% slough covered very adherent. The area on  the abdomen and pannus upper thigh all look better. We have been using just drawtex on these areas. He is using his external compression pumps at home 10/15; right lateral leg wound is 100% slough covered very adherent. We have been using Mehdi honey. This is inexpensive somewhat antibacterial. It does not seem to be making any headway as with the amount of eschar here. I can no longer debride him in this clinic. There is simply too much bleeding. He is not on blood thinners. I suspect this may be severe venous hypertension related to cor pulmonale/obesity hypoventilation syndrome 08/28/2019 TELEHEALTH visit. I saw this patient via telehealth visit today. I am accompanied by her clinical nurse and the patient had his care attendant who is his nephew. We have been using Medihoney with alginate to the large area on the right leg he is also using Medihoney to the pannus wounds. These have been going really quite well according to the nephew. Allergies No Known Drug Allergies Objective Constitutional Sitting or standing Blood Pressure is within target range for patient.. Pulse regular and within target range for patient.Marland Kitchen Respirations regular, non-labored and within target range.. Temperature is normal and within the target range for the patient.Marland Kitchen Appears in no distress. General Notes: Wound exam ooThe extensive area on the right lateral calf actually looks smaller with a better looking wound surface. ooPannus wounds including x2 on the abdominal area and x1 on the anterior thigh also look improved. Integumentary (Hair, Skin) Wound #14 status is Open. Original cause of wound was Gradually Appeared. The wound is located on the Right Abdomen - Lower Quadrant. The wound measures 1cm length x 3cm width x 0.1cm depth; 2.356cm^2 area and 0.236cm^3 volume. There is Fat Layer (Subcutaneous Tissue) Exposed exposed. There is no tunneling or undermining noted. There is a large amount of serosanguineous  drainage noted. The wound margin is epibole. There is large (67-100%) pink, pale granulation within the wound bed. There is no necrotic tissue within the wound bed. Wound #28 status is Open. Original cause of wound was Gradually Appeared. The wound is located on the Right Upper Leg. The wound measures 6cm length x 5cm width x 0.1cm depth; 23.562cm^2 area and 2.356cm^3 volume. There is Fat Layer (Subcutaneous Tissue) Exposed exposed. There is no tunneling or undermining noted. There is a large amount of serosanguineous drainage noted. The wound margin is flat and intact. There is large (67-100%) red granulation within the wound bed. There is no necrotic tissue within the wound bed. Wound #30 status is Open. Original cause of wound was Gradually Appeared. The wound is located on the Right,Medial Abdomen - Lower Quadrant. The wound measures 8cm length x 5cm width x 0.1cm depth; 31.416cm^2 area and 3.142cm^3 volume. There is Fat Layer (Subcutaneous Tissue) Exposed exposed. There is no tunneling or undermining noted. There is a large amount of serosanguineous drainage noted. The wound margin is flat and intact. There is large (67-100%) red granulation within the wound bed. There is no necrotic tissue within the wound bed. Wound #9 status is Open. Original cause of wound was Gradually Appeared. The wound  is located on the Right,Lateral Lower Leg. The wound measures 37cm length x 22cm width x 0.3cm depth; 639.314cm^2 area and 191.794cm^3 volume. 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 - Telehealth. Dressing Change Frequency: Change Dressing every other day. - all wounds Skin Barriers/Peri-Wound Care: Wound #14 Right Abdomen - Lower Quadrant: Antifungal powder - nystatin powder to abdominal skin folds Barrier cream - to periwound. Wound #30 Right,Medial Abdomen - Lower Quadrant: Barrier cream - to periwound. Wound #9 Right,Lateral Lower Leg: Barrier cream Moisturizing lotion Wound Cleansing: Wound #9 Right,Lateral Lower Leg: May shower with protection. - do not get right leg wet Other: - Clean all wounds with gauze and Anasept Primary Wound Dressing: Wound #14 Right Abdomen - Lower Quadrant: Other: - drawtex cut to fit wound bed margins. Wound #28 Right Upper Leg: Other: - drawtex cut to fit wound bed margins. Wound #30 Right,Medial Abdomen - Lower Quadrant: Other: - drawtex cut to fit wound bed margins. Wound #9 Right,Lateral Lower Leg: Medihoney Alginate - sheets apply to wound bed. Secondary Dressing: Wound #14 Right Abdomen - Lower Quadrant: ABD pad Wound #28 Right Upper Leg: ABD pad Wound #30 Right,Medial Abdomen - Lower Quadrant: ABD pad Wound #9 Right,Lateral Lower Leg: ABD pad - Roll and layer ABDs under compression wrap to apply pressure to muscle. Kerramax - or Zetuvit Edema Control: 4 layer compression - Right Lower Extremity Avoid standing for long periods of time Elevate legs to the level of the heart or above for 30 minutes daily and/or when sitting, a frequency of: - throughout the day Segmental Compressive Device. - Lymphedema pumps twice a day for 1 hour each 1. Medihoney alginate sheets 2. Drawtex 3. ABDs/4-layer compression 4. Same dressing to the abdominal wall Electronic Signature(s) Signed: 08/29/2019 4:55:01 AM By: Linton Ham MD Entered By: Linton Ham on 08/28/2019  15:40:35 -------------------------------------------------------------------------------- SuperBill Details Patient Name: Date of Service: [name] 08/28/2019 Medical Record [id number] Patient Account Number: [id number] Date of Birth/Sex: Treating RN: September 18, 1960 (59 y.o. 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 I89.0 Lymphedema, not elsewhere classified E66.01 Morbid (severe) obesity due to excess calories L97.112 Non-pressure chronic ulcer of right thigh with fat layer exposed Physician Procedures CPT4 Description: Code [id number] - WC PHYS LEVEL 2 - EST PT ICD-10 Diagnosis Description L97.213 Non-pressure chronic ulcer of right calf with necrosis of S31.103D Unspecified open wound of abdominal wall, right lower quad into peritoneal cavity,  subsequent encounter I87.331 Chronic venous hypertension (idiopathic) with ulcer and in extremity I89.0 Lymphedema, not elsewhere classified Modifier: muscle rant without pe flammation of r Quantity: 1 netration ight lower Electronic Signature(s) Signed: 08/29/2019 4:55:01 AM By: Dellia Nims,  Legrand Como MD Entered By: Linton Ham on 08/28/2019 15:41:13

## 2019-09-09 ENCOUNTER — Telehealth: Payer: Self-pay | Admitting: Orthopaedic Surgery

## 2019-09-09 DIAGNOSIS — M25561 Pain in right knee: Secondary | ICD-10-CM

## 2019-09-09 DIAGNOSIS — M25562 Pain in left knee: Secondary | ICD-10-CM

## 2019-09-09 DIAGNOSIS — G8929 Other chronic pain: Secondary | ICD-10-CM

## 2019-09-09 MED ORDER — OXYCODONE HCL 15 MG PO TABS: ORAL_TABLET | ORAL | 0 refills | Status: DC

## 2019-09-09 NOTE — Telephone Encounter (Signed)
Patient requests refill on Oxycodone 15 mgs.  Qty  160  Sig: One tablet every four to six hours for pain. Must last 30 days.  Patient states he uses [facility] in [place]

## 2019-09-25 ENCOUNTER — Other Ambulatory Visit: Payer: Self-pay

## 2019-09-25 ENCOUNTER — Ambulatory Visit: Payer: Medicare HMO | Admitting: Orthopaedic Surgery

## 2019-09-25 ENCOUNTER — Encounter: Payer: Self-pay | Admitting: Orthopaedic Surgery

## 2019-09-25 ENCOUNTER — Encounter (HOSPITAL_BASED_OUTPATIENT_CLINIC_OR_DEPARTMENT_OTHER): Payer: Medicare HMO | Attending: Internal Medicine | Admitting: Internal Medicine

## 2019-09-25 DIAGNOSIS — M25562 Pain in left knee: Secondary | ICD-10-CM

## 2019-09-25 DIAGNOSIS — G8929 Other chronic pain: Secondary | ICD-10-CM

## 2019-09-25 DIAGNOSIS — M25561 Pain in right knee: Secondary | ICD-10-CM | POA: Diagnosis not present

## 2019-09-25 DIAGNOSIS — Z6841 Body Mass Index (BMI) 40.0 and over, adult: Secondary | ICD-10-CM

## 2019-09-25 NOTE — Progress Notes (Signed)
[name], Dwayne ([id number]) Visit Report for 09/25/2019 HPI Details Patient Name: Date of Service: [name] 09/25/2019 1:30 PM Medical Record [id number] Patient Account Number: [id number] Date of Birth/Sex: Treating RN: Sep 24, 1960 (59 y.o. M) Primary Care Provider: Margarita Rana Other Clinician: Referring Provider: Treating Provider/Extender:Kelby Lotspeich, Stefan Church, Elon Alas in Treatment: 48 History of Present Illness HPI Description: this is a patient with morbid obesity with a BMI over 70. He tells me that he hit his right ankle on part of the car in November 2015. He developed an MRSA infection in t.his man is been on and off anti-biotics ever since only recently finishing on Septra.he has also been on Keflex. He has a history of chronic venous ulcerations in his right lower leg and probably some component of the lymphedema. I don't see any recent cultures of this area nor do I think he is had x-rays that I am aware of. He also has a problem with recurrent wounds under his large abdominal pannus. The patient and his sister who accompanies him already seemed well versed in how to care for these. They already have interdry at home and applying nystatin powder. They are able usually to get these to close however there are 2 wounds here 1 on the abdominal pannus and one on his upper right thigh that are not closing. he also has had recurrent cellulitis in this area. 03/05/15; his wounds have not changed much from last week. All wonder underwent a surface debridement. The area on the right lateral malleolus a little more aggressively. There is no evidence of infection. The his sister reports no supplies came i.e. no so over alginate [[employer]]. 03/12/15 I think the wounds in both abdominal and on the anterior thigh looks somewhat better. no debridement was done today. The area on the right lateral malleolus underwent an aggressive debridement with a curette to  remove surface slough and underlying fibrinous exudate. We used a curette for this 03/19/15. Both of the wounds in his abdominal pannus and anterior thigh were selectively debridement with a curette. The area on the right lateral malleolus again underwent an aggressive debridement. This is a deep wound in proximity to the underlying malleolus. It appears that there is drainage here and underlying maceration. 04/09/15. Both wounds in the abdominal pannus and anterior thigh appears smaller with clean granulated bases. The area over the right lateral malleolus again required a selective debridement. This is a deep wound. There is no drainage and no evidence of infection that is obvious 9/2/16both his wound area in the abdominal pannus and anterior thigh are considerably better. The area over the right lateral malleolus is filled in with granulation tissue. No debridement was required in either area. 05/07/15; both his wound areas in the abdominal pannus and anterior right thigh continued to gradually improve with advancing epithelialization. There are no options here other than some over alginate aggressive cleaning and drying and offloading which they're doing with a rolled towel.. The area on the right lateral malleolus is also considerably better 05/28/15; the area on the right lateral malleolus is fully epithelialized and requires nothing but a protective foam dressing. The area on the abdominal pannus on the right and the right anterior thigh also continued to regress in size with impressive degrees of surrounding epithelialization 09/30/15; this is a man who has morbid obesity, chronic venous insufficiency and lymphedema. He also had wounds under his large abdominal pannus on the right side. We care for him through the summer  and fall of 2016. According to his wife they did not return because the wound over the right lateral malleolus and is pannus wounds had healed. Therefore we did not really  provide ongoing compression. They tell me that over the last 4-5 weeks he has redeveloped the wounds on the pannus both on the right lower quadrant of his abdomen and the right upper thigh. More worrisome to them wounds on the right calf in 2 areas anterolateral and a large area anteromedially. They have been using what they had left over from last time including silver alginate, a piece of Hydrofera Blue and apparently Unna boots although I don't remember ordering the latter for them. 10/07/15; the area on the right medial ankle is improved with advancing epithelialization although there is still some open areas and some macerated tissue at risk below the open areas here. I am less concerned about this than I was last week. The area under his pannus on his right thigh is stable and smaller. The area on the abdominal pannus itself is unchanged and was debridement of a gritty surface slough 10/14/15 the area on the right medial leg is still inflamed, there is some maceration. We changed to [facility] Blue last week and I don't think that this is contact dermatitis from [facility] but I feel we should change to something with more absorptive properties. The area on the thigh under the pannus is considerably improved while the abdominal wound is largely unchanged 10/22/15; the patient has had some deterioration in the lower leg wounds with considerable poorly controlled edema in the right leg. There wounds on the lateral and medial aspect of. None of these look to be infected. The pannus wounds are improved on the thigh not improved on the abdomen 04/28/16; patient is been coming here for the last month although I've managed to miss him every Friday. He tells me he developed a wound on his right lateral leg 5 or 6 weeks ago. He saw it in the mirror in the bathroom. no obvious etiology Is using Santyl to this. He also has another wound on the lower abdominal pannus 05/05/16; no obvious change to the wound  on the right lateral leg. Continue using Santyl. Area on his abdominal pannus appears to be improved silver alginate 05/12/16; the right leg wound has more exposed healthy granulation. We seemed to be making some progress. They're using Santyl at home and his wife is changing the dressing including applying an Haematologist. The area on his abdominal pannus appears stable to improved 05/19/16; the right leg has more exposed healthy granulation. Still tightly adherent nonviable tissue on the circumference the requires debridement. We have been using Santyl at home. The area on his abdominal pannus can tenuous to improve 06/09/16; haven't seen this patient in 3 weeks he has been using Santyl at home due to a wound on the right lateral calf. He has chronic venous insufficiency as the presumed etiology. His wife changes the dressings including the lunar wraps. He reappeared here I think in August with this wound. Unfortunately dimensions are worse by roughly 3 x 1 cm today. A lot of necrotic tissue around the circumference of this wound nothing here looks healthy. This is not overtly infected. His pannus wound on the right looks better and smaller 06/16/16; the patient's wound is longer but narrower although the base of this as less adherent slough the changing pattern of this may be feel that this would need to be biopsied and I  went ahead and did this today. His pannus wound does not look much better and there is now a mirror image opening on his anterior thigh on the right. We had been using Iodoflex 06/22/16; biopsy I did last week was negative for malignancy findings suggestive of a stasis ulcer his sister is changing the dressing 1 time with an Unna wrap that she has supplies at home. 06/29/16; wound is somewhat improved on right leg but still a large wound. Both wounds on right pannus on right leg both required debridement. 07/06/16 large wound on his lateral right leg. Most of the surface of this is  much improved versus a month ago at which time he had severe adherent surface slough. The auto flexes help with this. He also has 2 wounds one on his right lower abdominal pannus and the upper part of his right thigh. These are probably friction area wounds 07/20/16; this man has a large venous insufficiency wound on the lateral right leg. Biopsy previously negative. Over the last month we managed to get the surface of this down to something that looks healthy. He also has 2 wounds on his right lower abdominal pannus and the anterior right thigh underneath it. Using silver alginate here 07/28/16; patient had a fall yesterday hurting his left ankle therefore he missed his appointment. X-rays in the ER were negative. Having some discomfort. We have been using silver alginate to the areas on his right lateral calf and also the pannus wounds 08/04/16; large wound on the lateral aspect of his right calf in the setting of severe chronic venous insufficiency and inflammation. We finally have this down to a reasonably healthy base although debridement was still required today. We have been using Hydrofera Blue 08/18/16; large wound on the lateral aspect of his right calf in the setting of chronic venous insufficiency. Measures smaller in terms of with length is about the same base of this wound looks stable although there is still protruding granulation. This suggests more pressure is required. He has 2 new wounds inferiorly which she says are from traumatizing the leg against the bed frame. Has been using silver alginate to the pannus and thigh wounds. Hydrofera Blue to the major wounds on the right 09/01/16 substantial wound on the lateral aspect of his right calf just below the knee in the setting of chronic venous hypertension and lymphedema. This measures slightly smaller although this is a substantial wound. He has been using Hydrofera Blue here. 2 wounds involving the right pannus and right anterior  thigh using silver alginate 09/15/16; substantial wound on the lateral aspect of his right calf just below the knee in the setting of severe chronic venous hypertension and lymphedema. He has a substantial area of epithelialization here superiorly however in the middle of it or to open areas when I was questioning him about this his sister states that he hit this on the bed frame and he admits to doing so. Other than at the wound itself is substantial but appears to be healthy. He has 2 small open areas below the wound which apparently were also secondary to trauma on the bed frame. Finally he has the areas on the right abdominal pannus, right anterior thigh 09/28/16; substantial wound on the lateral aspect of his right calf just below the knee in the setting of chronic venous hypertension and lymphedema. He has done well in this wound is better certainly narrower with a reasonably healthy granulation. He continues to have 2 small open areas  on the right abdominal pannus in the right anterior thigh and a G0 largely friction type wounds from the 2 surfaces. These have largely Stolled we have been using Hydrofera Blue on the right leg and silver alginate long-standing on the pannus wounds also on the right 10/13/16; substantial wound on the lateral aspect of his right Is definitely down in dimensions. This is the setting of chronic venous hypertension and lymphedema. He has some secondary lesions inferiorly which I think we are measuring overall wound area. We have been using Hydrofera Blue under 4-layer compression. 10/27/16; slightly smaller in terms of wound dimensions. Visually the wound looks stable, I was looking for some improvement in the overall dimensions beyond what we actually have. I'm going to give this another 2 weeks with an unchanged dressing however if there are not more impressive changes I'll consider a substantial wound debridement 11/09/16; no major changes in the condition of the  wound on the left lateral calf. This time however covered in a tightly adherent necrotic surface. No surrounding infection. The wounds in his pannus slightly smaller the area on the right anterior thigh slightly larger although the patient states with the death of his sister he has been up a lot more. He has not been systemically unwell. The edema in the leg looks reasonably well controlled 11/24/16-he is here for follow-up evaluation. He continues to have a significant lesion in the lateral aspect of his leg. He admits he is aware that this is exacerbated by pressure but has been unable to find a way to consistently or effectively offload this. He has voiced seeing no complaints or concerns 12/08/16; here for follow-up evaluation significant wound on the right lateral leg still to refractory areas in the right lower abdominal pannus and is on his right thigh. He arrives today with fairly good edema control 12/21/16; patient is here for follow-up of an extensive wound on the right lateral leg below the knee as well as the right lower abdominal pannus and right upper thigh wound. We have been using Hydrofera Blue to the right leg wound and silver alginate with interdry to the pannus wounds which appear to be making good progress. 01/05/17; extensive area on the right lateral leg below the knee. No major change in this. Thick adherent necrotic surface. We have been using Hydrofera Blue without apparent improvement. He also has wounds on his pannus both on the abdomen and the thigh not much change from last time we have been using silver alginate here 01/19/17- He is here for follow up evaluation. He states that his glucose levels have been fluctuating between 150-200; this morning fasting level 162. He is voicing no concerns, complaints, no fever/chills. 02/02/17; as far as his wounds are concerned things are not going particularly well. He has a new wound in the pannus medially to the original wound. He  arrives today with this area moist malodorous. The area on the right lateral leg some improved although he still has a deeper satellite area underneath the original wound. 02/16/17. 2 week visit using Iodoflex on the leg wounds and silver alginate on his pannus wounds. 03/09/17; haven't seen this man in about 3 weeks. His abdominal/upper thigh pannus wounds look a lot better using silver alginate right lateral leg wound perhaps some visual improvement in terms of dimensions but the dimensions and measurements or not that different. 04/27/17; seen by Dr. Con Memos while I was oriented vacation early last month. Using [facility] with continued improvement in the conditions  of the wound bed. He is using silver alginate to the wounds in the abdominal/thigh pannus 06/01/17; patient has not been seen here in over a month. He is been using Hydrofera Blue with continued improvement in the conditions of the wound bed on the right lateral leg. He uses silver alginate to the abdominal/thigh pannus area with inter-dry and rolled gauze 06/15/17; two-week follow-up appointment. Large wound on the right lateral leg predominantly venous insufficiency. He has a smaller satellite lesion under that wound. These have been making progress with [facility] He has to pannus wounds one on the lower abdomen one on the upper right thigh. 06/29/17; 2 week follow-up appointment. He tells me that he had cellulitis on the lower abdominal pannus/lymphedema. This was treated by phone with phone and antibiotics by his primary physician using Keflex 500 mg by mouth every 6. Large wound on the right lateral thigh we've been using Hydrofera Blue. This is venous insufficiency wound. Smaller lesion under the wound. We have much better surfaces with the Hydrofera Blue Pannus wounds on the abdominal and thigh on the right 07/27/17; with regards to his right lateral leg he has a "Coke bottle" shaped wound on the left lateral leg this  appears to be somewhat better perhaps slightly smaller, certainly a better-looking surface. He has satellite wounds underneath this with adherent necrotic debris and a new small wound laterally. He also has 4 small open areas anteriorly on the left leg which may be a wrap injury or scissor injury His pannus wounds are really about the same. he has Interdry. However I don't believe this is offloading this enough or keeping this dry enough 08/10/17; not much change in any of these wounds perhaps slightly better and the major wounds superiorly and slightly worse in the satellite lesion inferior to the major wound. He also has a second satellite lesion medially. We have been using Hydrofera Blue He tells me he is being more aggressive offloading the wounds on the right pannus both in the lower abdomen and anterior thigh seen silver alginate here 08/24/17; lateral right leg wound looks about the same a bit disappointing given the aggressive debridement I did 2 weeks ago. The satellite lesion underneath this is worse and requires debridement He has the usual wounds on the inferior part of his pannus and the underlying thigh however deep in the recess there is new open areas here 3 measured as a small cluster 09/07/17; lateral right leg wound had some necrotic debris in the mid aspect. Our intake nurse noted what appears to be striated muscle although I am not exactly sure of this after this visit. It did not move with flexion and extension. I been assuming that this is a a venous ulcer associated with venous inflammation and secondary lymphedema. He also has an enlarging wound which was the satellite lesion underneath this. Finally he has the area under his abdominal pannus and the area of his thigh under the abdominal pannus on the right. We have been using silver alginate 09/28/17; the lateral right leg bottle shaped wound is about the same. What started as a small satellite lesion below this is rapidly  expanded and I biopsied this. I had previously biopsied the large wound itself in late 2017. His pannus wounds look better. We have use silver alginate for a long period 10/12/17; the lateral right leg bottle-shaped wounds superiorly is about the samestill adherent and slough covered.. The satellite area inferiorly has copious amounts of necrotic material. Culture I  did last time showed Pseudomonassensitive to ciprofloxacin and he is finishing this now. Tolerating it well. Biopsy I did did not show any malignancy or other abnormalities. Suggestive of a chronic venous ulcer 10/25/17; the lateral right leg bottle shaped wound continues to be about the same. At the bottom this is beginning to cold less with what was a satellite lesion underneath it I suspect both of these will merge at some point. He has what appears to be protruding muscle through the middle part of the wound. I've been attempting to put enough compression on this area to counteract that. He has lymphedema and I wonder about compression pumps. He has [employer] 11/22/17; chronic venous insufficiency with inflammation and lymphedema. The area on the right calf is original wound is may be slightly smaller however the wounds have now continued down his leg and now has a new one on the lateral malleolus and 2 new areas on the anterior calf. Looking through his records I don't see arterial insufficiency although his peripheral pulses are palpable. He also has wounds under the abdominal pannus and on the front of his right thigh. We are using silver alginate absorbent dressings in this area 12/20/17; chronic venous insufficiency with inflammation and lymphedema. The patient has 3 separate wounds on the right lateral calf. This goes from just below his knee down to the right lateral malleolus. He has a new wound medially on the right calf. Several excoriated areas anteriorly. We have been using silver alginate, the wound area makes any  other type of dressing difficult simply because of the volume of open areas. His nephew changes his wrap and per our intake nurse he does a good job. He does not have compression pumps. 01/10/18; chronic venous insufficiency with inflammation and lymphedema. He has wounds right along the right lateral calf. Also wounds on the upper abdomen under the pannus and the anterior right thigh. We've been using Fibracol. He has had some improvement in the superior part of the leg wound. He uses 4 layer compression that his nephew puts on him and he apparently doesn't good job. He received a phone call to say that his insurance would not cover compression pumps 02/07/18; chronic venous insufficiency with inflammation and lymphedema. He is not eligible for compression pumps through his insurance. We are using 4 layer compression. He has had some improvement in the inferior part of the large wound on his left lateral leg but some deterioration in the superior aspect. There was some drainage coming out of this. I didn't see any evidence of infection 03/07/18; chronic venous insufficiency with inflammation and lymphedema. We now think there may be an avenue to get compression pumps covered through his insurance. We are using 4 layer compression. We are using Furber call to the large wound area which I think are looking some better in the dimensions are better. Some of the smaller wounds on the anterior right leg healed. The area between the large abdominal pannus and the anterior right thigh is worse. The wound on the thigh is worse. There is surrounding fungal dermatitis which is probably Candida intertrigo. The areas reasonably substantial 04/18/18; this patient I have not seen in about a month and a half. He tells me since he is been here he had fairly extensive cellulitis involving his lower abdominal pannus and anterior right thigh. Wounds in this area are therefore today quite a bit worse. He also has had  worsening of the large wounds along his lateral right  calf which as I remember things were quite a bit better 6 weeks ago. He does have his lymphedema pumps apparently on approval status but still not delivered to his home. We have been using Fibracol. Limited amount of dressings we can use secondary to the substantial overall wound area 05/16/2018; monthly follow-up. The patient has obtained his external compression pumps and use them 4 times. There is a remarkable improvement in the amount of edema in the right lower leg and some improvement in the generalized erythema especially on the right lateral calf. However he continues to have a substantial wound area basically from just around the lateral malleolus up to his fibular head. He seems to tolerate this remarkably well. We have been using purachol 06/13/2018 monthly follow-up. Patient states he uses his compression pumps once a day sometimes twice. He has a lot of improvement in the edema but still a colossal full-thickness wound on the right lateral calf all the way from the ankle to just below the knee. She also has a new area medially in the lower abdominal pannus We have been using silver alginate on the abdominal pannus/thigh wounds and pure call on the right lateral calf. He uses ABD pads to try and push the bulging muscle back into the calf area I certainly encourage this 07/04/2018; patient is using compression pumps at home. A lot of improvement in the edema but still a colossal full-thickness wound on the right lateral calf from the ankle to the knee. This has exposed muscle. She has 3 areas on the pannus of his abdomen to on the abdominal side and one on the right anterior thigh we have been using silver alginate 08/01/2018. Large wound on the right lateral calf and ankle. Things look actually stable here. Surface is stable he has some epithelialization. Edema control is adequate using compression pumps 1-2 times per day In the  large abdominal folds he still has the area on the anterior thigh he has 3 wounds on the pannus we are using silver alginate 08/30/2018; the large wound on his right lateral calf and ankle. Miraculously looks some better. He has large rims of skin coming inferiorly distally and more proximally superiorly. This really looks like it is doing well. Macerated wounds on his pannus all about the same. There are 3 wounds 2 on the thigh one on the abdomen. Areas moist. They have been using silver alginate 2/7/; large anterior calf wounds on the right lateral calf and ankle. He has been using Fibracol. Arrives today with a very adherent necrotic surface that I could not wash off with Anasept. No change in the pannus wounds using silver alginate here 3/6; large wounds on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 lesion medially. We have been using [facility] tells me he is being more aggressive offloading the wounds on the right pannus both in the lower abdomen and anterior thigh seen silver alginate  here 08/24/17; lateral right leg wound looks about the same a bit disappointing given the aggressive debridement I did 2 weeks ago. The satellite lesion underneath this is worse and requires debridement ooHe has the usual wounds on the inferior part of his pannus and the underlying thigh however deep in the recess there is new open areas here o3 measured as a small cluster 09/07/17; lateral right leg wound had some necrotic debris in the mid aspect. Our intake nurse noted what appears to be striated muscle although I am not exactly sure of this after this visit. It did not move with flexion and extension. I been assuming that this is a a venous ulcer associated with venous inflammation and secondary lymphedema. He also has an enlarging wound which was the satellite lesion underneath this. Finally he has the area under his abdominal pannus and the area of his thigh under the abdominal pannus on the right. We have been using silver alginate 09/28/17; the lateral right leg bottle shaped wound is about the same. What started as a small satellite lesion below this is rapidly expanded and I biopsied this. I had previously biopsied the large wound itself in late 2017. His pannus wounds look better. We have use silver alginate for a long period 10/12/17; the lateral right leg bottle-shaped wounds superiorly is about the samestill adherent and slough covered.. The satellite area inferiorly has copious amounts of necrotic material. Culture I did last time showed Pseudomonassensitive to ciprofloxacin and he is finishing this now. Tolerating it well. Biopsy I did did not show any malignancy or other abnormalities. Suggestive of a chronic venous ulcer 10/25/17; the lateral right leg bottle shaped wound continues to be about the same. At the bottom this is beginning to cold less with what was a satellite lesion underneath it I suspect both of these will merge at some point. He has what appears to be protruding  muscle through the middle part of the wound. I've been attempting to put enough compression on this area to counteract that. He has lymphedema and I wonder about compression pumps. 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 The area on the right calf is original wound is may be slightly smaller however the wounds have now continued down his leg and now has a new one on the lateral malleolus and 2 new areas on the anterior calf. Looking through his records I don't see arterial insufficiency although his peripheral pulses are palpable. He also has wounds under the abdominal pannus and on the front of his right thigh. We are using silver alginate absorbent dressings in this area 12/20/17; chronic venous insufficiency with inflammation and lymphedema. The patient has 3 separate wounds on the right lateral calf. This goes from just below his knee down to the right lateral malleolus. He has a new wound medially on the right calf. Several excoriated areas anteriorly. We have been using silver alginate, the wound area makes any other type of dressing difficult simply because of the volume of open areas. His nephew changes his wrap and per our intake nurse he does a good job. He does not have compression pumps. 01/10/18; chronic venous insufficiency with inflammation and lymphedema. He has wounds right along the right lateral calf. Also wounds on the upper abdomen under the pannus and the anterior right thigh. We've been using Fibracol. He has had some improvement in the superior part of the leg wound. He uses 4 layer compression that his nephew puts on him and he apparently doesn't good job. He received a phone call to say that his insurance would not cover compression pumps 02/07/18; chronic venous insufficiency with inflammation and lymphedema. He is not eligible for compression pumps through his insurance. We are using 4 layer compression. He has had  some improvement in the inferior part of the large wound on his left lateral leg but some deterioration in the superior aspect. There was some drainage coming out of this. I didn't see any evidence of infection 03/07/18; chronic venous insufficiency with inflammation and lymphedema. We now think there may be an avenue to get compression pumps covered through his insurance. We are using 4 layer compression. We are using Furber call to the large wound area which I think are looking some better in the dimensions are better. Some of the smaller wounds on the anterior right leg healed. The area between the large abdominal pannus and the anterior right thigh is worse. The wound on the thigh is worse. There is surrounding fungal dermatitis which is probably Candida intertrigo. The areas reasonably substantial 04/18/18; this patient I have not seen in about a month and a half. He tells me since he is been here he had fairly extensive cellulitis involving his lower abdominal pannus and anterior right thigh. Wounds in this area are therefore today quite a bit worse. He also has had worsening of the large wounds along his lateral right calf which as I remember things were quite a bit better 6 weeks ago. He does have his lymphedema pumps apparently on approval status but still not delivered to his home. We have been using Fibracol. Limited amount of dressings we can use secondary to the substantial overall wound area 05/16/2018; monthly follow-up. The patient has obtained his external compression pumps and use them 4 times. There is a remarkable improvement in the amount of edema in the right lower leg and some improvement in the generalized erythema especially on the right lateral calf. However he continues to have a substantial wound area basically from just around the lateral malleolus up to his fibular head. He seems to  tolerate this remarkably well. We have been using purachol 06/13/2018 monthly follow-up.  Patient states he uses his compression pumps once a day sometimes twice. He has a lot of improvement in the edema but still a colossal full-thickness wound on the right lateral calf all the way from the ankle to just below the knee. ooShe also has a new area medially in the lower abdominal pannus ooWe have been using silver alginate on the abdominal pannus/thigh wounds and pure call on the right lateral calf. He uses ABD pads to try and push the bulging muscle back into the calf area I certainly encourage this 07/04/2018; patient is using compression pumps at home. A lot of improvement in the edema but still a colossal full-thickness wound on the right lateral calf from the ankle to the knee. This has exposed muscle. ooShe has 3 areas on the pannus of his abdomen to on the abdominal side and one on the right anterior thigh we have been using silver alginate 08/01/2018. Large wound on the right lateral calf and ankle. Things look actually stable here. Surface is stable he has some epithelialization. Edema control is adequate using compression pumps 1-2 times per day ooIn the large abdominal folds he still has the area on the anterior thigh he has 3 wounds on the pannus we are using silver alginate 08/30/2018; the large wound on his right lateral calf and ankle. Miraculously looks some better. He has large rims of skin coming inferiorly distally and more proximally superiorly. This really looks like it is doing well. Macerated wounds on his pannus all about the same. There are 3 wounds 2 on the thigh one on the abdomen. Areas moist. They have been using silver alginate 2/7/; large anterior calf wounds on the right lateral calf and ankle. He has been using Fibracol. Arrives today with a very adherent necrotic surface that I could not wash off with Anasept. No change in the pannus wounds using silver alginate here 3/6; large wounds on the right lateral calf and ankle. Actually has a better  looking surface. Furthermore some epithelialization. This is a very large wound but appears to be making some progress there using silver alginate He has 3 wounds. In the abdominal pannus against his thigh one on the lower abdomen, one right in the inguinal area and one on the anterior thigh. These areas are wet and macerated. Simply too much pressure here 4/13; telehealth visit out of concerns for the Covid epidemic. We did not have clear images. The patient is using silver alginate but he ran out of this and is using Hydrofera Blue on his leg and silver alginate on the wounds on his thigh and pannus area on the right. 5/7; some improvement in the right lateral leg wound using silver alginate however he has 2 large areas on the lower abdominal pannus one on the right anterior thigh that really are larger and remain in constantly warm macerated situation. 6/12; the area on the right lateral leg perhaps somewhat less in terms of width but the entire wound surface is covered with a very adherent debris which I think inhibits any degree of epithelialization. We have been using silver alginate to this large wound area, there is not much else to choose from. We did try Fibracol for a while. This did not really help. ooHe has the areas on the lower abdominal pannus and upper thigh we have simply been using drawtex to this and things look somewhat better today. 7/27; no change  in the right lateral leg wound covered in very adherent surface slough. The areas on the abdomen and pannus and upper thigh on the right actually look better. We have been using silver alginate to all wounds. 8/27; right lateral leg wound is 100% slough covered very adherent. The area on the abdomen and pannus upper thigh all look better. We have been using just drawtex on these areas. He is using his external compression pumps at home 10/15; right lateral leg wound is 100% slough covered very adherent. We have been using Mehdi  honey. This is inexpensive somewhat antibacterial. It does not seem to be making any headway as with the amount of eschar here. I can no longer debride him in this clinic. There is simply too much bleeding. He is not on blood thinners. I suspect this may be severe venous hypertension related to cor pulmonale/obesity hypoventilation syndrome 08/28/2019 TELEHEALTH visit. I saw this patient via telehealth visit today. I am accompanied by her clinical nurse and the patient had his care attendant who is his nephew. We have been using Medihoney with alginate to the large area on the right leg he is also using Medihoney to the pannus wounds. These have been going really quite well according to the nephew. 09/25/2019 TELEHEALTH visit. I saw this patient via telehealth today. He was with his nephew who we know and I was in the accompaniment of our nurse. They have been using Medihoney predominantly. Miraculously it looks as though the area on the right leg is a lot better. Difficult to get enough like to be certain whether he has epithelialization distally versus a adherent surface slough. His pannus wounds however are considerably better there is not any doubt about that. His nephew wraps his leg and 4-layer compression Allergies No Known Drug Allergies Objective Integumentary (Hair, Skin) Wound #14 status is Open. Original cause of wound was Gradually Appeared. The wound is located on the Right Abdomen - Lower Quadrant. The wound measures 1cm length x 3cm width x 0.1cm depth; 2.356cm^2 area and 0.236cm^3 volume. There is Fat Layer (Subcutaneous Tissue) Exposed exposed. There is no tunneling or undermining noted. There is a medium amount of serosanguineous drainage noted. The wound margin is epibole. There is large (67-100%) pink, pale granulation within the wound bed. There is no necrotic tissue within the wound bed. Wound #28 status is Healed - Epithelialized. Original cause of wound was Gradually  Appeared. The wound is located on the Right Upper Leg. The wound measures 0cm length x 0cm width x 0cm depth; 0cm^2 area and 0cm^3 volume. Wound #30 status is Open. Original cause of wound was Gradually Appeared. The wound is located on the Right,Medial Abdomen - Lower Quadrant. The wound measures 8cm length x 5cm width x 0.1cm depth; 31.416cm^2 area and 3.142cm^3 volume. There is Fat Layer (Subcutaneous Tissue) Exposed exposed. There is no tunneling or undermining noted. There is a medium amount of serosanguineous drainage noted. The wound margin is flat and intact. There is large (67-100%) red granulation within the wound bed. There is no necrotic tissue within the wound bed. Wound #9 status is Open. Original cause of wound was Gradually Appeared. The wound is located on the Right,Lateral Lower Leg. The wound measures 37cm length x 22cm width x 0.3cm depth; 639.314cm^2 area and 191.794cm^3 volume. There is Fat Layer (Subcutaneous Tissue) Exposed exposed. There is no tunneling or undermining noted. There is a large amount of serosanguineous drainage noted. The wound margin is distinct with the outline attached  to the wound base. There is medium (34-66%) red, pink granulation within the wound bed. There is a medium (34-66%) amount of necrotic tissue within the wound bed including Adherent Slough. 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 -  wound care encounter. Dressing Change Frequency: Change Dressing every other day. - all wounds Skin Barriers/Peri-Wound Care: Wound #14 Right Abdomen - Lower Quadrant: Antifungal powder - nystatin powder to abdominal skin folds Barrier cream - to periwound. Wound #30 Right,Medial Abdomen - Lower Quadrant: Barrier cream - to periwound. Wound #9 Right,Lateral Lower Leg: Barrier cream Moisturizing lotion Wound Cleansing: Wound #9 Right,Lateral Lower Leg: May shower with protection. - do not get right leg wet Other: - Clean all wounds with gauze and Anasept Primary Wound Dressing: Wound #14 Right Abdomen - Lower Quadrant: Other: - drawtex cut to fit wound bed margins. Wound #30 Right,Medial Abdomen - Lower Quadrant: Other: - drawtex cut to fit wound bed margins. Wound #9 Right,Lateral Lower Leg: Medihoney Alginate - sheets apply to wound bed. Secondary Dressing: Wound #14 Right Abdomen - Lower Quadrant: ABD pad Wound #30 Right,Medial Abdomen - Lower Quadrant: ABD pad Wound #9 Right,Lateral Lower Leg: ABD pad - Roll and layer ABDs under compression wrap to apply pressure to muscle. Edema Control: 4 layer compression - Right Lower Extremity Avoid standing for long periods of time Elevate legs to the level of the heart or above for 30 minutes daily and/or when sitting, a frequency of: - throughout the day Segmental Compressive Device. - Lymphedema pumps twice a day for 1 hour each 1. We continued with the Medihoney. It seems to be doing a good job and the major wound on the right lateral calf. This is a venous insufficiency wound which was massive at 1 point with secondary lymphedema. 2. He appears to have healed out in the abdominal pannus there may be a small open area left on the right anterior thigh 3. We will see him back in the clinic in 1 month. I will will see if the area on the distal part of the right leg wound represents a healing state Electronic Signature(s) Signed:  09/25/2019 5:50:02 PM By: Linton Ham MD Entered By: Linton Ham on 09/25/2019 15:42:13 -------------------------------------------------------------------------------- SuperBill Details Patient Name: Date of Service: [name] 09/25/2019 Medical Record [id number] Patient Account Number: [id number] Date of Birth/Sex: Treating RN: [id number] (59 y.o. 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 I89.0 Lymphedema, not elsewhere classified E66.01 Morbid (severe) obesity due to excess calories L97.112 Non-pressure chronic ulcer of right thigh with fat layer exposed Physician Procedures CPT4 Description: Code [id number] - WC PHYS LEVEL 2 - EST PT ICD-10 Diagnosis  Description L97.213 Non-pressure chronic ulcer of right calf with necrosis of m S31.103D Unspecified open wound of abdominal wall, right lower quadr into peritoneal cavity,  subsequent encounter Modifier: uscle ant without pene Quantity: 1 tration Electronic Signature(s) Signed: 09/25/2019 5:50:02 PM By: Linton Ham MD Entered By: Linton Ham on 09/25/2019 15:43:08

## 2019-09-25 NOTE — Progress Notes (Signed)
[name], [name] ([id number]) Visit Report for 09/25/2019 Allergy List Details Patient Name: Date of Service: [name], [name] 09/25/2019 1:30 PM Medical Record [id number] Patient Account Number: [id number] Date of Birth/Sex: Treating RN: [birth year] (59 y.o. [name] Primary Care Subrina Vecchiarelli: Margarita Rana Other Clinician: Referring Kearia Yin: Treating [name]/Extender:[name] in Treatment: 182 Allergies Active Allergies No Known Drug Allergies Allergy Notes Electronic Signature(s) Signed: 09/25/2019 5:50:02 PM By: Dwayne Ham MD Entered By: [name] on 09/25/2019 15:36:13 -------------------------------------------------------------------------------- Patient/Caregiver Education Details Patient Name: Date of Service: [name] 2/4/2021andnbsp1:30 PM Medical Record [phone] Patient Account Number: [id number] Date of Birth/Gender: Treating RN: 1961/03/12 (58 y.o. [name] Primary Care Physician: Margarita Rana Other Clinician: Referring Physician: Treating Physician/Extender:[name] in Treatment: [phone] Education Assessment Education Provided To: Patient Education Topics Provided Wound/Skin Impairment: Handouts: Skin Care Do's and Dont's Methods: Explain/Verbal Responses: Reinforcements needed Electronic Signature(s) Signed: 09/25/2019 5:56:18 PM By: [name] Entered By: [name] on 09/25/2019 14:43:01 -------------------------------------------------------------------------------- Wound Assessment Details Patient Name: Date of Service: [name], [name] 09/25/2019 1:30 PM Medical Record [id number] Patient Account Number: [id number] Date of Birth/Sex: Treating RN: Nov 05, 1960 (59 y.o. [name], [name] Primary Care [name]: Margarita Rana Other Clinician: Referring [name]: Treating [name]/Extender:[name] in Treatment: 182 Wound Status Wound Number: 14 Primary Atypical Etiology: Wound Location: Right Abdomen - Lower Quadrant Wound Open Wounding Event: Gradually Appeared Status: Date Acquired: 04/23/2016 Comorbid Anemia, Lymphedema, Sleep Apnea, Weeks Of Treatment: 177 History: Arrhythmia, Congestive Heart Failure, Clustered Wound: Yes Hypertension, Peripheral Venous Disease, Type II Diabetes, Gout, Osteoarthritis, Neuropathy, Confinement Anxiety Wound Measurements Length: (cm) 1 % Reduct Width: (cm) 3 % Reduct Depth: (cm) 0.1 Epitheli Clustered Quantity: 1 Tunnelin Area: (cm) 2.356 Undermi Volume: (cm) 0.236 Wound Description Classification: Full Thickness Without Exposed Support Foul Od Structures Slough/ Wound Epibole Margin: Exudate Medium Amount: Exudate Serosanguineous Type: Exudate red, brown Color: Wound Bed Granulation Amount: Large (67-100%) Granulation Quality: Pink, Pale Fascia E Necrotic Amount: None Present (0%) Fat Laye Tendon E Muscle E Joint Ex Bone Exp Electronic Signature(s) Signed: 09/25/2019 5:56:18 PM By: [name] Entered By: [name] on 02/04/2 or After Cleansing: No Fibrino Yes Exposed Structure xposed: No r (Subcutaneous Tissue) Exposed: Yes xposed: No xposed: No posed: No osed: No 021 14:40:17 ion in Area: 44.4% ion in Volume: 44.3% alization: Small (1-33%) g: No ning: No -------------------------------------------------------------------------------- Wound Assessment Details Patient Name: Date of Service: [name], [name] 09/25/2019 1:30 PM Medical Record [id number] Patient Account Number: [id number] Date of Birth/Sex: Treating RN: 10-14-1960 (59 y.o. [name], [name] Primary Care [name]: Margarita Rana Other Clinician: Referring [name]: Treating Asencion Loveday/Extender:[name] in Treatment: 182 Wound Status Wound Number: 28 Primary Diabetic Wound/Ulcer of the  Lower Etiology: Extremity Wound Location: Right Upper Leg Wound Status: Healed - Epithelialized Wounding Event: Gradually Appeared Date Acquired: 02/07/2018 Weeks Of Treatment: 85 Clustered Wound: No Wound Measurements Length: (cm) 0 % Reduction Width: (cm) 0 % Reduction Depth: (cm) 0 Area: (cm) 0 Volume: (cm) 0 Wound Description Classification: Grade 1 in Area: 100% in Volume: 100% Electronic Signature(s) Signed: 09/25/2019 5:56:18 PM By: [name] Entered By: [name] on 09/25/2019 14:19:22 -------------------------------------------------------------------------------- Wound Assessment Details Patient Name: Date of Service: [name], [name] 09/25/2019 1:30 PM Medical Record [id number] Patient Account Number: [id number] Date of Birth/Sex: Treating RN: 1960/09/22 (59 y.o. [name] Primary Care Troi Florendo: Margarita Rana Other Clinician: Referring Meeka Cartelli:  Treating Darrion Macaulay/Extender:Robson, Stefan Church, [name] Weeks in Treatment: 182 Wound Status Wound Number: 30 Primary MASD Moisture Associated Skin Damage Etiology: Wound Location: Right Abdomen - Lower Quadrant - Medial Wound Open Status: Wounding Event: Gradually Appeared Comorbid Anemia, Lymphedema, Sleep Apnea, Date Acquired: 06/13/2018 History: Arrhythmia, Congestive Heart Failure, Weeks Of Treatment: 67 Hypertension, Peripheral Venous Disease, Clustered Wound: No Type II Diabetes, Gout, Osteoarthritis, Neuropathy, Confinement Anxiety Wound Measurements Length: (cm) 8 % Reductio Width: (cm) 5 % Reductio Depth: (cm) 0.1 Epithelial Area: (cm) 31.416 Tunneling Volume: (cm) 3.142 Undermini Wound Description Wound Margin: Flat and Intact Exudate Amount: Medium Exudate Type: Serosanguineous Exudate Color: red, brown Wound Bed Granulation Amount: Large (67-100%) Granulation Quality: Red Necrotic Amount: None Present (0%) Foul Odor After Cleansing: No Slough/Fibrino  No Exposed Structure Fascia Exposed: No Fat Layer (Subcutaneous Tissue) Exposed: Yes Tendon Exposed: No Muscle Exposed: No Joint Exposed: No Bone Exposed: No n in Area: -2186.5% n in Volume: -2193.4% ization: Small (1-33%) : No ng: No Electronic Signature(s) Signed: 09/25/2019 5:56:18 PM By: [name] Entered By: [name] on 09/25/2019 14:41:12 -------------------------------------------------------------------------------- Wound Assessment Details Patient Name: Date of Service: [name], [name] 09/25/2019 1:30 PM Medical Record [id number] Patient Account Number: [id number] Date of Birth/Sex: Treating RN: [birth year] (59 y.o. [name], [name] Primary Care Refoel Palladino: Margarita Rana Other Clinician: Referring Saksham Akkerman: Treating Yusra Ravert/Extender:[name] in Treatment: 182 Wound Status Wound Number: 9 Primary Diabetic Wound/Ulcer of the Lower Extremity Etiology: Wound Location: Right Lower Leg - Lateral Wound Open Wounding Event: Gradually Appeared Status: Date Acquired: 03/10/2016 Comorbid Anemia, Lymphedema, Sleep Apnea, Weeks Of Treatment: 182 History: Arrhythmia, Congestive Heart Failure, Clustered Wound: Yes Clustered Wound: Yes Hypertension, Peripheral Venous Disease, Type II Diabetes, Gout, Osteoarthritis, Neuropathy, Confinement Anxiety Wound Measurements Length: (cm) 37 % Reductio Width: (cm) 22 % Reductio Depth: (cm) 0.3 Epithelial Area: (cm) 639.314 Tunneling Volume: (cm) 191.794 Undermini Wound Description Classification: Grade 2 Wound Margin: Distinct, outline attached Exudate Amount: Large Exudate Type: Serosanguineous Exudate Color: red, brown Wound Bed Granulation Amount: Medium (34-66%) Granulation Quality: Red, Pink Necrotic Amount: Medium (34-66%) Necrotic Quality: Adherent Slough Foul Odor After Cleansing: No Slough/Fibrino Yes Exposed Structure Fascia Exposed: No Fat Layer (Subcutaneous  Tissue) Exposed: Yes Tendon Exposed: No Muscle Exposed: No Joint Exposed: No Bone Exposed: No n in Area: -41630.7% n in Volume: -125255.6% ization: Medium (34-66%) : No ng: No Electronic Signature(s) Signed: 09/25/2019 5:56:18 PM By: [name] Entered By: [name] on 09/25/2019 14:41:45

## 2019-09-25 NOTE — Progress Notes (Signed)
CC: Both of my knees are hurting. I would like an injection in both knees.  The patient has had chronic pain and tenderness of both knees for some time.  Injections help.  There is no locking or giving way of the knee.  There is no new trauma. There is no redness or signs of infections.  The knees have a mild effusion and some crepitus.  There is no redness or signs of recent trauma.  Right knee ROM is 0-95 and left knee ROM is 0-90.  Impression:  Chronic pain of the both knees  Return:  1 month  PROCEDURE NOTE:  The patient requests injections of both knees, verbal consent was obtained.  The left and right knee were individually prepped appropriately after time out was performed.   Sterile technique was observed and injection of 1 cc of Depo-Medrol 40 mg with several cc's of plain xylocaine. Anesthesia was provided by ethyl chloride and a 20-gauge needle was used to inject each knee area. The injections were tolerated well.  A band aid dressing was applied.  The patient was advised to apply ice later today and tomorrow to the injection sight as needed.    Electronically Signed Sanjuana Kava, MD 2/4/202110:33 AM

## 2019-10-07 ENCOUNTER — Telehealth: Payer: Self-pay | Admitting: Orthopaedic Surgery

## 2019-10-07 DIAGNOSIS — G8929 Other chronic pain: Secondary | ICD-10-CM

## 2019-10-07 DIAGNOSIS — M25562 Pain in left knee: Secondary | ICD-10-CM

## 2019-10-07 DIAGNOSIS — M25561 Pain in right knee: Secondary | ICD-10-CM

## 2019-10-07 MED ORDER — OXYCODONE HCL 15 MG PO TABS: ORAL_TABLET | ORAL | 0 refills | Status: DC

## 2019-10-07 NOTE — Telephone Encounter (Signed)
Patient called for refill: oxyCODONE (ROXICODONE) 15 MG immediate release tablet / [facility]

## 2019-10-21 ENCOUNTER — Telehealth: Payer: Self-pay | Admitting: Orthopaedic Surgery

## 2019-10-21 NOTE — Telephone Encounter (Signed)
Patient called to relay he had a fall on 10/19/19; said he slipped and fell while getting into car, and that EMS came to assist. States had no treatment at emergency room or urgent care. Asked if he can have his regular appointment moved up by one week.  Relayed we will be glad to schedule, although, due to new injury/new problem, we will need to schedule accordingly.  Pending Dr Brooke Bonito advice for in-person evaluation for new problem.

## 2019-10-21 NOTE — Telephone Encounter (Signed)
Per Dr Brooke Bonito verbal instructions, advised patient to call EMS to transport him to [facility] ER for evaluation and work up due to the fall, as they will be able to best accomodate him with any special needs, including any imaging. Patient was hesitant, however, voiced understanding.

## 2019-10-23 ENCOUNTER — Encounter (HOSPITAL_BASED_OUTPATIENT_CLINIC_OR_DEPARTMENT_OTHER): Payer: Medicare HMO | Admitting: Internal Medicine

## 2019-10-23 ENCOUNTER — Ambulatory Visit: Payer: Medicare HMO | Admitting: Orthopaedic Surgery

## 2019-10-23 ENCOUNTER — Telehealth: Payer: Self-pay | Admitting: Orthopaedic Surgery

## 2019-10-23 NOTE — Telephone Encounter (Signed)
Spoke with patient 10/22/19 regarding his scheduled appointment for 10/23/19. Asked patient if he proceeded as discussed regarding evaluation at [facility] Emergency room per Dr Brooke Bonito advice, and he stated he did not go. He elected to contact [employer] 3rd party contact [facility]. Said they sent an EMT to evaluate him; states he did the push/pull test, squeezing test, and that he didn't really have any pain. States a tech will be coming back on 10/23/19 to take Xrays, and that the report will be sent to Dr Luna Glasgow. Patient has scheduled his regular follow up appointment with Dr Luna Glasgow for next Thursday, 10/30/19.

## 2019-10-24 ENCOUNTER — Telehealth: Payer: Self-pay | Admitting: Orthopaedic Surgery

## 2019-10-24 NOTE — Telephone Encounter (Signed)
Call from nurse case manager, Aneta, from [facility], ph [phone], [facility] affiliate; states have arranged for Xray of patient's left knee and ankle to be done by [facility] [employer] they will be going to patient's home today, 10/24/19; report will be faxed when ready. States if Dr Luna Glasgow needs films, please call.

## 2019-10-30 ENCOUNTER — Ambulatory Visit: Payer: Medicare HMO | Admitting: Orthopaedic Surgery

## 2019-11-03 ENCOUNTER — Telehealth: Payer: Self-pay

## 2019-11-03 DIAGNOSIS — G8929 Other chronic pain: Secondary | ICD-10-CM

## 2019-11-03 MED ORDER — OXYCODONE HCL 15 MG PO TABS: ORAL_TABLET | ORAL | 0 refills | Status: DC

## 2019-11-03 NOTE — Telephone Encounter (Signed)
Oxycodone 15 mg Qty 160 Tablets  PATIENT USES [facility] IN [place]

## 2019-11-13 ENCOUNTER — Other Ambulatory Visit: Payer: Self-pay | Admitting: Orthopaedic Surgery

## 2019-11-17 ENCOUNTER — Encounter (HOSPITAL_BASED_OUTPATIENT_CLINIC_OR_DEPARTMENT_OTHER): Payer: Medicare HMO | Admitting: Internal Medicine

## 2019-11-18 NOTE — Telephone Encounter (Signed)
I do not Rx this.  His wound care doctor does.  Contact that MD.

## 2019-11-24 ENCOUNTER — Encounter (HOSPITAL_BASED_OUTPATIENT_CLINIC_OR_DEPARTMENT_OTHER): Payer: Medicare HMO | Attending: Internal Medicine | Admitting: Internal Medicine

## 2019-11-24 ENCOUNTER — Other Ambulatory Visit: Payer: Self-pay

## 2019-11-24 DIAGNOSIS — L97213 Non-pressure chronic ulcer of right calf with necrosis of muscle: Secondary | ICD-10-CM | POA: Insufficient documentation

## 2019-11-24 DIAGNOSIS — L97112 Non-pressure chronic ulcer of right thigh with fat layer exposed: Secondary | ICD-10-CM | POA: Diagnosis not present

## 2019-11-24 DIAGNOSIS — I890 Lymphedema, not elsewhere classified: Secondary | ICD-10-CM | POA: Diagnosis not present

## 2019-11-24 DIAGNOSIS — X58XXXA Exposure to other specified factors, initial encounter: Secondary | ICD-10-CM | POA: Diagnosis not present

## 2019-11-24 DIAGNOSIS — S31103A Unspecified open wound of abdominal wall, right lower quadrant without penetration into peritoneal cavity, initial encounter: Secondary | ICD-10-CM | POA: Insufficient documentation

## 2019-11-24 DIAGNOSIS — I87331 Chronic venous hypertension (idiopathic) with ulcer and inflammation of right lower extremity: Secondary | ICD-10-CM | POA: Diagnosis not present

## 2019-11-24 DIAGNOSIS — Z6841 Body Mass Index (BMI) 40.0 and over, adult: Secondary | ICD-10-CM | POA: Diagnosis not present

## 2019-11-24 DIAGNOSIS — E08622 Diabetes mellitus due to underlying condition with other skin ulcer: Secondary | ICD-10-CM | POA: Diagnosis not present

## 2019-11-24 NOTE — Progress Notes (Addendum)
WILLARD, FARQUHARSON ([id number]) Visit Report for 11/24/2019 HPI Details Patient Name: Date of Service: EREZ, MCCALLUM 11/24/2019 1:15 PM Medical Record [id number] Patient Account Number: [id number] Date of Birth/Sex: Treating RN: 1961/04/04 (59 y.o. [name] Primary Care Provider: Margarita Rana Other Clinician: Referring Provider: Treating Provider/Extender:Marios Gaiser, Stefan Church, Elon Alas in Treatment: 51 History of Present Illness HPI Description: this is a patient with morbid obesity with a BMI over 70. He tells me that he hit his right ankle on part of the car in November 2015. He developed an MRSA infection in t.his man is been on and off anti-biotics ever since only recently finishing on Septra.he has also been on Keflex. He has a history of chronic venous ulcerations in his right lower leg and probably some component of the lymphedema. I don't see any recent cultures of this area nor do I think he is had x-rays that I am aware of. He also has a problem with recurrent wounds under his large abdominal pannus. The patient and his sister who accompanies him already seemed well versed in how to care for these. They already have interdry at home and applying nystatin powder. They are able usually to get these to close however there are 2 wounds here 1 on the abdominal pannus and one on his upper right thigh that are not closing. he also has had recurrent cellulitis in this area. 03/05/15; his wounds have not changed much from last week. All wonder underwent a surface debridement. The area on the right lateral malleolus a little more aggressively. There is no evidence of infection. The his sister reports no supplies came i.e. no so over alginate [[employer]]. 03/12/15 I think the wounds in both abdominal and on the anterior thigh looks somewhat better. no debridement was done today. The area on the right lateral malleolus underwent an aggressive debridement with a  curette to remove surface slough and underlying fibrinous exudate. We used a curette for this 03/19/15. Both of the wounds in his abdominal pannus and anterior thigh were selectively debridement with a curette. The area on the right lateral malleolus again underwent an aggressive debridement. This is a deep wound in proximity to the underlying malleolus. It appears that there is drainage here and underlying maceration. 04/09/15. Both wounds in the abdominal pannus and anterior thigh appears smaller with clean granulated bases. The area over the right lateral malleolus again required a selective debridement. This is a deep wound. There is no drainage and no evidence of infection that is obvious 9/2/16both his wound area in the abdominal pannus and anterior thigh are considerably better. The area over the right lateral malleolus is filled in with granulation tissue. No debridement was required in either area. 05/07/15; both his wound areas in the abdominal pannus and anterior right thigh continued to gradually improve with advancing epithelialization. There are no options here other than some over alginate aggressive cleaning and drying and offloading which they're doing with a rolled towel.. The area on the right lateral malleolus is also considerably better 05/28/15; the area on the right lateral malleolus is fully epithelialized and requires nothing but a protective foam dressing. The area on the abdominal pannus on the right and the right anterior thigh also continued to regress in size with impressive degrees of surrounding epithelialization 09/30/15; this is a man who has morbid obesity, chronic venous insufficiency and lymphedema. He also had wounds under his large abdominal pannus on the right side. We care for him through  the summer and fall of 2016. According to his wife they did not return because the wound over the right lateral malleolus and is pannus wounds had healed. Therefore we did not  really provide ongoing compression. They tell me that over the last 4-5 weeks he has redeveloped the wounds on the pannus both on the right lower quadrant of his abdomen and the right upper thigh. More worrisome to them wounds on the right calf in 2 areas anterolateral and a large area anteromedially. They have been using what they had left over from last time including silver alginate, a piece of Hydrofera Blue and apparently Unna boots although I don't remember ordering the latter for them. 10/07/15; the area on the right medial ankle is improved with advancing epithelialization although there is still some open areas and some macerated tissue at risk below the open areas here. I am less concerned about this than I was last week. The area under his pannus on his right thigh is stable and smaller. The area on the abdominal pannus itself is unchanged and was debridement of a gritty surface slough 10/14/15 the area on the right medial leg is still inflamed, there is some maceration. We changed to [facility] Blue last week and I don't think that this is contact dermatitis from [facility] but I feel we should change to something with more absorptive properties. The area on the thigh under the pannus is considerably improved while the abdominal wound is largely unchanged 10/22/15; the patient has had some deterioration in the lower leg wounds with considerable poorly controlled edema in the right leg. There wounds on the lateral and medial aspect of. None of these look to be infected. The pannus wounds are improved on the thigh not improved on the abdomen 04/28/16; patient is been coming here for the last month although I've managed to miss him every Friday. He tells me he developed a wound on his right lateral leg 5 or 6 weeks ago. He saw it in the mirror in the bathroom. no obvious etiology Is using Santyl to this. He also has another wound on the lower abdominal pannus 05/05/16; no obvious change to  the wound on the right lateral leg. Continue using Santyl. Area on his abdominal pannus appears to be improved silver alginate 05/12/16; the right leg wound has more exposed healthy granulation. We seemed to be making some progress. They're using Santyl at home and his wife is changing the dressing including applying an Haematologist. The area on his abdominal pannus appears stable to improved 05/19/16; the right leg has more exposed healthy granulation. Still tightly adherent nonviable tissue on the circumference the requires debridement. We have been using Santyl at home. The area on his abdominal pannus can tenuous to improve 06/09/16; haven't seen this patient in 3 weeks he has been using Santyl at home due to a wound on the right lateral calf. He has chronic venous insufficiency as the presumed etiology. His wife changes the dressings including the lunar wraps. He reappeared here I think in August with this wound. Unfortunately dimensions are worse by roughly 3 x 1 cm today. A lot of necrotic tissue around the circumference of this wound nothing here looks healthy. This is not overtly infected. His pannus wound on the right looks better and smaller 06/16/16; the patient's wound is longer but narrower although the base of this as less adherent slough the changing pattern of this may be feel that this would need to be biopsied  and I went ahead and did this today. His pannus wound does not look much better and there is now a mirror image opening on his anterior thigh on the right. We had been using Iodoflex 06/22/16; biopsy I did last week was negative for malignancy findings suggestive of a stasis ulcer his sister is changing the dressing 1 time with an Unna wrap that she has supplies at home. 06/29/16; wound is somewhat improved on right leg but still a large wound. Both wounds on right pannus on right leg both required debridement. 07/06/16 large wound on his lateral right leg. Most of the surface  of this is much improved versus a month ago at which time he had severe adherent surface slough. The auto flexes help with this. He also has 2 wounds one on his right lower abdominal pannus and the upper part of his right thigh. These are probably friction area wounds 07/20/16; this man has a large venous insufficiency wound on the lateral right leg. Biopsy previously negative. Over the last month we managed to get the surface of this down to something that looks healthy. He also has 2 wounds on his right lower abdominal pannus and the anterior right thigh underneath it. Using silver alginate here 07/28/16; patient had a fall yesterday hurting his left ankle therefore he missed his appointment. X-rays in the ER were negative. Having some discomfort. We have been using silver alginate to the areas on his right lateral calf and also the pannus wounds 08/04/16; large wound on the lateral aspect of his right calf in the setting of severe chronic venous insufficiency and inflammation. We finally have this down to a reasonably healthy base although debridement was still required today. We have been using Hydrofera Blue 08/18/16; large wound on the lateral aspect of his right calf in the setting of chronic venous insufficiency. Measures smaller in terms of with length is about the same base of this wound looks stable although there is still protruding granulation. This suggests more pressure is required. He has 2 new wounds inferiorly which she says are from traumatizing the leg against the bed frame. Has been using silver alginate to the pannus and thigh wounds. Hydrofera Blue to the major wounds on the right 09/01/16 substantial wound on the lateral aspect of his right calf just below the knee in the setting of chronic venous hypertension and lymphedema. This measures slightly smaller although this is a substantial wound. He has been using Hydrofera Blue here. 2 wounds involving the right pannus and  right anterior thigh using silver alginate 09/15/16; substantial wound on the lateral aspect of his right calf just below the knee in the setting of severe chronic venous hypertension and lymphedema. He has a substantial area of epithelialization here superiorly however in the middle of it or to open areas when I was questioning him about this his sister states that he hit this on the bed frame and he admits to doing so. Other than at the wound itself is substantial but appears to be healthy. He has 2 small open areas below the wound which apparently were also secondary to trauma on the bed frame. Finally he has the areas on the right abdominal pannus, right anterior thigh 09/28/16; substantial wound on the lateral aspect of his right calf just below the knee in the setting of chronic venous hypertension and lymphedema. He has done well in this wound is better certainly narrower with a reasonably healthy granulation. He continues to have 2 small  open areas on the right abdominal pannus in the right anterior thigh and a G0 largely friction type wounds from the 2 surfaces. These have largely Stolled we have been using Hydrofera Blue on the right leg and silver alginate long-standing on the pannus wounds also on the right 10/13/16; substantial wound on the lateral aspect of his right Is definitely down in dimensions. This is the setting of chronic venous hypertension and lymphedema. He has some secondary lesions inferiorly which I think we are measuring overall wound area. We have been using Hydrofera Blue under 4-layer compression. 10/27/16; slightly smaller in terms of wound dimensions. Visually the wound looks stable, I was looking for some improvement in the overall dimensions beyond what we actually have. I'm going to give this another 2 weeks with an unchanged dressing however if there are not more impressive changes I'll consider a substantial wound debridement 11/09/16; no major changes in the  condition of the wound on the left lateral calf. This time however covered in a tightly adherent necrotic surface. No surrounding infection. The wounds in his pannus slightly smaller the area on the right anterior thigh slightly larger although the patient states with the death of his sister he has been up a lot more. He has not been systemically unwell. The edema in the leg looks reasonably well controlled 11/24/16-he is here for follow-up evaluation. He continues to have a significant lesion in the lateral aspect of his leg. He admits he is aware that this is exacerbated by pressure but has been unable to find a way to consistently or effectively offload this. He has voiced seeing no complaints or concerns 12/08/16; here for follow-up evaluation significant wound on the right lateral leg still to refractory areas in the right lower abdominal pannus and is on his right thigh. He arrives today with fairly good edema control 12/21/16; patient is here for follow-up of an extensive wound on the right lateral leg below the knee as well as the right lower abdominal pannus and right upper thigh wound. We have been using Hydrofera Blue to the right leg wound and silver alginate with interdry to the pannus wounds which appear to be making good progress. 01/05/17; extensive area on the right lateral leg below the knee. No major change in this. Thick adherent necrotic surface. We have been using Hydrofera Blue without apparent improvement. He also has wounds on his pannus both on the abdomen and the thigh not much change from last time we have been using silver alginate here 01/19/17- He is here for follow up evaluation. He states that his glucose levels have been fluctuating between 150-200; this morning fasting level 162. He is voicing no concerns, complaints, no fever/chills. 02/02/17; as far as his wounds are concerned things are not going particularly well. He has a new wound in the pannus medially to the  original wound. He arrives today with this area moist malodorous. The area on the right lateral leg some improved although he still has a deeper satellite area underneath the original wound. 02/16/17. 2 week visit using Iodoflex on the leg wounds and silver alginate on his pannus wounds. 03/09/17; haven't seen this man in about 3 weeks. His abdominal/upper thigh pannus wounds look a lot better using silver alginate right lateral leg wound perhaps some visual improvement in terms of dimensions but the dimensions and measurements or not that different. 04/27/17; seen by Dr. Con Memos while I was oriented vacation early last month. Using [facility] with continued improvement in  the conditions of the wound bed. He is using silver alginate to the wounds in the abdominal/thigh pannus 06/01/17; patient has not been seen here in over a month. He is been using Hydrofera Blue with continued improvement in the conditions of the wound bed on the right lateral leg. He uses silver alginate to the abdominal/thigh pannus area with inter-dry and rolled gauze 06/15/17; two-week follow-up appointment. Large wound on the right lateral leg predominantly venous insufficiency. He has a smaller satellite lesion under that wound. These have been making progress with [facility] He has to pannus wounds one on the lower abdomen one on the upper right thigh. 06/29/17; 2 week follow-up appointment. He tells me that he had cellulitis on the lower abdominal pannus/lymphedema. This was treated by phone with phone and antibiotics by his primary physician using Keflex 500 mg by mouth every 6. Large wound on the right lateral thigh we've been using Hydrofera Blue. This is venous insufficiency wound. Smaller lesion under the wound. We have much better surfaces with the Hydrofera Blue Pannus wounds on the abdominal and thigh on the right 07/27/17; with regards to his right lateral leg he has a "Coke bottle" shaped wound on the left  lateral leg this appears to be somewhat better perhaps slightly smaller, certainly a better-looking surface. He has satellite wounds underneath this with adherent necrotic debris and a new small wound laterally. He also has 4 small open areas anteriorly on the left leg which may be a wrap injury or scissor injury His pannus wounds are really about the same. he has Interdry. However I don't believe this is offloading this enough or keeping this dry enough 08/10/17; not much change in any of these wounds perhaps slightly better and the major wounds superiorly and slightly worse in the satellite lesion inferior to the major wound. He also has a second satellite lesion medially. We have been using Hydrofera Blue He tells me he is being more aggressive offloading the wounds on the right pannus both in the lower abdomen and anterior thigh seen silver alginate here 08/24/17; lateral right leg wound looks about the same a bit disappointing given the aggressive debridement I did 2 weeks ago. The satellite lesion underneath this is worse and requires debridement He has the usual wounds on the inferior part of his pannus and the underlying thigh however deep in the recess there is new open areas here 3 measured as a small cluster 09/07/17; lateral right leg wound had some necrotic debris in the mid aspect. Our intake nurse noted what appears to be striated muscle although I am not exactly sure of this after this visit. It did not move with flexion and extension. I been assuming that this is a a venous ulcer associated with venous inflammation and secondary lymphedema. He also has an enlarging wound which was the satellite lesion underneath this. Finally he has the area under his abdominal pannus and the area of his thigh under the abdominal pannus on the right. We have been using silver alginate 09/28/17; the lateral right leg bottle shaped wound is about the same. What started as a small satellite lesion  below this is rapidly expanded and I biopsied this. I had previously biopsied the large wound itself in late 2017. His pannus wounds look better. We have use silver alginate for a long period 10/12/17; the lateral right leg bottle-shaped wounds superiorly is about the samestill adherent and slough covered.. The satellite area inferiorly has copious amounts of necrotic material.  Culture I did last time showed Pseudomonassensitive to ciprofloxacin and he is finishing this now. Tolerating it well. Biopsy I did did not show any malignancy or other abnormalities. Suggestive of a chronic venous ulcer 10/25/17; the lateral right leg bottle shaped wound continues to be about the same. At the bottom this is beginning to cold less with what was a satellite lesion underneath it I suspect both of these will merge at some point. He has what appears to be protruding muscle through the middle part of the wound. I've been attempting to put enough compression on this area to counteract that. He has lymphedema and I wonder about compression pumps. He has [employer] 11/22/17; chronic venous insufficiency with inflammation and lymphedema. The area on the right calf is original wound is may be slightly smaller however the wounds have now continued down his leg and now has a new one on the lateral malleolus and 2 new areas on the anterior calf. Looking through his records I don't see arterial insufficiency although his peripheral pulses are palpable. He also has wounds under the abdominal pannus and on the front of his right thigh. We are using silver alginate absorbent dressings in this area 12/20/17; chronic venous insufficiency with inflammation and lymphedema. The patient has 3 separate wounds on the right lateral calf. This goes from just below his knee down to the right lateral malleolus. He has a new wound medially on the right calf. Several excoriated areas anteriorly. We have been using silver alginate, the  wound area makes any other type of dressing difficult simply because of the volume of open areas. His nephew changes his wrap and per our intake nurse he does a good job. He does not have compression pumps. 01/10/18; chronic venous insufficiency with inflammation and lymphedema. He has wounds right along the right lateral calf. Also wounds on the upper abdomen under the pannus and the anterior right thigh. We've been using Fibracol. He has had some improvement in the superior part of the leg wound. He uses 4 layer compression that his nephew puts on him and he apparently doesn't good job. He received a phone call to say that his insurance would not cover compression pumps 02/07/18; chronic venous insufficiency with inflammation and lymphedema. He is not eligible for compression pumps through his insurance. We are using 4 layer compression. He has had some improvement in the inferior part of the large wound on his left lateral leg but some deterioration in the superior aspect. There was some drainage coming out of this. I didn't see any evidence of infection 03/07/18; chronic venous insufficiency with inflammation and lymphedema. We now think there may be an avenue to get compression pumps covered through his insurance. We are using 4 layer compression. We are using Furber call to the large wound area which I think are looking some better in the dimensions are better. Some of the smaller wounds on the anterior right leg healed. The area between the large abdominal pannus and the anterior right thigh is worse. The wound on the thigh is worse. There is surrounding fungal dermatitis which is probably Candida intertrigo. The areas reasonably substantial 04/18/18; this patient I have not seen in about a month and a half. He tells me since he is been here he had fairly extensive cellulitis involving his lower abdominal pannus and anterior right thigh. Wounds in this area are therefore today quite a bit  worse. He also has had worsening of the large wounds along his  lateral right calf which as I remember things were quite a bit better 6 weeks ago. He does have his lymphedema pumps apparently on approval status but still not delivered to his home. We have been using Fibracol. Limited amount of dressings we can use secondary to the substantial overall wound area 05/16/2018; monthly follow-up. The patient has obtained his external compression pumps and use them 4 times. There is a remarkable improvement in the amount of edema in the right lower leg and some improvement in the generalized erythema especially on the right lateral calf. However he continues to have a substantial wound area basically from just around the lateral malleolus up to his fibular head. He seems to tolerate this remarkably well. We have been using purachol 06/13/2018 monthly follow-up. Patient states he uses his compression pumps once a day sometimes twice. He has a lot of improvement in the edema but still a colossal full-thickness wound on the right lateral calf all the way from the ankle to just below the knee. She also has a new area medially in the lower abdominal pannus We have been using silver alginate on the abdominal pannus/thigh wounds and pure call on the right lateral calf. He uses ABD pads to try and push the bulging muscle back into the calf area I certainly encourage this 07/04/2018; patient is using compression pumps at home. A lot of improvement in the edema but still a colossal full- thickness wound on the right lateral calf from the ankle to the knee. This has exposed muscle. She has 3 areas on the pannus of his abdomen to on the abdominal side and one on the right anterior thigh we have been using silver alginate 08/01/2018. Large wound on the right lateral calf and ankle. Things look actually stable here. Surface is stable he has some epithelialization. Edema control is adequate using compression pumps 1-2  times per day In the large abdominal folds he still has the area on the anterior thigh he has 3 wounds on the pannus we are using silver alginate 08/30/2018; the large wound on his right lateral calf and ankle. Miraculously looks some better. He has large rims of skin coming inferiorly distally and more proximally superiorly. This really looks like it is doing well. Macerated wounds on his pannus all about the same. There are 3 wounds 2 on the thigh one on the abdomen. Areas moist. They have been using silver alginate 2/7/; large anterior calf wounds on the right lateral calf and ankle. He has been using Fibracol. Arrives today with a very adherent necrotic surface that I could not wash off with Anasept. No change in the pannus wounds using silver alginate here 3/6; large wounds on the right lateral calf and ankle. Actually has a better looking surface. Furthermore some epithelialization. This is a very large wound but appears to be making some progress there using silver alginate He has 3 wounds. In the abdominal pannus against his thigh one on the lower abdomen, one right in the inguinal area and one on the anterior thigh. These areas are wet and macerated. Simply too much pressure here 4/13; telehealth visit out of concerns for the Covid epidemic. We did not have clear images. The patient is using silver alginate but he ran out of this and is using Hydrofera Blue on his leg and silver alginate on the wounds on his thigh and pannus area on the right. 5/7; some improvement in the right lateral leg wound using silver alginate however he has  2 large areas on the lower abdominal pannus one on the right anterior thigh that really are larger and remain in constantly warm macerated situation. 6/12; the area on the right lateral leg perhaps somewhat less in terms of width but the entire wound surface is covered with a very adherent debris which I think inhibits any degree of epithelialization. We have  been using silver alginate to this large wound area, there is not much else to choose from. We did try Fibracol for a while. This did not really help. He has the areas on the lower abdominal pannus and upper thigh we have simply been using drawtex to this and things look somewhat better today. 7/27; no change in the right lateral leg wound covered in very adherent surface slough. The areas on the abdomen and pannus and upper thigh on the right actually look better. We have been using silver alginate to all wounds. 8/27; right lateral leg wound is 100% slough covered very adherent. The area on the abdomen and pannus upper thigh all look better. We have been using just drawtex on these areas. He is using his external compression pumps at home 10/15; right lateral leg wound is 100% slough covered very adherent. We have been using Mehdi honey. This is inexpensive somewhat antibacterial. It does not seem to be making any headway as with the amount of eschar here. I can no longer debride him in this clinic. There is simply too much bleeding. He is not on blood thinners. I suspect this may be severe venous hypertension related to cor pulmonale/obesity hypoventilation syndrome 08/28/2019 TELEHEALTH visit. I saw this patient via telehealth visit today. I am accompanied by her clinical nurse and the patient had his care attendant who is his nephew. We have been using Medihoney with alginate to the large area on the right leg he is also using Medihoney to the pannus wounds. These have been going really quite well according to the nephew. 09/25/2019 TELEHEALTH visit. I saw this patient via telehealth today. He was with his nephew who we know and I was in the accompaniment of our nurse. They have been using Medihoney predominantly. Miraculously it looks as though the area on the right leg is a lot better. Difficult to get enough like to be certain whether he has epithelialization distally versus a adherent  surface slough. His pannus wounds however are considerably better there is not any doubt about that. His nephew wraps his leg and 4-layer compression 4/5 TELEHEALTH visit. The patient was seen by telehealth today accompanied by his nephew and on our end our case manager. It is difficult to tell under the illumination we have however there may be some epithelialization. Whether this is adherent nonviable surface I cannot tell on this type of visit. His area on the right anterior thigh is healed and the areas under the right pannus look better Electronic Signature(s) Signed: 12/15/2019 5:40:04 PM By: Linton Ham MD Entered By: Linton Ham on 12/15/2019 09:27:48 -------------------------------------------------------------------------------- Physical Exam Details Patient Name: Date of Service: CHRISANGEL, ESKENAZI 11/24/2019 1:15 PM Medical Record [id number] Patient Account Number: [id number] Date of Birth/Sex: Treating RN: [birth year] (59 y.o. [name] Primary Care Provider: Margarita Rana Other Clinician: Referring Provider: Treating Provider/Extender:Alf Doyle, Stefan Church, Elon Alas in Treatment: 191 Notes Wound exam; the extensive area on the right lateral calf continues to look somewhat better. It looks as though he has a viable surface but by telehealth I cannot really determine whether this is truly  beginning epithelialization. This is the same as when he was in the clinic last time. His pannus wounds actually look a lot better. The area on the right anterior thigh looks healed superficial areas on the lower abdominal pannus Electronic Signature(s) Signed: 12/15/2019 5:40:04 PM By: Linton Ham MD Entered By: Linton Ham on 12/15/2019 09:28:40 -------------------------------------------------------------------------------- Physician Orders Details Patient Name: Date of Service: ROYSTON, BEKELE 11/24/2019 1:15 PM Medical Record  [id number] Patient Account Number: [id number] Date of Birth/Sex: Treating RN: Apr 24, 1961 (59 y.o. [name] Primary Care Provider: Margarita Rana Other Clinician: Referring Provider: Treating Provider/Extender:Kinaya Hilliker, Stefan Church, Elon Alas in Treatment: [phone] Verbal / Phone Orders: No Diagnosis Coding Follow-up Appointments Return appointment in 1 month. Dressing Change Frequency Wound #9 Right,Lateral Lower Leg Change Dressing every other day. Skin Barriers/Peri-Wound Care Wound #9 Right,Lateral Lower Leg Barrier cream Moisturizing lotion Wound Cleansing Wound #9 Right,Lateral Lower Leg May shower with protection. - do not get right leg wet Other: - Clean all wounds with gauze and Anasept Primary Wound Dressing Wound #9 Right,Lateral Lower Leg Medihoney Alginate - sheets apply to wound bed. Secondary Dressing Wound #9 Right,Lateral Lower Leg ABD pad - Roll and layer ABDs under compression wrap to apply pressure to muscle. Zetuvit or Kerramax Edema Control 4 layer compression - Right Lower Extremity Avoid standing for long periods of time Elevate legs to the level of the heart or above for 30 minutes daily and/or when sitting, a frequency of: - throughout the day Segmental Compressive Device. - Lymphedema pumps twice a day for 1 hour each Electronic Signature(s) Signed: 11/24/2019 5:09:30 PM By: Levan Hurst RN, BSN Signed: 11/24/2019 5:24:04 PM By: Linton Ham MD Entered By: Levan Hurst on 11/24/2019 14:17:59 -------------------------------------------------------------------------------- Problem List Details Patient Name: Date of Service: CHAZE, HRUSKA 11/24/2019 1:15 PM Medical Record [id number] Patient Account Number: [id number] Date of Birth/Sex: Treating RN: May 29, 1961 (59 y.o. 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 07/06/2016 No Yes muscle I87.331 Chronic venous hypertension (idiopathic) with ulcer and 03/24/2016 No Yes inflammation of right lower extremity S31.103D Unspecified open wound of abdominal wall, right lower 07/06/2016 No Yes quadrant without penetration into peritoneal cavity, subsequent encounter E08.622 Diabetes mellitus due to underlying condition with other 03/24/2016 No Yes skin ulcer I89.0 Lymphedema, not elsewhere classified 10/25/2017 No Yes E66.01 Morbid (severe) obesity due to excess calories 03/24/2016 No Yes L97.112 Non-pressure chronic ulcer of right thigh with fat layer 07/04/2018 No Yes exposed Inactive Problems Resolved Problems Electronic Signature(s) Signed: 12/15/2019 5:40:04 PM By: Linton Ham MD Entered By: Linton Ham on 12/15/2019 26:83:41 -------------------------------------------------------------------------------- Progress Note Details Patient Name: Date of Service: [name] 11/24/2019 1:15 PM Medical Record [id number] Patient Account Number: [id number] Date of Birth/Sex: Treating RN: [birth year] (59 y.o. [name] Primary Care Provider: Margarita Rana Other Clinician: Referring Provider: Treating Provider/Extender:Shaelynn Dragos, Stefan Church, Elon Alas in Treatment: 191 Subjective History of Present Illness (HPI) this is a patient with morbid obesity with a BMI over 70. He tells me that he hit his right ankle on part of the car in November 2015. He developed an MRSA infection in t.his man is been on and off anti-biotics ever since only recently finishing on Septra.he has also been on Keflex. He has a history of chronic venous ulcerations  in his right lower leg and probably some component of the lymphedema. I don't see any recent cultures of this area  nor do I think he is had x-rays that I am aware of. He also has a problem with recurrent wounds under his large abdominal pannus. The patient and his sister who accompanies him already seemed well versed in how to care for these. They already have interdry at home and applying nystatin powder. They are able usually to get these to close however there are 2 wounds here 1 on the abdominal pannus and one on his upper right thigh that are not closing. he also has had recurrent cellulitis in this area. 03/05/15; his wounds have not changed much from last week. All wonder underwent a surface debridement. The area on the right lateral malleolus a little more aggressively. There is no evidence of infection. The his sister reports no supplies came i.e. no so over alginate [[employer]]. 03/12/15 I think the wounds in both abdominal and on the anterior thigh looks somewhat better. no debridement was done today. The area on the right lateral malleolus underwent an aggressive debridement with a curette to remove surface slough and underlying fibrinous exudate. We used a curette for this 03/19/15. Both of the wounds in his abdominal pannus and anterior thigh were selectively debridement with a curette. The area on the right lateral malleolus again underwent an aggressive debridement. This is a deep wound in proximity to the underlying malleolus. It appears that there is drainage here and underlying maceration. 04/09/15. Both wounds in the abdominal pannus and anterior thigh appears smaller with clean granulated bases. The area over the right lateral malleolus again required a selective debridement. This is a deep wound. There is no drainage and no evidence of infection that is obvious 9/2/16both his wound area in the abdominal pannus and anterior thigh are considerably better. The area over the right lateral malleolus is filled in with granulation tissue. No debridement was required in either area. 05/07/15; both his  wound areas in the abdominal pannus and anterior right thigh continued to gradually improve with advancing epithelialization. There are no options here other than some over alginate aggressive cleaning and drying and offloading which they're doing with a rolled towel.. The area on the right lateral malleolus is also considerably better 05/28/15; the area on the right lateral malleolus is fully epithelialized and requires nothing but a protective foam dressing. The area on the abdominal pannus on the right and the right anterior thigh also continued to regress in size with impressive degrees of surrounding epithelialization 09/30/15; this is a man who has morbid obesity, chronic venous insufficiency and lymphedema. He also had wounds under his large abdominal pannus on the right side. We care for him through the summer and fall of 2016. According to his wife they did not return because the wound over the right lateral malleolus and is pannus wounds had healed. Therefore we did not really provide ongoing compression. They tell me that over the last 4-5 weeks he has redeveloped the wounds on the pannus both on the right lower quadrant of his abdomen and the right upper thigh. More worrisome to them wounds on the right calf in 2 areas anterolateral and a large area anteromedially. They have been using what they had left over from last time including silver alginate, a piece of Hydrofera Blue and apparently Unna boots although I don't remember ordering the latter for them. 10/07/15; the area on the right medial ankle is  improved with advancing epithelialization although there is still some open areas and some macerated tissue at risk below the open areas here. I am less concerned about this than I was last week. The area under his pannus on his right thigh is stable and smaller. The area on the abdominal pannus itself is unchanged and was debridement of a gritty surface slough 10/14/15 the area on the right  medial leg is still inflamed, there is some maceration. We changed to [facility] Blue last week and I don't think that this is contact dermatitis from [facility] but I feel we should change to something with more absorptive properties. The area on the thigh under the pannus is considerably improved while the abdominal wound is largely unchanged 10/22/15; the patient has had some deterioration in the lower leg wounds with considerable poorly controlled edema in the right leg. There wounds on the lateral and medial aspect of. None of these look to be infected. The pannus wounds are improved on the thigh not improved on the abdomen 04/28/16; patient is been coming here for the last month although I've managed to miss him every Friday. He tells me he developed a wound on his right lateral leg 5 or 6 weeks ago. He saw it in the mirror in the bathroom. no obvious etiology Is using Santyl to this. He also has another wound on the lower abdominal pannus 05/05/16; no obvious change to the wound on the right lateral leg. Continue using Santyl. Area on his abdominal pannus appears to be improved silver alginate 05/12/16; the right leg wound has more exposed healthy granulation. We seemed to be making some progress. They're using Santyl at home and his wife is changing the dressing including applying an Haematologist. The area on his abdominal pannus appears stable to improved 05/19/16; the right leg has more exposed healthy granulation. Still tightly adherent nonviable tissue on the circumference the requires debridement. We have been using Santyl at home. The area on his abdominal pannus can tenuous to improve 06/09/16; haven't seen this patient in 3 weeks he has been using Santyl at home due to a wound on the right lateral calf. He has chronic venous insufficiency as the presumed etiology. His wife changes the dressings including the lunar wraps. He reappeared here I think in August with this wound. Unfortunately  dimensions are worse by roughly 3 x 1 cm today. A lot of necrotic tissue around the circumference of this wound nothing here looks healthy. This is not overtly infected. His pannus wound on the right looks better and smaller 06/16/16; the patient's wound is longer but narrower although the base of this as less adherent slough the changing pattern of this may be feel that this would need to be biopsied and I went ahead and did this today. His pannus wound does not look much better and there is now a mirror image opening on his anterior thigh on the right. We had been using Iodoflex 06/22/16; biopsy I did last week was negative for malignancy findings suggestive of a stasis ulcer his sister is changing the dressing 1 time with an Unna wrap that she has supplies at home. 06/29/16; wound is somewhat improved on right leg but still a large wound. Both wounds on right pannus on right leg both required debridement. 07/06/16 large wound on his lateral right leg. Most of the surface of this is much improved versus a month ago at which time he had severe adherent surface slough. The auto flexes  help with this. He also has 2 wounds one on his right lower abdominal pannus and the upper part of his right thigh. These are probably friction area wounds 07/20/16; this man has a large venous insufficiency wound on the lateral right leg. Biopsy previously negative. Over the last month we managed to get the surface of this down to something that looks healthy. He also has 2 wounds on his right lower abdominal pannus and the anterior right thigh underneath it. Using silver alginate here 07/28/16; patient had a fall yesterday hurting his left ankle therefore he missed his appointment. X-rays in the ER were negative. Having some discomfort. We have been using silver alginate to the areas on his right lateral calf and also the pannus wounds 08/04/16; large wound on the lateral aspect of his right calf in the setting of  severe chronic venous insufficiency and inflammation. We finally have this down to a reasonably healthy base although debridement was still required today. We have been using Hydrofera Blue 08/18/16; large wound on the lateral aspect of his right calf in the setting of chronic venous insufficiency. Measures smaller in terms of with length is about the same base of this wound looks stable although there is still protruding granulation. This suggests more pressure is required. He has 2 new wounds inferiorly which she says are from traumatizing the leg against the bed frame. Has been using silver alginate to the pannus and thigh wounds. Hydrofera Blue to the major wounds on the right 09/01/16 substantial wound on the lateral aspect of his right calf just below the knee in the setting of chronic venous hypertension and lymphedema. This measures slightly smaller although this is a substantial wound. He has been using Hydrofera Blue here. 2 wounds involving the right pannus and right anterior thigh using silver alginate 09/15/16; substantial wound on the lateral aspect of his right calf just below the knee in the setting of severe chronic venous hypertension and lymphedema. He has a substantial area of epithelialization here superiorly however in the middle of it or to open areas when I was questioning him about this his sister states that he hit this on the bed frame and he admits to doing so. Other than at the wound itself is substantial but appears to be healthy. He has 2 small open areas below the wound which apparently were also secondary to trauma on the bed frame. Finally he has the areas on the right abdominal pannus, right anterior thigh 09/28/16; substantial wound on the lateral aspect of his right calf just below the knee in the setting of chronic venous hypertension and lymphedema. He has done well in this wound is better certainly narrower with a reasonably healthy granulation. He continues to  have 2 small open areas on the right abdominal pannus in the right anterior thigh and a G0 largely friction type wounds from the 2 surfaces. These have largely Stolled we have been using Hydrofera Blue on the right leg and silver alginate long-standing on the pannus wounds also on the right 10/13/16; substantial wound on the lateral aspect of his right Is definitely down in dimensions. This is the setting of chronic venous hypertension and lymphedema. He has some secondary lesions inferiorly which I think we are measuring overall wound area. We have been using Hydrofera Blue under 4-layer compression. 10/27/16; slightly smaller in terms of wound dimensions. Visually the wound looks stable, I was looking for some improvement in the overall dimensions beyond what we actually have. I'm  going to give this another 2 weeks with an unchanged dressing however if there are not more impressive changes I'll consider a substantial wound debridement 11/09/16; no major changes in the condition of the wound on the left lateral calf. This time however covered in a tightly adherent necrotic surface. No surrounding infection. The wounds in his pannus slightly smaller the area on the right anterior thigh slightly larger although the patient states with the death of his sister he has been up a lot more. He has not been systemically unwell. The edema in the leg looks reasonably well controlled 11/24/16-he is here for follow-up evaluation. He continues to have a significant lesion in the lateral aspect of his leg. He admits he is aware that this is exacerbated by pressure but has been unable to find a way to consistently or effectively offload this. He has voiced seeing no complaints or concerns 12/08/16; here for follow-up evaluation significant wound on the right lateral leg still to refractory areas in the right lower abdominal pannus and is on his right thigh. He arrives today with fairly good edema control 12/21/16; patient  is here for follow-up of an extensive wound on the right lateral leg below the knee as well as the right lower abdominal pannus and right upper thigh wound. We have been using Hydrofera Blue to the right leg wound and silver alginate with interdry to the pannus wounds which appear to be making good progress. 01/05/17; extensive area on the right lateral leg below the knee. No major change in this. Thick adherent necrotic surface. We have been using Hydrofera Blue without apparent improvement. He also has wounds on his pannus both on the abdomen and the thigh not much change from last time we have been using silver alginate here 01/19/17- He is here for follow up evaluation. He states that his glucose levels have been fluctuating between 150-200; this morning fasting level 162. He is voicing no concerns, complaints, no fever/chills. 02/02/17; as far as his wounds are concerned things are not going particularly well. He has a new wound in the pannus medially to the original wound. He arrives today with this area moist malodorous. The area on the right lateral leg some improved although he still has a deeper satellite area underneath the original wound. 02/16/17. 2 week visit using Iodoflex on the leg wounds and silver alginate on his pannus wounds. 03/09/17; haven't seen this man in about 3 weeks. His abdominal/upper thigh pannus wounds look a lot better using silver alginate right lateral leg wound perhaps some visual improvement in terms of dimensions but the dimensions and measurements or not that different. 04/27/17; seen by Dr. Con Memos while I was oriented vacation early last month. Using [facility] with continued improvement in the conditions of the wound bed. He is using silver alginate to the wounds in the abdominal/thigh pannus 06/01/17; patient has not been seen here in over a month. He is been using Hydrofera Blue with continued improvement in the conditions of the wound bed on the right  lateral leg. He uses silver alginate to the abdominal/thigh pannus area with inter-dry and rolled gauze 06/15/17; two-week follow-up appointment. Large wound on the right lateral leg predominantly venous insufficiency. He has a smaller satellite lesion under that wound. These have been making progress with Hydrofera Blue ooHe has to pannus wounds one on the lower abdomen one on the upper right thigh. 06/29/17; 2 week follow-up appointment. He tells me that he had cellulitis on the lower abdominal  pannus/lymphedema. This was treated by phone with phone and antibiotics by his primary physician using Keflex 500 mg by mouth every 6. ooLarge wound on the right lateral thigh we've been using Hydrofera Blue. This is venous insufficiency wound. Smaller lesion under the wound. We have much better surfaces with the Hydrofera Blue ooPannus wounds on the abdominal and thigh on the right 07/27/17; with regards to his right lateral leg he has a "Coke bottle" shaped wound on the left lateral leg this appears to be somewhat better perhaps slightly smaller, certainly a better-looking surface. He has satellite wounds underneath this with adherent necrotic debris and a new small wound laterally. He also has 4 small open areas anteriorly on the left leg which may be a wrap injury or scissor injury His pannus wounds are really about the same. he has Interdry. However I don't believe this is offloading this enough or keeping this dry enough 08/10/17; not much change in any of these wounds perhaps slightly better and the major wounds superiorly and slightly worse in the satellite lesion inferior to the major wound. He also has a second satellite lesion medially. We have been using [facility] tells me he is being more aggressive offloading the wounds on the right pannus both in the lower abdomen and anterior thigh seen silver alginate here 08/24/17; lateral right leg wound looks about the same a bit  disappointing given the aggressive debridement I did 2 weeks ago. The satellite lesion underneath this is worse and requires debridement ooHe has the usual wounds on the inferior part of his pannus and the underlying thigh however deep in the recess there is new open areas here o3 measured as a small cluster 09/07/17; lateral right leg wound had some necrotic debris in the mid aspect. Our intake nurse noted what appears to be striated muscle although I am not exactly sure of this after this visit. It did not move with flexion and extension. I been assuming that this is a a venous ulcer associated with venous inflammation and secondary lymphedema. He also has an enlarging wound which was the satellite lesion underneath this. Finally he has the area under his abdominal pannus and the area of his thigh under the abdominal pannus on the right. We have been using silver alginate 09/28/17; the lateral right leg bottle shaped wound is about the same. What started as a small satellite lesion below this is rapidly expanded and I biopsied this. I had previously biopsied the large wound itself in late 2017. His pannus wounds look better. We have use silver alginate for a long period 10/12/17; the lateral right leg bottle-shaped wounds superiorly is about the samestill adherent and slough covered.. The satellite area inferiorly has copious amounts of necrotic material. Culture I did last time showed Pseudomonassensitive to ciprofloxacin and he is finishing this now. Tolerating it well. Biopsy I did did not show any malignancy or other abnormalities. Suggestive of a chronic venous ulcer 10/25/17; the lateral right leg bottle shaped wound continues to be about the same. At the bottom this is beginning to cold less with what was a satellite lesion underneath it I suspect both of these will merge at some point. He has what appears to be protruding muscle through the middle part of the wound. I've been attempting to  put enough compression on this area to counteract that. He has lymphedema and I wonder about compression pumps. He has [employer] 11/22/17; chronic venous insufficiency with inflammation and lymphedema. The area  on the right calf is original wound is may be slightly smaller however the wounds have now continued down his leg and now has a new one on the lateral malleolus and 2 new areas on the anterior calf. Looking through his records I don't see arterial insufficiency although his peripheral pulses are palpable. He also has wounds under the abdominal pannus and on the front of his right thigh. We are using silver alginate absorbent dressings in this area 12/20/17; chronic venous insufficiency with inflammation and lymphedema. The patient has 3 separate wounds on the right lateral calf. This goes from just below his knee down to the right lateral malleolus. He has a new wound medially on the right calf. Several excoriated areas anteriorly. We have been using silver alginate, the wound area makes any other type of dressing difficult simply because of the volume of open areas. His nephew changes his wrap and per our intake nurse he does a good job. He does not have compression pumps. 01/10/18; chronic venous insufficiency with inflammation and lymphedema. He has wounds right along the right lateral calf. Also wounds on the upper abdomen under the pannus and the anterior right thigh. We've been using Fibracol. He has had some improvement in the superior part of the leg wound. He uses 4 layer compression that his nephew puts on him and he apparently doesn't good job. He received a phone call to say that his insurance would not cover compression pumps 02/07/18; chronic venous insufficiency with inflammation and lymphedema. He is not eligible for compression pumps through his insurance. We are using 4 layer compression. He has had some improvement in the inferior part of the large wound on his left  lateral leg but some deterioration in the superior aspect. There was some drainage coming out of this. I didn't see any evidence of infection 03/07/18; chronic venous insufficiency with inflammation and lymphedema. We now think there may be an avenue to get compression pumps covered through his insurance. We are using 4 layer compression. We are using Furber call to the large wound area which I think are looking some better in the dimensions are better. Some of the smaller wounds on the anterior right leg healed. The area between the large abdominal pannus and the anterior right thigh is worse. The wound on the thigh is worse. There is surrounding fungal dermatitis which is probably Candida intertrigo. The areas reasonably substantial 04/18/18; this patient I have not seen in about a month and a half. He tells me since he is been here he had fairly extensive cellulitis involving his lower abdominal pannus and anterior right thigh. Wounds in this area are therefore today quite a bit worse. He also has had worsening of the large wounds along his lateral right calf which as I remember things were quite a bit better 6 weeks ago. He does have his lymphedema pumps apparently on approval status but still not delivered to his home. We have been using Fibracol. Limited amount of dressings we can use secondary to the substantial overall wound area 05/16/2018; monthly follow-up. The patient has obtained his external compression pumps and use them 4 times. There is a remarkable improvement in the amount of edema in the right lower leg and some improvement in the generalized erythema especially on the right lateral calf. However he continues to have a substantial wound area basically from just around the lateral malleolus up to his fibular head. He seems to tolerate this remarkably well. We have been using  purachol 06/13/2018 monthly follow-up. Patient states he uses his compression pumps once a day sometimes  twice. He has a lot of improvement in the edema but still a colossal full-thickness wound on the right lateral calf all the way from the ankle to just below the knee. ooShe also has a new area medially in the lower abdominal pannus ooWe have been using silver alginate on the abdominal pannus/thigh wounds and pure call on the right lateral calf. He uses ABD pads to try and push the bulging muscle back into the calf area I certainly encourage this 07/04/2018; patient is using compression pumps at home. A lot of improvement in the edema but still a colossal full- thickness wound on the right lateral calf from the ankle to the knee. This has exposed muscle. ooShe has 3 areas on the pannus of his abdomen to on the abdominal side and one on the right anterior thigh we have been using silver alginate 08/01/2018. Large wound on the right lateral calf and ankle. Things look actually stable here. Surface is stable he has some epithelialization. Edema control is adequate using compression pumps 1-2 times per day ooIn the large abdominal folds he still has the area on the anterior thigh he has 3 wounds on the pannus we are using silver alginate 08/30/2018; the large wound on his right lateral calf and ankle. Miraculously looks some better. He has large rims of skin coming inferiorly distally and more proximally superiorly. This really looks like it is doing well. Macerated wounds on his pannus all about the same. There are 3 wounds 2 on the thigh one on the abdomen. Areas moist. They have been using silver alginate 2/7/; large anterior calf wounds on the right lateral calf and ankle. He has been using Fibracol. Arrives today with a very adherent necrotic surface that I could not wash off with Anasept. No change in the pannus wounds using silver alginate here 3/6; large wounds on the right lateral calf and ankle. Actually has a better looking surface. Furthermore some epithelialization. This is a very  large wound but appears to be making some progress there using silver alginate He has 3 wounds. In the abdominal pannus against his thigh one on the lower abdomen, one right in the inguinal area and one on the anterior thigh. These areas are wet and macerated. Simply too much pressure here 4/13; telehealth visit out of concerns for the Covid epidemic. We did not have clear images. The patient is using silver alginate but he ran out of this and is using Hydrofera Blue on his leg and silver alginate on the wounds on his thigh and pannus area on the right. 5/7; some improvement in the right lateral leg wound using silver alginate however he has 2 large areas on the lower abdominal pannus one on the right anterior thigh that really are larger and remain in constantly warm macerated situation. 6/12; the area on the right lateral leg perhaps somewhat less in terms of width but the entire wound surface is covered with a very adherent debris which I think inhibits any degree of epithelialization. We have been using silver alginate to this large wound area, there is not much else to choose from. We did try Fibracol for a while. This did not really help. ooHe has the areas on the lower abdominal pannus and upper thigh we have simply been using drawtex to this and things look somewhat better today. 7/27; no change in the right lateral leg wound covered  in very adherent surface slough. The areas on the abdomen and pannus and upper thigh on the right actually look better. We have been using silver alginate to all wounds. 8/27; right lateral leg wound is 100% slough covered very adherent. The area on the abdomen and pannus upper thigh all look better. We have been using just drawtex on these areas. He is using his external compression pumps at home 10/15; right lateral leg wound is 100% slough covered very adherent. We have been using Mehdi honey. This is inexpensive somewhat antibacterial. It does not seem  to be making any headway as with the amount of eschar here. I can no longer debride him in this clinic. There is simply too much bleeding. He is not on blood thinners. I suspect this may be severe venous hypertension related to cor pulmonale/obesity hypoventilation syndrome 08/28/2019 TELEHEALTH visit. I saw this patient via telehealth visit today. I am accompanied by her clinical nurse and the patient had his care attendant who is his nephew. We have been using Medihoney with alginate to the large area on the right leg he is also using Medihoney to the pannus wounds. These have been going really quite well according to the nephew. 09/25/2019 TELEHEALTH visit. I saw this patient via telehealth today. He was with his nephew who we know and I was in the accompaniment of our nurse. They have been using Medihoney predominantly. Miraculously it looks as though the area on the right leg is a lot better. Difficult to get enough like to be certain whether he has epithelialization distally versus a adherent surface slough. His pannus wounds however are considerably better there is not any doubt about that. His nephew wraps his leg and 4-layer compression 4/5 TELEHEALTH visit. The patient was seen by telehealth today accompanied by his nephew and on our end our case manager. It is difficult to tell under the illumination we have however there may be some epithelialization. Whether this is adherent nonviable surface I cannot tell on this type of visit. His area on the right anterior thigh is healed and the areas under the right pannus look better Objective Integumentary (Hair, Skin) Wound #14 status is Healed - Epithelialized. Original cause of wound was Gradually Appeared. The wound is located on the Right Abdomen - Lower Quadrant. The wound measures 0cm length x 0cm width x 0cm depth; 0cm^2 area and 0cm^3 volume. There is no tunneling or undermining noted. There is a none present amount of drainage  noted. The wound margin is epibole. There is no granulation within the wound bed. There is no necrotic tissue within the wound bed. Wound #30 status is Healed - Epithelialized. Original cause of wound was Gradually Appeared. The wound is located on the Right,Medial Abdomen - Lower Quadrant. The wound measures 0cm length x 0cm width x 0cm depth; 0cm^2 area and 0cm^3 volume. There is no tunneling or undermining noted. There is a none present amount of drainage noted. The wound margin is flat and intact. There is no granulation within the wound bed. There is no necrotic tissue within the wound bed. Wound #9 status is Open. Original cause of wound was Gradually Appeared. The wound is located on the Right,Lateral Lower Leg. The wound measures 37cm length x 22cm width x 0.3cm depth; 639.314cm^2 area and 191.794cm^3 volume. There is Fat Layer (Subcutaneous Tissue) Exposed exposed. There is no tunneling or undermining noted. There is a large amount of serosanguineous drainage noted. The wound margin is distinct with the outline  attached to the wound base. There is medium (34-66%) red, pink granulation within the wound bed. There is a medium (34-66%) amount of necrotic tissue within the wound bed including Adherent Slough. 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 Dressing Change Frequency: Wound #9 Right,Lateral Lower Leg: Change Dressing every  other day. Skin Barriers/Peri-Wound Care: Wound #9 Right,Lateral Lower Leg: Barrier cream Moisturizing lotion Wound Cleansing: Wound #9 Right,Lateral Lower Leg: May shower with protection. - do not get right leg wet Other: - Clean all wounds with gauze and Anasept Primary Wound Dressing: Wound #9 Right,Lateral Lower Leg: Medihoney Alginate - sheets apply to wound bed. Secondary Dressing: Wound #9 Right,Lateral Lower Leg: ABD pad - Roll and layer ABDs under compression wrap to apply pressure to muscle. Zetuvit or Kerramax Edema Control: 4 layer compression - Right Lower Extremity Avoid standing for long periods of time Elevate legs to the level of the heart or above for 30 minutes daily and/or when sitting, a frequency of: - throughout the day Segmental Compressive Device. - Lymphedema pumps twice a day for 1 hour each 1. We continued with the Medihoney alginate he has been using with some success 2. Would dictate bringing him into the clinic to really know how much of what we are seeing is a viable surface on the substantial wound on his right anterior leg. 3. Covering the Medihoney alginate with Kerramax under 4-layer compression. 4 the area under the large abdominal pannus is considerably better Electronic Signature(s) Signed: 12/15/2019 5:40:04 PM By: Linton Ham MD Entered By: Linton Ham on 12/15/2019 09:32:07 -------------------------------------------------------------------------------- SuperBill Details Patient Name: Date of Service: [name] 11/24/2019 Medical Record [id number] Patient Account Number: [id number] Date of Birth/Sex: Treating RN: 09/06/1960 (59 y.o. 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 Modifier: Quantity: 1 Physician Procedures CPT4 Code Description: [id number] - WC PHYS LEVEL 2 - EST PT ICD-10 Diagnosis Description G26.948 Non-pressure chronic ulcer of right calf with necrosis of E08.622 Diabetes mellitus due to underlying condition with other Modifier: muscle skin ulcer Quantity: 1 Electronic Signature(s) Signed: 12/15/2019 5:40:04 PM By: Linton Ham MD Previous Signature: 11/24/2019 5:09:30 PM Version By: Levan Hurst RN, BSN Previous Signature: 11/24/2019 5:24:04 PM Version By: Linton Ham MD Entered By: Linton Ham on 12/15/2019 09:32:35

## 2019-11-24 NOTE — Progress Notes (Signed)
NASH, BOLLS ([id number]) Visit Report for 11/24/2019 Wound Assessment Details Patient Name: Date of Service: [name], [name] 11/24/2019 1:15 PM Medical Record [id number] Patient Account Number: [id number] Date of Birth/Sex: Treating RN: Aug 04, 1961 (59 y.o. [name] Primary Care Sinjin Amero: Margarita Rana Other Clinician: Referring Modelle Vollmer: Treating Aleina Burgio/Extender:Robson, Stefan Church, Elon Alas in Treatment: 191 Wound Status Wound Number: 14 Primary Atypical Etiology: Wound Location: Right Abdomen - Lower Quadrant Wound Healed - Epithelialized Wounding Event: Gradually Appeared Status: Date Acquired: 04/23/2016 Comorbid Anemia, Lymphedema, Sleep Apnea, Weeks Of Treatment: 186 History: Arrhythmia, Congestive Heart Failure, Clustered Wound: Yes Hypertension, Peripheral Venous Disease, Type II Diabetes, Gout, Osteoarthritis, Neuropathy, Confinement Anxiety Wound Measurements Length: (cm) 0 % Reduction in Area: 100% Width: (cm) 0 % Reduction in Volume: 100% Depth: (cm) 0 Epithelialization: Large (67-100%) Clustered Quantity: 1 Tunneling: No Area: (cm) 0 Undermining: No Volume: (cm) 0 Wound Description Classification: Full Thickness Without Exposed Support Foul Odor After Cleansing: No Structures Slough/Fibrino No Wound Epibole Margin: Exudate None Present Amount: Wound Bed Granulation Amount: None Present (0%) Exposed Structure Necrotic Amount: None Present (0%) Fascia Exposed: No Fat Layer (Subcutaneous Tissue) Exposed: No Tendon Exposed: No Muscle Exposed: No Joint Exposed: No Bone Exposed: No Electronic Signature(s) Signed: 11/24/2019 5:09:30 PM By: Levan Hurst RN, BSN Entered By: Levan Hurst on 11/24/2019 14:12:49 -------------------------------------------------------------------------------- Wound Assessment Details Patient Name: Date of Service: [name] 11/24/2019 1:15 PM Medical Record  [id number] Patient Account Number: [id number] Date of Birth/Sex: Treating RN: [birth year] (59 y.o. [name] Primary Care Burnie Therien: Margarita Rana Other Clinician: Referring Trentyn Boisclair: Treating Thedford Bunton/Extender:Robson, Stefan Church, Elon Alas in Treatment: 191 Wound Status Wound Number: 30 Primary MASD Moisture Associated Skin Damage Etiology: Wound Location: Right, Medial Abdomen - Lower Quadrant Wound Healed - Epithelialized Status: Wounding Event: Gradually Appeared Comorbid Anemia, Lymphedema, Sleep Apnea, Date Acquired: 06/13/2018 History: Arrhythmia, Congestive Heart Failure, Weeks Of Treatment: 75 Hypertension, Peripheral Venous Disease, Clustered Wound: No Type II Diabetes, Gout, Osteoarthritis, Neuropathy, Confinement Anxiety Wound Measurements Length: (cm) 0 % Reduction Width: (cm) 0 % Reduction Depth: (cm) 0 Epithelializ Area: (cm) 0 Tunneling: Volume: (cm) 0 Undermining Wound Description Wound Margin: Flat and Intact Exudate Amount: None Present Foul Odor After Cleansing: No Slough/Fibrino No in Area: 100% in Volume: 100% ation: Large (67-100%) No : No Wound Bed Granulation Amount: None Present (0%) Exposed Structure Necrotic Amount: None Present (0%) Fascia Exposed: No Fat Layer (Subcutaneous Tissue) Exposed: No Tendon Exposed: No Muscle Exposed: No Joint Exposed: No Bone Exposed: No Electronic Signature(s) Signed: 11/24/2019 5:09:30 PM By: Levan Hurst RN, BSN Entered By: Levan Hurst on 11/24/2019 14:13:04 -------------------------------------------------------------------------------- Wound Assessment Details Patient Name: Date of Service: [name] 11/24/2019 1:15 PM Medical Record [id number] Patient Account Number: [id number] Date of Birth/Sex: Treating RN: Jun 06, 1961 (59 y.o. [name] Primary Care Kimberley Dastrup: Margarita Rana Other Clinician: Referring Hansika Leaming: Treating  Cher Egnor/Extender:Robson, Stefan Church, Elon Alas in Treatment: 191 Wound Status Wound Number: 9 Primary Diabetic Wound/Ulcer of the Lower Extremity Etiology: Wound Location: Right, Lateral Lower Leg Wound Open Wounding Event: Gradually Appeared Status: Date Acquired: 03/10/2016 Comorbid Anemia, Lymphedema, Sleep Apnea, Weeks Of Treatment: 191 History: Arrhythmia, Congestive Heart Failure, Clustered Wound: Yes Hypertension, Peripheral Venous Disease, Type II Diabetes, Gout, Osteoarthritis, Neuropathy, Confinement Anxiety Wound Measurements Length: (cm) 37 % Reductio Width: (cm) 22 % Reductio Depth: (cm) 0.3 Epithelial Area: (cm) 639.314 Tunneling Volume: (cm) 191.794 Undermini Wound Description Classification: Grade 2 Wound Margin: Distinct, outline attached Exudate Amount: Large  Exudate Type: Serosanguineous Exudate Color: red, brown Wound Bed Granulation Amount: Medium (34-66%) Granulation Quality: Red, Pink Necrotic Amount: Medium (34-66%) Necrotic Quality: Adherent Slough Foul Odor After Cleansing: No Slough/Fibrino Yes Exposed Structure Fascia Exposed: No Fat Layer (Subcutaneous Tissue) Exposed: Yes Tendon Exposed: No Muscle Exposed: No Joint Exposed: No Bone Exposed: No n in Area: -41630.7% n in Volume: -125255.6% ization: Small (1-33%) : No ng: No Electronic Signature(s) Signed: 11/24/2019 5:09:30 PM By: Levan Hurst RN, BSN Entered By: Levan Hurst on 11/24/2019 14:13:24

## 2019-12-02 ENCOUNTER — Telehealth: Payer: Self-pay | Admitting: Orthopaedic Surgery

## 2019-12-02 DIAGNOSIS — G8929 Other chronic pain: Secondary | ICD-10-CM

## 2019-12-02 DIAGNOSIS — M25562 Pain in left knee: Secondary | ICD-10-CM

## 2019-12-02 MED ORDER — OXYCODONE HCL 15 MG PO TABS: ORAL_TABLET | ORAL | 0 refills | Status: AC

## 2019-12-02 NOTE — Telephone Encounter (Signed)
Done

## 2019-12-02 NOTE — Telephone Encounter (Signed)
Noted  

## 2019-12-02 NOTE — Telephone Encounter (Signed)
Patient called for refill / aware he has been unable to return to office for injections since his fall: oxyCODONE (ROXICODONE) 15 MG immediate release tablet 160 tablet  -[facility], [employer]

## 2019-12-04 ENCOUNTER — Emergency Department (HOSPITAL_COMMUNITY): Payer: Medicare HMO

## 2019-12-04 ENCOUNTER — Other Ambulatory Visit: Payer: Self-pay

## 2019-12-04 ENCOUNTER — Encounter (HOSPITAL_COMMUNITY): Payer: Self-pay

## 2019-12-04 ENCOUNTER — Inpatient Hospital Stay (HOSPITAL_COMMUNITY)
Admission: EM | Admit: 2019-12-04 | Discharge: 2019-12-20 | DRG: 871 | Disposition: E | Payer: Medicare HMO | Attending: Family Medicine | Admitting: Family Medicine

## 2019-12-04 DIAGNOSIS — S81801A Unspecified open wound, right lower leg, initial encounter: Secondary | ICD-10-CM

## 2019-12-04 DIAGNOSIS — I471 Supraventricular tachycardia: Secondary | ICD-10-CM | POA: Diagnosis not present

## 2019-12-04 DIAGNOSIS — I469 Cardiac arrest, cause unspecified: Secondary | ICD-10-CM | POA: Diagnosis present

## 2019-12-04 DIAGNOSIS — Z6841 Body Mass Index (BMI) 40.0 and over, adult: Secondary | ICD-10-CM | POA: Diagnosis not present

## 2019-12-04 DIAGNOSIS — E11622 Type 2 diabetes mellitus with other skin ulcer: Secondary | ICD-10-CM | POA: Diagnosis present

## 2019-12-04 DIAGNOSIS — A419 Sepsis, unspecified organism: Principal | ICD-10-CM | POA: Diagnosis present

## 2019-12-04 DIAGNOSIS — N184 Chronic kidney disease, stage 4 (severe): Secondary | ICD-10-CM | POA: Diagnosis present

## 2019-12-04 DIAGNOSIS — Z9981 Dependence on supplemental oxygen: Secondary | ICD-10-CM | POA: Diagnosis not present

## 2019-12-04 DIAGNOSIS — G4733 Obstructive sleep apnea (adult) (pediatric): Secondary | ICD-10-CM | POA: Diagnosis present

## 2019-12-04 DIAGNOSIS — Z79899 Other long term (current) drug therapy: Secondary | ICD-10-CM

## 2019-12-04 DIAGNOSIS — E1122 Type 2 diabetes mellitus with diabetic chronic kidney disease: Secondary | ICD-10-CM | POA: Diagnosis present

## 2019-12-04 DIAGNOSIS — I10 Essential (primary) hypertension: Secondary | ICD-10-CM

## 2019-12-04 DIAGNOSIS — R739 Hyperglycemia, unspecified: Secondary | ICD-10-CM | POA: Diagnosis present

## 2019-12-04 DIAGNOSIS — L03115 Cellulitis of right lower limb: Secondary | ICD-10-CM | POA: Diagnosis present

## 2019-12-04 DIAGNOSIS — I130 Hypertensive heart and chronic kidney disease with heart failure and stage 1 through stage 4 chronic kidney disease, or unspecified chronic kidney disease: Secondary | ICD-10-CM | POA: Diagnosis present

## 2019-12-04 DIAGNOSIS — L089 Local infection of the skin and subcutaneous tissue, unspecified: Secondary | ICD-10-CM

## 2019-12-04 DIAGNOSIS — L0390 Cellulitis, unspecified: Secondary | ICD-10-CM | POA: Diagnosis present

## 2019-12-04 DIAGNOSIS — E1165 Type 2 diabetes mellitus with hyperglycemia: Secondary | ICD-10-CM | POA: Diagnosis present

## 2019-12-04 DIAGNOSIS — Z791 Long term (current) use of non-steroidal anti-inflammatories (NSAID): Secondary | ICD-10-CM

## 2019-12-04 DIAGNOSIS — E1159 Type 2 diabetes mellitus with other circulatory complications: Secondary | ICD-10-CM | POA: Diagnosis not present

## 2019-12-04 DIAGNOSIS — T148XXA Other injury of unspecified body region, initial encounter: Secondary | ICD-10-CM | POA: Diagnosis not present

## 2019-12-04 DIAGNOSIS — E785 Hyperlipidemia, unspecified: Secondary | ICD-10-CM | POA: Diagnosis not present

## 2019-12-04 DIAGNOSIS — L97919 Non-pressure chronic ulcer of unspecified part of right lower leg with unspecified severity: Secondary | ICD-10-CM | POA: Diagnosis present

## 2019-12-04 DIAGNOSIS — R632 Polyphagia: Secondary | ICD-10-CM | POA: Diagnosis present

## 2019-12-04 DIAGNOSIS — M793 Panniculitis, unspecified: Secondary | ICD-10-CM | POA: Diagnosis present

## 2019-12-04 DIAGNOSIS — E039 Hypothyroidism, unspecified: Secondary | ICD-10-CM | POA: Diagnosis present

## 2019-12-04 DIAGNOSIS — J9621 Acute and chronic respiratory failure with hypoxia: Secondary | ICD-10-CM | POA: Diagnosis present

## 2019-12-04 DIAGNOSIS — Z9861 Coronary angioplasty status: Secondary | ICD-10-CM | POA: Diagnosis not present

## 2019-12-04 DIAGNOSIS — Z20822 Contact with and (suspected) exposure to covid-19: Secondary | ICD-10-CM | POA: Diagnosis present

## 2019-12-04 DIAGNOSIS — D72829 Elevated white blood cell count, unspecified: Secondary | ICD-10-CM | POA: Diagnosis present

## 2019-12-04 DIAGNOSIS — I472 Ventricular tachycardia, unspecified: Secondary | ICD-10-CM

## 2019-12-04 DIAGNOSIS — L03116 Cellulitis of left lower limb: Secondary | ICD-10-CM | POA: Diagnosis present

## 2019-12-04 DIAGNOSIS — N179 Acute kidney failure, unspecified: Secondary | ICD-10-CM | POA: Diagnosis present

## 2019-12-04 DIAGNOSIS — E875 Hyperkalemia: Secondary | ICD-10-CM | POA: Diagnosis present

## 2019-12-04 DIAGNOSIS — I5032 Chronic diastolic (congestive) heart failure: Secondary | ICD-10-CM | POA: Diagnosis present

## 2019-12-04 DIAGNOSIS — M1990 Unspecified osteoarthritis, unspecified site: Secondary | ICD-10-CM | POA: Diagnosis present

## 2019-12-04 DIAGNOSIS — E1169 Type 2 diabetes mellitus with other specified complication: Secondary | ICD-10-CM | POA: Diagnosis not present

## 2019-12-04 DIAGNOSIS — K640 First degree hemorrhoids: Secondary | ICD-10-CM | POA: Diagnosis present

## 2019-12-04 DIAGNOSIS — B372 Candidiasis of skin and nail: Secondary | ICD-10-CM | POA: Diagnosis present

## 2019-12-04 DIAGNOSIS — R079 Chest pain, unspecified: Secondary | ICD-10-CM | POA: Clinically undetermined

## 2019-12-04 DIAGNOSIS — D631 Anemia in chronic kidney disease: Secondary | ICD-10-CM | POA: Diagnosis present

## 2019-12-04 DIAGNOSIS — L97929 Non-pressure chronic ulcer of unspecified part of left lower leg with unspecified severity: Secondary | ICD-10-CM | POA: Diagnosis present

## 2019-12-04 DIAGNOSIS — R6521 Severe sepsis with septic shock: Secondary | ICD-10-CM | POA: Diagnosis present

## 2019-12-04 DIAGNOSIS — S71102A Unspecified open wound, left thigh, initial encounter: Secondary | ICD-10-CM

## 2019-12-04 DIAGNOSIS — F329 Major depressive disorder, single episode, unspecified: Secondary | ICD-10-CM | POA: Diagnosis present

## 2019-12-04 DIAGNOSIS — D509 Iron deficiency anemia, unspecified: Secondary | ICD-10-CM | POA: Diagnosis present

## 2019-12-04 DIAGNOSIS — N183 Chronic kidney disease, stage 3 unspecified: Secondary | ICD-10-CM

## 2019-12-04 DIAGNOSIS — Z794 Long term (current) use of insulin: Secondary | ICD-10-CM

## 2019-12-04 DIAGNOSIS — Z79890 Hormone replacement therapy (postmenopausal): Secondary | ICD-10-CM | POA: Diagnosis not present

## 2019-12-04 DIAGNOSIS — I4901 Ventricular fibrillation: Secondary | ICD-10-CM | POA: Diagnosis not present

## 2019-12-04 DIAGNOSIS — I462 Cardiac arrest due to underlying cardiac condition: Secondary | ICD-10-CM | POA: Diagnosis not present

## 2019-12-04 DIAGNOSIS — K219 Gastro-esophageal reflux disease without esophagitis: Secondary | ICD-10-CM | POA: Diagnosis present

## 2019-12-04 DIAGNOSIS — E559 Vitamin D deficiency, unspecified: Secondary | ICD-10-CM | POA: Diagnosis present

## 2019-12-04 DIAGNOSIS — M25561 Pain in right knee: Secondary | ICD-10-CM | POA: Diagnosis present

## 2019-12-04 LAB — URINALYSIS, ROUTINE W REFLEX MICROSCOPIC
Bilirubin Urine: NEGATIVE
Glucose, UA: 50 mg/dL — AB
Hgb urine dipstick: NEGATIVE
Ketones, ur: NEGATIVE mg/dL
Leukocytes,Ua: NEGATIVE
Nitrite: NEGATIVE
Protein, ur: 30 mg/dL — AB
Specific Gravity, Urine: 1.014 (ref 1.005–1.030)
pH: 5 (ref 5.0–8.0)

## 2019-12-04 LAB — CBC WITH DIFFERENTIAL/PLATELET
Abs Immature Granulocytes: 0.25 10*3/uL — ABNORMAL HIGH (ref 0.00–0.07)
Basophils Absolute: 0.1 10*3/uL (ref 0.0–0.1)
Basophils Relative: 1 %
Eosinophils Absolute: 0.2 10*3/uL (ref 0.0–0.5)
Eosinophils Relative: 1 %
HCT: 26.4 % — ABNORMAL LOW (ref 39.0–52.0)
Hemoglobin: 7.5 g/dL — ABNORMAL LOW (ref 13.0–17.0)
Immature Granulocytes: 1 %
Lymphocytes Relative: 4 %
Lymphs Abs: 0.9 10*3/uL (ref 0.7–4.0)
MCH: 26.9 pg (ref 26.0–34.0)
MCHC: 28.4 g/dL — ABNORMAL LOW (ref 30.0–36.0)
MCV: 94.6 fL (ref 80.0–100.0)
Monocytes Absolute: 3 10*3/uL — ABNORMAL HIGH (ref 0.1–1.0)
Monocytes Relative: 13 %
Neutro Abs: 18.7 10*3/uL — ABNORMAL HIGH (ref 1.7–7.7)
Neutrophils Relative %: 80 %
Platelets: 357 10*3/uL (ref 150–400)
RBC: 2.79 MIL/uL — ABNORMAL LOW (ref 4.22–5.81)
RDW: 15.9 % — ABNORMAL HIGH (ref 11.5–15.5)
WBC: 23.2 10*3/uL — ABNORMAL HIGH (ref 4.0–10.5)
nRBC: 0 % (ref 0.0–0.2)

## 2019-12-04 LAB — BASIC METABOLIC PANEL
Anion gap: 8 (ref 5–15)
BUN: 57 mg/dL — ABNORMAL HIGH (ref 6–20)
CO2: 23 mmol/L (ref 22–32)
Calcium: 8.4 mg/dL — ABNORMAL LOW (ref 8.9–10.3)
Chloride: 104 mmol/L (ref 98–111)
Creatinine, Ser: 3.32 mg/dL — ABNORMAL HIGH (ref 0.61–1.24)
GFR calc Af Amer: 23 mL/min — ABNORMAL LOW (ref >60)
GFR calc non Af Amer: 20 mL/min — ABNORMAL LOW (ref >60)
Glucose, Bld: 160 mg/dL — ABNORMAL HIGH (ref 70–99)
Potassium: 5.8 mmol/L — ABNORMAL HIGH (ref 3.5–5.1)
Sodium: 135 mmol/L (ref 135–145)

## 2019-12-04 LAB — ETHANOL: Alcohol, Ethyl (B): <10 mg/dL (ref <10)

## 2019-12-04 LAB — COMPREHENSIVE METABOLIC PANEL
ALT: 12 U/L (ref 0–44)
AST: 19 U/L (ref 15–41)
Albumin: 2.5 g/dL — ABNORMAL LOW (ref 3.5–5.0)
Alkaline Phosphatase: 61 U/L (ref 38–126)
Anion gap: 10 (ref 5–15)
BUN: 59 mg/dL — ABNORMAL HIGH (ref 6–20)
CO2: 21 mmol/L — ABNORMAL LOW (ref 22–32)
Calcium: 8.4 mg/dL — ABNORMAL LOW (ref 8.9–10.3)
Chloride: 103 mmol/L (ref 98–111)
Creatinine, Ser: 3.3 mg/dL — ABNORMAL HIGH (ref 0.61–1.24)
GFR calc Af Amer: 23 mL/min — ABNORMAL LOW (ref >60)
GFR calc non Af Amer: 20 mL/min — ABNORMAL LOW (ref >60)
Glucose, Bld: 203 mg/dL — ABNORMAL HIGH (ref 70–99)
Potassium: 6.3 mmol/L (ref 3.5–5.1)
Sodium: 134 mmol/L — ABNORMAL LOW (ref 135–145)
Total Bilirubin: 0.9 mg/dL (ref 0.3–1.2)
Total Protein: 6.6 g/dL (ref 6.5–8.1)

## 2019-12-04 LAB — RAPID URINE DRUG SCREEN, HOSP PERFORMED
Amphetamines: NOT DETECTED
Barbiturates: NOT DETECTED
Benzodiazepines: POSITIVE — AB
Cocaine: NOT DETECTED
Opiates: NOT DETECTED
Tetrahydrocannabinol: NOT DETECTED

## 2019-12-04 LAB — TROPONIN I (HIGH SENSITIVITY)
Troponin I (High Sensitivity): 11 ng/L (ref <18)
Troponin I (High Sensitivity): 12 ng/L (ref <18)

## 2019-12-04 LAB — IRON AND TIBC
Iron: 15 ug/dL — ABNORMAL LOW (ref 45–182)
Saturation Ratios: 3 % — ABNORMAL LOW (ref 17.9–39.5)
TIBC: 450 ug/dL (ref 250–450)
UIBC: 435 ug/dL

## 2019-12-04 LAB — PROCALCITONIN: Procalcitonin: 0.25 ng/mL

## 2019-12-04 LAB — LACTIC ACID, PLASMA
Lactic Acid, Venous: 1.3 mmol/L (ref 0.5–1.9)
Lactic Acid, Venous: 1.1 mmol/L (ref 0.5–1.9)

## 2019-12-04 LAB — RETICULOCYTES
Immature Retic Fract: 29.9 % — ABNORMAL HIGH (ref 2.3–15.9)
RBC.: 2.75 MIL/uL — ABNORMAL LOW (ref 4.22–5.81)
Retic Count, Absolute: 96.3 10*3/uL (ref 19.0–186.0)
Retic Ct Pct: 3.5 % — ABNORMAL HIGH (ref 0.4–3.1)

## 2019-12-04 LAB — RESPIRATORY PANEL BY RT PCR (FLU A&B, COVID)
Influenza A by PCR: NEGATIVE
Influenza B by PCR: NEGATIVE
SARS Coronavirus 2 by RT PCR: NEGATIVE

## 2019-12-04 LAB — CBG MONITORING, ED: Glucose-Capillary: 142 mg/dL — ABNORMAL HIGH (ref 70–99)

## 2019-12-04 LAB — VITAMIN B12: Vitamin B-12: 7200 pg/mL — ABNORMAL HIGH (ref 180–914)

## 2019-12-04 LAB — FOLATE: Folate: 5.4 ng/mL — ABNORMAL LOW (ref >5.9)

## 2019-12-04 LAB — PROTIME-INR
INR: 1.3 — ABNORMAL HIGH (ref 0.8–1.2)
Prothrombin Time: 15.8 seconds — ABNORMAL HIGH (ref 11.4–15.2)

## 2019-12-04 LAB — FERRITIN: Ferritin: 40 ng/mL (ref 24–336)

## 2019-12-04 LAB — APTT: aPTT: 32 seconds (ref 24–36)

## 2019-12-04 LAB — BRAIN NATRIURETIC PEPTIDE: B Natriuretic Peptide: 735 pg/mL — ABNORMAL HIGH (ref 0.0–100.0)

## 2019-12-04 MED ORDER — FERROUS SULFATE 325 (65 FE) MG PO TABS
325.0000 mg | ORAL_TABLET | Freq: Two times a day (BID) | ORAL | Status: DC
  Administered 2019-12-05 – 2019-12-08 (×8): 325 mg via ORAL
  Filled 2019-12-04 (×8): qty 1

## 2019-12-04 MED ORDER — BUPROPION HCL ER (XL) 300 MG PO TB24
300.0000 mg | ORAL_TABLET | Freq: Every day | ORAL | Status: DC
  Administered 2019-12-05 – 2019-12-08 (×4): 300 mg via ORAL
  Filled 2019-12-04 (×2): qty 2
  Filled 2019-12-04: qty 1
  Filled 2019-12-04: qty 2

## 2019-12-04 MED ORDER — LEVOTHYROXINE SODIUM 100 MCG PO TABS
200.0000 ug | ORAL_TABLET | Freq: Every day | ORAL | Status: DC
  Administered 2019-12-05 – 2019-12-08 (×4): 200 ug via ORAL
  Filled 2019-12-04 (×4): qty 2

## 2019-12-04 MED ORDER — VANCOMYCIN HCL IN DEXTROSE 1-5 GM/200ML-% IV SOLN
1000.0000 mg | Freq: Once | INTRAVENOUS | Status: AC
  Administered 2019-12-04: 19:00:00 1000 mg via INTRAVENOUS
  Filled 2019-12-04: qty 200

## 2019-12-04 MED ORDER — INSULIN ASPART 100 UNIT/ML ~~LOC~~ SOLN: 0.0000 [IU] | Freq: Every day | SUBCUTANEOUS | Status: DC

## 2019-12-04 MED ORDER — DIAZEPAM 5 MG PO TABS: 5.0000 mg | ORAL_TABLET | Freq: Three times a day (TID) | ORAL | Status: DC | PRN

## 2019-12-04 MED ORDER — LACTATED RINGERS IV BOLUS (SEPSIS)
400.0000 mL | Freq: Once | INTRAVENOUS | Status: AC
  Administered 2019-12-04: 17:00:00 400 mL via INTRAVENOUS

## 2019-12-04 MED ORDER — METRONIDAZOLE IN NACL 5-0.79 MG/ML-% IV SOLN
500.0000 mg | Freq: Once | INTRAVENOUS | Status: AC
  Administered 2019-12-04: 500 mg via INTRAVENOUS
  Filled 2019-12-04: qty 100

## 2019-12-04 MED ORDER — LACTATED RINGERS IV BOLUS (SEPSIS)
1000.0000 mL | Freq: Once | INTRAVENOUS | Status: AC
  Administered 2019-12-04: 17:00:00 1000 mL via INTRAVENOUS

## 2019-12-04 MED ORDER — VANCOMYCIN HCL 1500 MG/300ML IV SOLN
1500.0000 mg | Freq: Once | INTRAVENOUS | Status: AC
  Administered 2019-12-04: 1500 mg via INTRAVENOUS
  Filled 2019-12-04: qty 300

## 2019-12-04 MED ORDER — SODIUM ZIRCONIUM CYCLOSILICATE 5 G PO PACK
10.0000 g | PACK | Freq: Once | ORAL | Status: AC
  Administered 2019-12-04: 18:00:00 10 g via ORAL
  Filled 2019-12-04: qty 2

## 2019-12-04 MED ORDER — OXYCODONE HCL 5 MG PO TABS
5.0000 mg | ORAL_TABLET | ORAL | Status: DC | PRN
  Administered 2019-12-05 – 2019-12-07 (×8): 5 mg via ORAL
  Filled 2019-12-04 (×8): qty 1

## 2019-12-04 MED ORDER — ALBUTEROL SULFATE (2.5 MG/3ML) 0.083% IN NEBU: 10.0000 mg | INHALATION_SOLUTION | Freq: Once | RESPIRATORY_TRACT | Status: DC

## 2019-12-04 MED ORDER — HYDRALAZINE HCL 20 MG/ML IJ SOLN: 5.0000 mg | INTRAMUSCULAR | Status: DC | PRN

## 2019-12-04 MED ORDER — HEPARIN SODIUM (PORCINE) 5000 UNIT/ML IJ SOLN
5000.0000 [IU] | Freq: Three times a day (TID) | INTRAMUSCULAR | Status: DC
  Administered 2019-12-05 – 2019-12-08 (×11): 5000 [IU] via SUBCUTANEOUS
  Filled 2019-12-04 (×11): qty 1

## 2019-12-04 MED ORDER — DOCUSATE SODIUM 100 MG PO CAPS
100.0000 mg | ORAL_CAPSULE | Freq: Two times a day (BID) | ORAL | Status: DC
  Administered 2019-12-05 – 2019-12-08 (×7): 100 mg via ORAL
  Filled 2019-12-04 (×7): qty 1

## 2019-12-04 MED ORDER — ATORVASTATIN CALCIUM 10 MG PO TABS
10.0000 mg | ORAL_TABLET | Freq: Every day | ORAL | Status: DC
  Administered 2019-12-05 – 2019-12-08 (×4): 10 mg via ORAL
  Filled 2019-12-04 (×4): qty 1

## 2019-12-04 MED ORDER — MAGNESIUM CITRATE PO SOLN: 1.0000 | Freq: Once | ORAL | Status: DC | PRN

## 2019-12-04 MED ORDER — DEXTROSE 50 % IV SOLN
1.0000 | Freq: Once | INTRAVENOUS | Status: AC
  Administered 2019-12-04: 20:00:00 50 mL via INTRAVENOUS
  Filled 2019-12-04: qty 50

## 2019-12-04 MED ORDER — ALBUTEROL SULFATE HFA 108 (90 BASE) MCG/ACT IN AERS
4.0000 | INHALATION_SPRAY | Freq: Once | RESPIRATORY_TRACT | Status: AC
  Administered 2019-12-04: 20:00:00 4 via RESPIRATORY_TRACT
  Filled 2019-12-04: qty 6.7

## 2019-12-04 MED ORDER — INSULIN ASPART 100 UNIT/ML ~~LOC~~ SOLN
0.0000 [IU] | Freq: Three times a day (TID) | SUBCUTANEOUS | Status: DC
  Administered 2019-12-05: 3 [IU] via SUBCUTANEOUS
  Administered 2019-12-05: 4 [IU] via SUBCUTANEOUS
  Administered 2019-12-06 – 2019-12-07 (×2): 3 [IU] via SUBCUTANEOUS

## 2019-12-04 MED ORDER — VANCOMYCIN HCL 1750 MG/350ML IV SOLN: 1750.0000 mg | Freq: Three times a day (TID) | INTRAVENOUS | Status: DC

## 2019-12-04 MED ORDER — METRONIDAZOLE IN NACL 5-0.79 MG/ML-% IV SOLN
500.0000 mg | Freq: Three times a day (TID) | INTRAVENOUS | Status: DC
  Administered 2019-12-04 – 2019-12-06 (×5): 500 mg via INTRAVENOUS
  Filled 2019-12-04 (×5): qty 100

## 2019-12-04 MED ORDER — BISACODYL 10 MG RE SUPP: 10.0000 mg | Freq: Every day | RECTAL | Status: DC | PRN

## 2019-12-04 MED ORDER — INSULIN ASPART 100 UNIT/ML ~~LOC~~ SOLN
6.0000 [IU] | Freq: Three times a day (TID) | SUBCUTANEOUS | Status: DC
  Administered 2019-12-05 – 2019-12-08 (×10): 6 [IU] via SUBCUTANEOUS

## 2019-12-04 MED ORDER — SODIUM CHLORIDE 0.9 % IV SOLN: 2.0000 g | Freq: Three times a day (TID) | INTRAVENOUS | Status: DC

## 2019-12-04 MED ORDER — SODIUM CHLORIDE 0.9 % IV SOLN
2.0000 g | Freq: Once | INTRAVENOUS | Status: AC
  Administered 2019-12-04: 18:00:00 2 g via INTRAVENOUS
  Filled 2019-12-04: qty 2

## 2019-12-04 MED ORDER — SODIUM CHLORIDE 0.9 % IV SOLN
2.0000 g | INTRAVENOUS | Status: DC
  Administered 2019-12-04 – 2019-12-05 (×2): 2 g via INTRAVENOUS
  Filled 2019-12-04 (×2): qty 20

## 2019-12-04 MED ORDER — INSULIN ASPART 100 UNIT/ML IV SOLN
10.0000 [IU] | Freq: Once | INTRAVENOUS | Status: AC
  Administered 2019-12-04: 10 [IU] via INTRAVENOUS

## 2019-12-04 MED ORDER — FENOFIBRATE 160 MG PO TABS
160.0000 mg | ORAL_TABLET | Freq: Every day | ORAL | Status: DC
  Administered 2019-12-05 – 2019-12-08 (×4): 160 mg via ORAL
  Filled 2019-12-04 (×5): qty 1

## 2019-12-04 MED ORDER — ACETAMINOPHEN 650 MG RE SUPP: 650.0000 mg | Freq: Four times a day (QID) | RECTAL | Status: DC | PRN

## 2019-12-04 MED ORDER — ACETAMINOPHEN 325 MG PO TABS: 650.0000 mg | ORAL_TABLET | Freq: Four times a day (QID) | ORAL | Status: DC | PRN

## 2019-12-04 MED ORDER — SODIUM BICARBONATE 8.4 % IV SOLN
50.0000 meq | Freq: Once | INTRAVENOUS | Status: AC
  Administered 2019-12-04: 50 meq via INTRAVENOUS
  Filled 2019-12-04: qty 50

## 2019-12-04 MED ORDER — PANTOPRAZOLE SODIUM 40 MG PO TBEC
40.0000 mg | DELAYED_RELEASE_TABLET | Freq: Every day | ORAL | Status: DC
  Administered 2019-12-05 – 2019-12-08 (×4): 40 mg via ORAL
  Filled 2019-12-04 (×4): qty 1

## 2019-12-04 MED ORDER — INSULIN GLARGINE 100 UNIT/ML ~~LOC~~ SOLN
70.0000 [IU] | Freq: Every day | SUBCUTANEOUS | Status: DC
  Administered 2019-12-05 – 2019-12-07 (×4): 70 [IU] via SUBCUTANEOUS
  Filled 2019-12-04 (×5): qty 0.7

## 2019-12-04 MED ORDER — SODIUM BICARBONATE 8.4 % IV SOLN
INTRAVENOUS | Status: AC
  Filled 2019-12-04: qty 50

## 2019-12-04 NOTE — ED Notes (Signed)
Date and time results received: 12/11/2019 1603 (use smartphrase ".now" to insert current time)  Test: potassium Critical Value: 6.3  Name of Provider Notified: Janetta Hora PA  Orders Received? Or Actions Taken?: no/na

## 2019-12-04 NOTE — ED Triage Notes (Signed)
EMS reports pt lives at home with family.  Reports fell last week walking to his car.  Reports has been depressed for the past month because his mother passed away.  Reports hasn't been wanting to get up and do much for the past months.   Pt wears cpap and had temp 99 pta.  BP upon their arrival was 117/66.  After moving pt, bp 89/63.  EMS gave 59ml fluid bolus.  Pt alert and oriented.  Pt c/o pain all over.  Pt also c/o involuntary twitching in hands.

## 2019-12-04 NOTE — Progress Notes (Signed)
Pharmacy Antibiotic Note  [name] is a 59 y.o. male admitted on 11/26/2019 with sepsis.  Pharmacy has been consulted for vancomycin and cefepime  dosing.  Plan: Start cefepime 2g IV q8h Give   vancomycin 1g + 1.5g for a total loading dose of  2.5g    Maintenance dose:   vancomycin 1.75g  IV q8h Goal AUC: 534mcg*h/mL SCr used: 3.3mg /dL Pharmacy will continue to monitor renal function, vancomycin levels as clinically appropriate,  cultures and patient progress.    Height: 5\' 11"  (180.3 cm) Weight: (!) 272.2 kg (600 lb) IBW/kg (Calculated) : 75.3  Temp (24hrs), Avg:99.8 F (37.7 C), Min:99.8 F (37.7 C), Max:99.8 F (37.7 C)  Recent Labs  Lab 11/25/2019 1500 11/21/2019 1623  WBC 23.2*  --   CREATININE 3.30*  --   LATICACIDVEN 1.3 1.1    Estimated Creatinine Clearance: 54.5 mL/min (A) (by C-G formula based on SCr of 3.3 mg/dL (H)).    No Known Allergies  Antimicrobials this admission: metronidazole 4/15>>   cefepime 4/15 >>  vancomycin 4/15>>   Microbiology results: 4/15 BCx:   4/15  UCx:   4/15 Resp PCR: SARS CoV-2:    4/15 MRSA PCR:   Thank you for allowing pharmacy to be a part of this patient's care.  Despina Pole 12/05/2019 5:17 PM

## 2019-12-04 NOTE — H&P (Signed)
TRH H&P   Patient Demographics:    Masaji Billups, is a 59 y.o. male  MRN: [id number]   DOB - [birth year]  Admit Date - 12/12/2019  Outpatient Primary MD for the patient is Dione Housekeeper, MD  Referring MD/NP/PA: Prince Solian  Patient coming from: Home  Chief Complaint  Patient presents with  . Shortness of Breath      HPI:    Lon Klippel  is a 59 y.o. 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, following with wound clinic, patient presents to ED secondary to his weakness and shortness of breath, patient reports he had a fall a week ago when he was trying to get in his car, patient reports usually ambulates with a walker, for short distances, but he has not been able to ambulate since that fall, he has been bedbound since, he lives with his nephew and older brother, patient was able to walk and to drive up until a about a month ago,, patient has chronic lower extremity wound, where he has been following with wound clinic, he was on 10 days of Keflex which she finished couple days ago, patient chronic abdominal wounds has resolved, and he reports his chronic lower extremity wound has been improving, he denies any chest pain, fever, chills, nausea or vomiting, no coffee-ground emesis or melena. - in ED patient with was noted with increased oxygen requirement from 4 to 6 L, chest x-ray with some volume overload, initially hypotensive but responded to fluid bolus, hemoglobin 7.5, down from baseline 8-9, and WBC of 23K, normal UA, no infection on CXR, potassium of 6.3, received IV insulin and D50 , and lokelma, [facility] was consulted to admit.    Review of systems:    In  addition to the HPI above,  No Fever-chills, realized weakness and fatigue No Headache, No changes with Vision or hearing, No problems swallowing food or Liquids, No Chest pain, Cough or Shortness of Breath, No Abdominal pain, No Nausea or Vommitting, Bowel movements are regular, No Blood in stool or Urine, No dysuria, No new skin rashes or bruises, ports chronic lower extremity wounds No new joints pains-aches, reports chronic lower extremity pain No new weakness, tingling, numbness in any extremity, overall has difficulty ambulating No recent weight gain or loss, No polyuria, polydypsia or polyphagia, No significant Mental Stressors.  A full 10 point Review of Systems was done, except as stated above, all other Review of Systems were negative.   With Past History of the following :    Past Medical History:  Diagnosis Date  . Cellulitis   . Diabetes mellitus without complication ([place])   . Hypertension   . On home oxygen therapy  Past Surgical History:  Procedure Laterality Date  . CORONARY ANGIOPLASTY        Social History:     Social History   Tobacco Use  . Smoking status: Never Smoker  . Smokeless tobacco: Never Used  Substance Use Topics  . Alcohol use: No    Alcohol/week: 0.0 standard drinks     Lives -at home with older brother and nephew  Mobility -Ambulate for short distances with walker    Family History :   No family history of coronary artery disease at young age.   Home Medications:   Prior to Admission medications   Medication Sig Start Date End Date Taking? Authorizing Provider  albuterol (VENTOLIN HFA) 108 (90 Base) MCG/ACT inhaler Inhale 2 puffs by mouth every 6 hours as needed for wheezing 08/24/15  Yes [provider]  amLODipine (NORVASC) 5 MG tablet Take 1 Tablet by mouth once daily 06/04/17  Yes [provider]  atorvastatin (LIPITOR) 10 MG tablet Take 1 Tablet by mouth once daily 04/16/17  Yes [provider]  buPROPion (WELLBUTRIN XL) 300 MG 24 hr tablet Take 300 mg by mouth daily.  10/01/17  Yes [provider]  cyanocobalamin (,VITAMIN B-12,) 1000 MCG/ML injection inject ONE ML into THE muscle every 30 DAYS 03/01/17  Yes [provider]  diazepam (VALIUM) 10 MG tablet Take 10 mg by mouth every 8 (eight) hours as needed. for anxiety 10/02/17  Yes [provider]  diclofenac Sodium (VOLTAREN) 1 % GEL APPLY 4 G TOPICALLY 4 (FOUR) TIMES DAILY. 08/21/19  Yes Sanjuana Kava, MD  fenofibrate 160 MG tablet Take 1 Tablet by mouth once daily 12/04/16  Yes [provider]  ferrous sulfate 325 (65 FE) MG tablet Take by mouth.   Yes [provider]  gabapentin (NEURONTIN) 300 MG capsule Take 300 mg by mouth 3 (three) times daily. 11/25/19  Yes [provider]  hydrochlorothiazide (HYDRODIURIL) 25 MG tablet Take 25 mg by mouth daily. 09/30/19  Yes [provider]  ibuprofen (ADVIL) 800 MG tablet Take 800 mg by mouth every 8 (eight) hours as needed. 11/25/19  Yes [provider]  insulin aspart (NOVOLOG) 100 UNIT/ML FlexPen 60-70 Units 3 (three) times daily with meals. Sliding scale 06/26/17  Yes [provider]  Insulin Glargine (LANTUS SOLOSTAR) 100 UNIT/ML Solostar Pen inject 82 units subcutaneously AT bedtime 06/25/17  Yes [provider]  levothyroxine (SYNTHROID) 200 MCG tablet Take 200 mcg by mouth daily. 11/09/19  Yes [provider]  losartan (COZAAR) 100 MG tablet Take 100 mg by mouth daily. 09/30/19  Yes [provider]  metoprolol tartrate (LOPRESSOR) 50 MG tablet Take 50 mg by mouth 2 (two) times daily. 10/24/19  Yes [provider]  Multiple Vitamin (MULTIVITAMIN WITH MINERALS) TABS tablet Take 1 tablet by mouth daily.   Yes [provider]  oxyCODONE (ROXICODONE) 15 MG immediate release tablet One tablet every four to six hours for pain.  Must last 30 days. 12/02/19  Yes Sanjuana Kava, MD  OXYGEN Inhale into the lungs.   Yes [provider]  pantoprazole (PROTONIX) 40 MG tablet Take 40 mg by mouth daily. 10/06/19  Yes [provider]  torsemide (DEMADEX) 20 MG tablet Take 20 mg by mouth daily. 09/30/19  Yes [provider]  Vitamin D, Ergocalciferol, (DRISDOL) 50000 units CAPS capsule Take 1 Capsule by mouth 2 times a week 05/17/17  Yes [provider]  cephALEXin (KEFLEX) 500 MG capsule TAKE  ONE CAPSULE (500 MG DOSE) BY MOUTH 4 (FOUR) TIMES DAILY FOR 10 DAYS. Patient not taking: Reported on 11/26/2019 08/21/19   Sanjuana Kava, MD     Allergies:    No Known Allergies   Physical Exam:   Vitals  Blood pressure 94/60, pulse 87, temperature 99.8 F (37.7 C), temperature source Oral, resp. rate (!) 21, height 5\' 11"  (1.803 m), weight (!) 272.2 kg, SpO2 95 %.   1. General morbidly obese male laying in bed in no apparent distress, 2. Normal affect and insight, Not Suicidal or Homicidal, Awake Alert, Oriented X 3.  3. No F.N deficits, ALL C.Nerves Intact, Strength 5/5 all 4 extremities, Sensation intact all 4 extremities, Plantars down going.  4. Ears and Eyes appear Normal, Conjunctivae clear, PERRLA. Moist Oral Mucosa.  5. Supple Neck, No JVD, No cervical lymphadenopathy appriciated, No Carotid Bruits.  6.  Distant respiratory sounds, but clear to auscultation .  7. RRR, No Gallops, Rubs , No Parasternal Heave.  8.  Massively obese, nontender, no guarding or rigidity .  9.  No Cyanosis,  10. Good muscle tone,  joints appear normal , no effusions, Normal ROM.  11. No Palpable Lymph Nodes in Neck or Axillae  Patient with chronic lower extremity wounds, please see pictures below         Data Review:    CBC Recent Labs  Lab 11/25/2019 1500  WBC 23.2*  HGB 7.5*  HCT 26.4*  PLT 357  MCV 94.6  MCH 26.9  MCHC 28.4*  RDW 15.9*  LYMPHSABS 0.9  MONOABS 3.0*  EOSABS 0.2  BASOSABS 0.1    ------------------------------------------------------------------------------------------------------------------  Chemistries  Recent Labs  Lab 12/07/2019 1500  NA 134*  K 6.3*  CL 103  CO2 21*  GLUCOSE 203*  BUN 59*  CREATININE 3.30*  CALCIUM 8.4*  AST 19  ALT 12  ALKPHOS 61  BILITOT 0.9   ------------------------------------------------------------------------------------------------------------------ estimated creatinine clearance is 54.5 mL/min (A) (by C-G formula based on SCr of 3.3 mg/dL (H)). ------------------------------------------------------------------------------------------------------------------ No results for input(s): TSH, T4TOTAL, T3FREE, THYROIDAB in the last 72 hours.  Invalid input(s): FREET3  Coagulation profile Recent Labs  Lab 12/01/2019 1618  INR 1.3*   ------------------------------------------------------------------------------------------------------------------- No results for input(s): DDIMER in the last 72 hours. -------------------------------------------------------------------------------------------------------------------  Cardiac Enzymes No results for input(s): CKMB, TROPONINI, MYOGLOBIN in the last 168 hours.  Invalid input(s): CK ------------------------------------------------------------------------------------------------------------------    Component Value Date/Time   BNP 735.0 (H) 11/29/2019 1500     ---------------------------------------------------------------------------------------------------------------  Urinalysis    Component Value Date/Time   COLORURINE YELLOW 11/27/2019 1502   APPEARANCEUR CLOUDY (A) 12/19/2019 1502   LABSPEC 1.014 11/23/2019 1502   PHURINE 5.0 11/24/2019 1502   GLUCOSEU 50 (A) 12/10/2019 1502   HGBUR NEGATIVE 12/02/2019 1502   BILIRUBINUR NEGATIVE 12/02/2019 1502   KETONESUR NEGATIVE 11/24/2019 1502   PROTEINUR 30 (A) 12/05/2019 1502   NITRITE NEGATIVE 12/18/2019 1502    LEUKOCYTESUR NEGATIVE 11/22/2019 1502    ----------------------------------------------------------------------------------------------------------------   Imaging Results:    DG Tibia/Fibula Right  Result Date: 11/24/2019 CLINICAL DATA:  Fall last week with persistent right lower leg pain. EXAM: RIGHT TIBIA AND FIBULA - 2 VIEW COMPARISON:  Right knee 04/13/2010 FINDINGS: Moderate osteoarthritic change of the medial and lateral compartments of the knee. No acute fracture or dislocation. Linear metallic artifact over the AP view along the lateral aspect of the distal fibula. No acute fracture or dislocation. Subtle calcifications over the soft tissues of the lower leg possibly venous. IMPRESSION:  No acute findings. Moderate osteoarthritis of the right knee. Electronically Signed   By: Marin Olp M.D.   On: 11/30/2019 15:43   DG Chest Port 1 View  Result Date: 12/07/2019 CLINICAL DATA:  Fall last week.  Shortness of breath. EXAM: PORTABLE CHEST 1 VIEW COMPARISON:  None. FINDINGS: Lungs are adequately inflated with mild hazy opacification over the medial right mid to lower lung suggesting atelectasis and possible small amount layering pleural fluid. Minimal hazy prominence of the perihilar vessels. Moderate cardiomegaly. Remainder of the exam is unremarkable. IMPRESSION: Moderate cardiomegaly with suggestion mild vascular congestion. Hazy density over the medial right mid to lower lung likely atelectasis with small amount of layering pleural fluid. Electronically Signed   By: Marin Olp M.D.   On: 12/17/2019 15:36   DG Shoulder Right Portable  Result Date: 11/22/2019 CLINICAL DATA:  Fall last week with persistent right shoulder pain. EXAM: PORTABLE RIGHT SHOULDER COMPARISON:  None. FINDINGS: Mild degenerative change of the [facility] joint. No acute fracture or dislocation. IMPRESSION: No acute findings. Electronically Signed   By: Marin Olp M.D.   On: 12/03/2019 15:37    My personal review of  EKG: Rhythm NSR,  no Acute ST changes   Assessment & Plan:    Active Problems:   CKD (chronic kidney disease) stage 3, GFR 30-59 ml/min   DM type 2 with diabetic dyslipidemia (HCC)   Hypertension associated with diabetes ([place])   Hypothyroidism   Iron deficiency anemia   Morbid obesity with BMI of 70 and over, adult ([place])   Obstructive sleep apnea syndrome   Infected wound   Infected right lower extremity wound -This is a chronic wound, has been following with outpatient wound care, overall patient and nephew reported has been improving, but wound appears with some purulent discharge, and foul-smelling odor, so wound care order set utilized, continue with IV Rocephin and Flagyl for now, wound care consulted for wound care recommendation. -General surgery consulted to evaluate wound and give recommendation. -Follow on blood cultures. -Leukocytosis related to infectious process, does not appear to be septic in admission with normal lactic acid level.  AKI on CKD stage IV -Baseline creatinine 2.5, has worsened to 3.3 this admission, as well with hyperkalemia of 6.3, ED discussed with renal, recommendation for renal ultrasound, hold losartan and they will evaluate in a.m.Marland Kitchen  Hyperkalemia -Received Lokelma, IV insulin and D50, and calcium carbonate.  Recheck level in 4 hours.  Acute on chronic hypoxic respiratory failure. -He is currently on 6 L nasal cannula, at baseline on 4 L nasal cannula, will need to be resumed on his torsemide when blood pressure and kidney function improves.  Diabetes mellitus -We will resume home dose Lantus at a lower dose, will continue with resistant sliding scale, and 6 units before meals. -Check A1c.  Anemia of chronic kidney disease -Plan hemoglobin 8-9, 87.5 today, no evidence of significant GI bleed, will check anemia panel, likely will benefit from Procrit, and possibly IV iron once infection resolves.  Hypertension -Blood pressure is low this  admission, hold home medications for now, actually he received some fluids, will hold on further IV fluids as blood pressure has improved especially with some signs of volume overload and elevated BNP.  Obstructive sleep apnea -Continue with CPAP  Hypothyroidism -Continue with Synthroid  Super morbid obesity -With BMI of 83  Hyperlipidemia -Continue with home dose statin.  Ambulatory dysfunction -This is secondary to his morbid obesity, will consult PT and OT, and he ambulates  with a walker for short distance, but last time he ambulated was 10 days ago.          DVT Prophylaxis Heparin  AM Labs Ordered, also please review Full Orders  Family Communication: Admission, patients condition and plan of care including tests being ordered have been discussed with the patient and nephew who indicate understanding and agree with the plan and Code Status.  Code Status Full  Likely DC to  Home  Condition GUARDED    Consults called: renal by ED    Admission status: inpatient  Time spent in minutes : 60 minutes   Phillips Climes M.D on 12/10/2019 at 7:10 PM  Between 7am to 7pm - Pager - [phone]. After 7pm go to [email or web address] - password [facility]  [facility] - Office  [phone]

## 2019-12-04 NOTE — TOC Initial Note (Signed)
Transition of Care [facility]) - Initial/Assessment Note    Patient Details  Name: [name] MRN: [id number] Date of Birth: 1960-12-23  Transition of Care [facility]) CM/SW Contact:    Sherie Don, LCSW Phone Number: 12/18/2019, 7:35 PM  Clinical Narrative: Patient is a 59 year old male admitted to the hospital for sepsis. TOC received consult for possible HH/DME/medication assistance needs. CSW met with patient and his nephew, Aris Lot, to complete initial assessment. Per patient and nephew, patient lives in a single family home with his nephew and brother. Patient has home O2, for which he is on 4.5L nasal cannula. He also has a CPAP and a rollator walker his sister gave him. Patient also sleeps in a hospital bed and is trying to get a replacement bed through [facility].   Patient does not have a history with HH. He also reported he does not need medication assistance as he has already met his $3200 deductible for his medications this year, so all medications for the rest of the year will be covered by his insurance.  Patient reported he sees Dr. Quentin Cornwall at [facility] for wound care, but has been doing televisits for the past 2 months after he fell and could not get in the car. Patient also reported he sees Dr. Shaune Leeks for orthopedics and has not received injections in his knees for bone spurs for 3 months. Patient and nephew reported patient was able to ambulate with a cane prior to his fall, but now requires the rollator walker. TOC to follow.  Expected Discharge Plan: [place] Barriers to Discharge: Continued Medical Work up  Patient Goals and CMS Choice Patient states their goals for this hospitalization and ongoing recovery are:: Return home CMS Medicare.gov Compare Post Acute Care list provided to:: Patient Choice offered to / list presented to : Patient  Expected Discharge Plan and Services Expected Discharge Plan: [place]  Prior Living  Arrangements/Services Lives with:: Siblings, Relatives(Hunter Damita Dunnings (nephew); brother) Patient language and need for interpreter reviewed:: Yes Do you feel safe going back to the place where you live?: Yes      Need for Family Participation in Patient Care: No (Comment) Care giver support system in place?: Yes (comment)(Hunter Damita Dunnings (nephew); brother)   Criminal Activity/Legal Involvement Pertinent to Current Situation/Hospitalization: No - Comment as needed  Activities of Daily Living Home Assistive Devices/Equipment: CPAP, Oxygen ADL Screening (condition at time of admission) Patient's cognitive ability adequate to safely complete daily activities?: Yes Is the patient deaf or have difficulty hearing?: No Does the patient have difficulty seeing, even when wearing glasses/contacts?: No Does the patient have difficulty concentrating, remembering, or making decisions?: No Patient able to express need for assistance with ADLs?: Yes Does the patient have difficulty dressing or bathing?: Yes Independently performs ADLs?: No Communication: Independent Dressing (OT): Dependent Is this a change from baseline?: Pre-admission baseline Grooming: Dependent Is this a change from baseline?: Pre-admission baseline Feeding: Independent Bathing: Dependent Is this a change from baseline?: Pre-admission baseline Toileting: Needs assistance Is this a change from baseline?: Pre-admission baseline In/Out Bed: Dependent Is this a change from baseline?: Pre-admission baseline Walks in Home: Dependent Is this a change from baseline?: Pre-admission baseline Does the patient have difficulty walking or climbing stairs?: Yes Weakness of Legs: Both Weakness of Arms/Hands: Both  Permission Sought/Granted    Emotional Assessment Appearance:: Appears stated age Attitude/Demeanor/Rapport: Engaged Affect (typically observed): Accepting Orientation: : Oriented to Self, Oriented to Place, Oriented  to  Time,  Oriented to Situation Alcohol / Substance Use: Not Applicable Psych Involvement: No (comment)  Admission diagnosis:  Infected wound [T14.8XXA, L08.9] Patient Active Problem List   Diagnosis Date Noted  . Infected wound 12/12/2019  . Grade I hemorrhoids 10/15/2018  . Long-term current use of thyroid hormone replacement therapy 12/04/2017  . On statin therapy 12/04/2017  . Obstructive sleep apnea syndrome 06/19/2017  . Hypertension associated with diabetes ([place]) 02/22/2017  . Anxiety, generalized 04/10/2016  . Hypothyroidism 11/22/2015  . Iron deficiency anemia 11/22/2015  . Right knee pain 10/06/2015  . Infective panniculitis 08/24/2015  . Vitamin D deficiency 08/24/2015  . DM type 2 with diabetic dyslipidemia ([place]) 02/01/2015  . Gastroesophageal reflux disease without esophagitis 02/01/2015  . Pernicious anemia 02/01/2015  . Candidal intertrigo 08/26/2014  . CKD (chronic kidney disease) stage 3, GFR 30-59 ml/min 08/26/2014  . Long-term insulin use ([place]) 08/26/2014  . Binge eating 12/22/2013  . Morbid obesity with BMI of 70 and over, adult ([place]) 12/22/2013  . Need for immunization against influenza 09/09/2012  . Arthritis 04/11/2012   PCP:  Dione Housekeeper, MD Pharmacy:   [address] [place], [place] [address] Phone: [phone] Fax: [phone]  [id number]: [phone]: [phone] Readmission Risk Interventions No flowsheet data found.

## 2019-12-04 NOTE — ED Provider Notes (Signed)
[facility] EMERGENCY DEPARTMENT Provider Note   CSN: [id number] Arrival date & time: 12/07/2019  1359     History Chief Complaint  Patient presents with  . Shortness of Breath    [name] is a 59 y.o. male with history of super morbid obesity, chronic respiratory failure on 4LO2, OSA, Type 2 DM, CKD, chronic pressure wounds with recurrent cellulitis presents with generalized weakness and shortness of breath. Pt states that he fell a week ago trying to get in his car (per chart review this actually appears to have happened in February). He states his foot got "hung" on the door and he fell on to his buttocks. He wasn't able to get up on his own and called 911 and they helped him up. He declined transport to the ED at that time because he didn't have any injury from the fall. He states that his mother passed away a week ago as well and that has made him very depressed. He states he was even more depressed because he couldn't go to her funeral due to not being able to walk. He was able to walk and drive up until about a month ago. Since then he has been able to get out of bed and take a couple steps but can't do much more than that. He lives with his nephew and older brother. He goes to the wound center and his nephew does dressing changes on his right leg every 3 days or so. He was also on 10 days of Keflex which he finished a couple days ago for cellulitis. He has chronic wounds of the abdomen and skin folds of the legs due to his morbid obesity. He also states he has a lot of pain in his knees from "bone spurs" on his knee caps and hasn't been able to get shots in his knees for the past two months due to mobility issues. He has been running a fever at home but is unsure of the number. He reports associated sore throat, "throat congestion" and a dry mouth for one week. He denies chest pain, lightheadedness/syncope, cough, abdominal pain, N/V/D, urinary symptoms. He is eating and drinking but  reports decreased appetite due to feeling depressed. He wasn't able to get out of bed today at all and so he called EMS. He states he thinks this is because he is depressed.  HPI     Past Medical History:  Diagnosis Date  . Cellulitis   . Diabetes mellitus without complication ([place])   . Hypertension   . On home oxygen therapy     Patient Active Problem List   Diagnosis Date Noted  . Grade I hemorrhoids 10/15/2018  . Long-term current use of thyroid hormone replacement therapy 12/04/2017  . On statin therapy 12/04/2017  . Obstructive sleep apnea syndrome 06/19/2017  . Hypertension associated with diabetes ([place]) 02/22/2017  . Anxiety, generalized 04/10/2016  . Hypothyroidism 11/22/2015  . Iron deficiency anemia 11/22/2015  . Right knee pain 10/06/2015  . Infective panniculitis 08/24/2015  . Vitamin D deficiency 08/24/2015  . DM type 2 with diabetic dyslipidemia ([place]) 02/01/2015  . Gastroesophageal reflux disease without esophagitis 02/01/2015  . Pernicious anemia 02/01/2015  . Candidal intertrigo 08/26/2014  . CKD (chronic kidney disease) stage 3, GFR 30-59 ml/min 08/26/2014  . Long-term insulin use ([place]) 08/26/2014  . Binge eating 12/22/2013  . Morbid obesity with BMI of 70 and over, adult ([place]) 12/22/2013  . Need for immunization against influenza 09/09/2012  . Arthritis  04/11/2012    Past Surgical History:  Procedure Laterality Date  . CORONARY ANGIOPLASTY         No family history on file.  Social History   Tobacco Use  . Smoking status: Never Smoker  . Smokeless tobacco: Never Used  Substance Use Topics  . Alcohol use: No    Alcohol/week: 0.0 standard drinks  . Drug use: No    Home Medications Prior to Admission medications   Medication Sig Start Date End Date Taking? Authorizing Provider  albuterol (VENTOLIN HFA) 108 (90 Base) MCG/ACT inhaler Inhale 2 puffs by mouth every 6 hours as needed for wheezing 08/24/15   [provider]  amLODipine  (NORVASC) 5 MG tablet Take 1 Tablet by mouth once daily 06/04/17   [provider]  atorvastatin (LIPITOR) 10 MG tablet Take 1 Tablet by mouth once daily 04/16/17   [provider]  buPROPion (WELLBUTRIN XL) 300 MG 24 hr tablet  10/01/17   [provider]  cephALEXin (KEFLEX) 500 MG capsule TAKE ONE CAPSULE (500 MG DOSE) BY MOUTH 4 (FOUR) TIMES DAILY FOR 10 DAYS. 08/21/19   Sanjuana Kava, MD  cyanocobalamin (,VITAMIN B-12,) 1000 MCG/ML injection inject ONE ML into THE muscle every 30 DAYS 03/01/17   [provider]  diazepam (VALIUM) 10 MG tablet Take 10 mg by mouth every 8 (eight) hours as needed. for anxiety 10/02/17   [provider]  diclofenac Sodium (VOLTAREN) 1 % GEL APPLY 4 G TOPICALLY 4 (FOUR) TIMES DAILY. 08/21/19   Sanjuana Kava, MD  fenofibrate 160 MG tablet Take 1 Tablet by mouth once daily 12/04/16   [provider]  ferrous sulfate 325 (65 FE) MG tablet Take by mouth.    [provider]  gabapentin (NEURONTIN) 100 MG capsule Take 2 Capsules by mouth at bedtime 04/16/17   [provider]  insulin aspart (NOVOLOG) 100 UNIT/ML FlexPen inject 60 units subcutaneously BEFORE breakfast, LUNCH, AND SUPPER 06/26/17   [provider]  Insulin Glargine (LANTUS SOLOSTAR) 100 UNIT/ML Solostar Pen inject 82 units subcutaneously AT bedtime 06/25/17   [provider]  oxyCODONE (ROXICODONE) 15 MG immediate release tablet One tablet every four to six hours for pain.  Must last 30 days. 12/02/19   Sanjuana Kava, MD  OXYGEN Inhale into the lungs.    [provider]  Vitamin D, Ergocalciferol, (DRISDOL) 50000 units CAPS capsule Take 1 Capsule by mouth 2 times a week 05/17/17   [provider]    Allergies    Patient has no known allergies.  Review of Systems   Review of Systems  Constitutional: Positive for activity change, appetite change and fever.  HENT: Positive for congestion and sore throat.     Respiratory: Positive for shortness of breath. Negative for cough and wheezing.   Cardiovascular: Positive for leg swelling. Negative for chest pain.  Gastrointestinal: Positive for constipation, diarrhea, nausea and vomiting. Negative for abdominal pain.  Genitourinary: Negative for difficulty urinating, dysuria, flank pain and frequency.  Musculoskeletal: Positive for arthralgias and myalgias.  Skin: Positive for wound.  Neurological: Positive for headaches.  Psychiatric/Behavioral: Positive for dysphoric mood. Negative for self-injury and suicidal ideas.  All other systems reviewed and are negative.   Physical Exam Updated Vital Signs BP (!) 110/58 (BP Location: Right Arm)   Pulse 88   Temp 99.8 F (37.7 C) (Oral)   Resp (!) 26   Ht 5\' 11"  (1.803 m)   Wt (!) 272.2 kg   SpO2 99%  BMI 83.68 kg/m   Physical Exam Vitals and nursing note reviewed.  Constitutional:      General: He is not in acute distress.    Appearance: He is well-developed. He is obese. He is not ill-appearing.     Comments: Morbid obesity. Calm and cooperative. On 4L via [place]. Mildly tachypneic. Exam is limited due to patient's body habitus  HENT:     Head: Normocephalic and atraumatic.     Mouth/Throat:     Mouth: Mucous membranes are dry.     Comments: Dry Eyes:     General: No scleral icterus.       Right eye: No discharge.        Left eye: No discharge.     Conjunctiva/sclera: Conjunctivae normal.     Pupils: Pupils are equal, round, and reactive to light.  Cardiovascular:     Rate and Rhythm: Normal rate and regular rhythm.     Heart sounds: Murmur present.  Pulmonary:     Effort: Pulmonary effort is normal. Tachypnea present. No respiratory distress.     Breath sounds: Wheezes: slight expiratory wheeze.  Abdominal:     General: There is no distension.     Palpations: Abdomen is soft.     Tenderness: There is no abdominal tenderness.     Comments: Anasarca. Chronic skin changes of the pannus  due to chronic swelling  Musculoskeletal:     Cervical back: Normal range of motion.     Comments: RLE: Large deep malodorous wound over the posterior right leg which is draining serosanguinous fluid with overlying chronic venous stasis changes  Skin:    General: Skin is warm and dry.  Neurological:     Mental Status: He is alert and oriented to person, place, and time.  Psychiatric:        Behavior: Behavior normal.       ED Results / Procedures / Treatments   Labs (all labs ordered are listed, but only abnormal results are displayed) Labs Reviewed  BRAIN NATRIURETIC PEPTIDE - Abnormal; Notable for the following components:      Result Value   B Natriuretic Peptide 735.0 (*)    All other components within normal limits  CBC WITH DIFFERENTIAL/PLATELET - Abnormal; Notable for the following components:   WBC 23.2 (*)    RBC 2.79 (*)    Hemoglobin 7.5 (*)    HCT 26.4 (*)    MCHC 28.4 (*)    RDW 15.9 (*)    Neutro Abs 18.7 (*)    Monocytes Absolute 3.0 (*)    Abs Immature Granulocytes 0.25 (*)    All other components within normal limits  COMPREHENSIVE METABOLIC PANEL - Abnormal; Notable for the following components:   Sodium 134 (*)    Potassium 6.3 (*)    CO2 21 (*)    Glucose, Bld 203 (*)    BUN 59 (*)    Creatinine, Ser 3.30 (*)    Calcium 8.4 (*)    Albumin 2.5 (*)    GFR calc non Af Amer 20 (*)    GFR calc Af Amer 23 (*)    All other components within normal limits  URINALYSIS, ROUTINE W REFLEX MICROSCOPIC - Abnormal; Notable for the following components:   APPearance CLOUDY (*)    Glucose, UA 50 (*)    Protein, ur 30 (*)    Bacteria, UA RARE (*)    All other components within normal limits  RAPID URINE DRUG SCREEN, HOSP PERFORMED -  Abnormal; Notable for the following components:   Benzodiazepines POSITIVE (*)    All other components within normal limits  PROTIME-INR - Abnormal; Notable for the following components:   Prothrombin Time 15.8 (*)    INR 1.3 (*)     All other components within normal limits  SARS CORONAVIRUS 2 (TAT 6-24 HRS)  CULTURE, BLOOD (ROUTINE X 2)  CULTURE, BLOOD (ROUTINE X 2)  URINE CULTURE  MRSA PCR SCREENING  LACTIC ACID, PLASMA  LACTIC ACID, PLASMA  ETHANOL  APTT  OSMOLALITY, URINE  TROPONIN I (HIGH SENSITIVITY)  TROPONIN I (HIGH SENSITIVITY)    EKG EKG Interpretation  Date/Time:  Thursday December 04 2019 14:14:37 EDT Ventricular Rate:  90 PR Interval:    QRS Duration: 116 QT Interval:  357 QTC Calculation: 437 R Axis:   85 Text Interpretation: Sinus rhythm Nonspecific intraventricular conduction delay Borderline low voltage, extremity leads No STEMI Confirmed by Nanda Quinton [phone]) on 12/01/2019 3:38:19 PM   Radiology DG Tibia/Fibula Right  Result Date: 12/13/2019 CLINICAL DATA:  Fall last week with persistent right lower leg pain. EXAM: RIGHT TIBIA AND FIBULA - 2 VIEW COMPARISON:  Right knee 04/13/2010 FINDINGS: Moderate osteoarthritic change of the medial and lateral compartments of the knee. No acute fracture or dislocation. Linear metallic artifact over the AP view along the lateral aspect of the distal fibula. No acute fracture or dislocation. Subtle calcifications over the soft tissues of the lower leg possibly venous. IMPRESSION: No acute findings. Moderate osteoarthritis of the right knee. Electronically Signed   By: Marin Olp M.D.   On: 12/10/2019 15:43   DG Chest Port 1 View  Result Date: 12/03/2019 CLINICAL DATA:  Fall last week.  Shortness of breath. EXAM: PORTABLE CHEST 1 VIEW COMPARISON:  None. FINDINGS: Lungs are adequately inflated with mild hazy opacification over the medial right mid to lower lung suggesting atelectasis and possible small amount layering pleural fluid. Minimal hazy prominence of the perihilar vessels. Moderate cardiomegaly. Remainder of the exam is unremarkable. IMPRESSION: Moderate cardiomegaly with suggestion mild vascular congestion. Hazy density over the medial right mid  to lower lung likely atelectasis with small amount of layering pleural fluid. Electronically Signed   By: Marin Olp M.D.   On: 12/01/2019 15:36   DG Shoulder Right Portable  Result Date: 12/06/2019 CLINICAL DATA:  Fall last week with persistent right shoulder pain. EXAM: PORTABLE RIGHT SHOULDER COMPARISON:  None. FINDINGS: Mild degenerative change of the [facility] joint. No acute fracture or dislocation. IMPRESSION: No acute findings. Electronically Signed   By: Marin Olp M.D.   On: 11/25/2019 15:37    Procedures Procedures (including critical care time)  CRITICAL CARE Performed by: Recardo Evangelist   Total critical care time: 40 minutes  Critical care time was exclusive of separately billable procedures and treating other patients.  Critical care was necessary to treat or prevent imminent or life-threatening deterioration.  Critical care was time spent personally by me on the following activities: development of treatment plan with patient and/or surrogate as well as nursing, discussions with consultants, evaluation of patient's response to treatment, examination of patient, obtaining history from patient or surrogate, ordering and performing treatments and interventions, ordering and review of laboratory studies, ordering and review of radiographic studies, pulse oximetry and re-evaluation of patient's condition.   Medications Ordered in ED Medications  vancomycin (VANCOCIN) IVPB 1000 mg/200 mL premix (has no administration in time range)  vancomycin (VANCOREADY) IVPB 1500 mg/300 mL (has no administration in time range)  vancomycin (VANCOREADY) IVPB 1750 mg/350 mL (has no administration in time range)  ceFEPIme (MAXIPIME) 2 g in sodium chloride 0.9 % 100 mL IVPB (has no administration in time range)  albuterol (PROVENTIL) (2.5 MG/3ML) 0.083% nebulizer solution 10 mg (has no administration in time range)  insulin aspart (novoLOG) injection 10 Units (has no administration in time  range)    And  dextrose 50 % solution 50 mL (has no administration in time range)  sodium bicarbonate injection 50 mEq (has no administration in time range)  lactated ringers bolus 1,000 mL (1,000 mLs Intravenous New Bag/Given 12/07/2019 1703)    And  lactated ringers bolus 1,000 mL (1,000 mLs Intravenous New Bag/Given 12/06/2019 1635)    And  lactated ringers bolus 400 mL (400 mLs Intravenous New Bag/Given 12/15/2019 1704)  ceFEPIme (MAXIPIME) 2 g in sodium chloride 0.9 % 100 mL IVPB (2 g Intravenous New Bag/Given 12/03/2019 1745)  metroNIDAZOLE (FLAGYL) IVPB 500 mg (500 mg Intravenous New Bag/Given 12/11/2019 1636)  sodium zirconium cyclosilicate (LOKELMA) packet 10 g (10 g Oral Given 12/17/2019 1752)    ED Course  I have reviewed the triage vital signs and the nursing notes.  Pertinent labs & imaging results that were available during my care of the patient were reviewed by me and considered in my medical decision making (see chart for details).  59 year old male presents with generalized weakness and SOB. Pt is super morbidly obese with multiple medical problems. He feels his symptoms are stemming from depression. Pt was hypotensive with EMS around 89/63 and temp is elevated (99.8). He is tachypenic. Exam is limited due to his body habitus. His wound was unwrapped and it is malodorous and draining. EKG shows sinus rhythm. Will obtain labs, CXR, BNP, Lactate.  CBC is remarkable for leukocytosis (23) and worsening anemia (7.5 down from 9 in Nov 2020). CMP shows hyperkalemia (6.3) and worsening CKD (3.3) which is up from baseline ~2. Lactate is normal. BNP is 735. Code sepsis initiated and blood cultures, fluids, and empiric abx started. CXR shows cardiomegaly with pulmonary vascular congestion and small effusion. Shared visit with Dr. Karle Starch. Will discuss with Nephrology.  Discussed with Dr. Justin Mend - he recommends starting [facility] and obtaining a renal US. Nephrology will see in the AM.  Discussed with Dr.  Waldron Labs who will admit.  MDM Rules/Calculators/A&P                       Final Clinical Impression(s) / ED Diagnoses Final diagnoses:  Sepsis, due to unspecified organism, unspecified whether acute organ dysfunction present [facility])  AKI (acute kidney injury) ([place])  Hyperkalemia    Rx / DC Orders ED Discharge Orders    None       Recardo Evangelist, PA-C 11/23/2019 1851    Long, W[facility], MD 12/05/19 [phone]

## 2019-12-05 ENCOUNTER — Other Ambulatory Visit: Payer: Self-pay

## 2019-12-05 ENCOUNTER — Inpatient Hospital Stay (HOSPITAL_COMMUNITY): Payer: Medicare HMO

## 2019-12-05 ENCOUNTER — Encounter (HOSPITAL_COMMUNITY): Payer: Self-pay | Admitting: Internal Medicine

## 2019-12-05 DIAGNOSIS — S71102A Unspecified open wound, left thigh, initial encounter: Secondary | ICD-10-CM | POA: Diagnosis not present

## 2019-12-05 DIAGNOSIS — S81801A Unspecified open wound, right lower leg, initial encounter: Secondary | ICD-10-CM | POA: Diagnosis not present

## 2019-12-05 LAB — RENAL FUNCTION PANEL
Albumin: 2.3 g/dL — ABNORMAL LOW (ref 3.5–5.0)
Anion gap: 9 (ref 5–15)
BUN: 59 mg/dL — ABNORMAL HIGH (ref 6–20)
CO2: 24 mmol/L (ref 22–32)
Calcium: 8.6 mg/dL — ABNORMAL LOW (ref 8.9–10.3)
Chloride: 105 mmol/L (ref 98–111)
Creatinine, Ser: 3.54 mg/dL — ABNORMAL HIGH (ref 0.61–1.24)
GFR calc Af Amer: 21 mL/min — ABNORMAL LOW (ref >60)
GFR calc non Af Amer: 18 mL/min — ABNORMAL LOW (ref >60)
Glucose, Bld: 150 mg/dL — ABNORMAL HIGH (ref 70–99)
Phosphorus: 5.2 mg/dL — ABNORMAL HIGH (ref 2.5–4.6)
Potassium: 5.8 mmol/L — ABNORMAL HIGH (ref 3.5–5.1)
Sodium: 138 mmol/L (ref 135–145)

## 2019-12-05 LAB — CBC
HCT: 27.6 % — ABNORMAL LOW (ref 39.0–52.0)
Hemoglobin: 7.4 g/dL — ABNORMAL LOW (ref 13.0–17.0)
MCH: 27.2 pg (ref 26.0–34.0)
MCHC: 26.8 g/dL — ABNORMAL LOW (ref 30.0–36.0)
MCV: 101.5 fL — ABNORMAL HIGH (ref 80.0–100.0)
Platelets: 322 10*3/uL (ref 150–400)
RBC: 2.72 MIL/uL — ABNORMAL LOW (ref 4.22–5.81)
RDW: 16.3 % — ABNORMAL HIGH (ref 11.5–15.5)
WBC: 16.5 10*3/uL — ABNORMAL HIGH (ref 4.0–10.5)
nRBC: 0.2 % (ref 0.0–0.2)

## 2019-12-05 LAB — BASIC METABOLIC PANEL
Anion gap: 8 (ref 5–15)
BUN: 58 mg/dL — ABNORMAL HIGH (ref 6–20)
CO2: 22 mmol/L (ref 22–32)
Calcium: 8.4 mg/dL — ABNORMAL LOW (ref 8.9–10.3)
Chloride: 106 mmol/L (ref 98–111)
Creatinine, Ser: 3.39 mg/dL — ABNORMAL HIGH (ref 0.61–1.24)
GFR calc Af Amer: 22 mL/min — ABNORMAL LOW (ref >60)
GFR calc non Af Amer: 19 mL/min — ABNORMAL LOW (ref >60)
Glucose, Bld: 156 mg/dL — ABNORMAL HIGH (ref 70–99)
Potassium: 6.7 mmol/L (ref 3.5–5.1)
Sodium: 136 mmol/L (ref 135–145)

## 2019-12-05 LAB — URINE CULTURE

## 2019-12-05 LAB — GLUCOSE, CAPILLARY
Glucose-Capillary: 111 mg/dL — ABNORMAL HIGH (ref 70–99)
Glucose-Capillary: 164 mg/dL — ABNORMAL HIGH (ref 70–99)
Glucose-Capillary: 140 mg/dL — ABNORMAL HIGH (ref 70–99)
Glucose-Capillary: 152 mg/dL — ABNORMAL HIGH (ref 70–99)

## 2019-12-05 LAB — HEMOGLOBIN A1C
Hgb A1c MFr Bld: 6.4 % — ABNORMAL HIGH (ref 4.8–5.6)
Mean Plasma Glucose: 136.98 mg/dL

## 2019-12-05 LAB — MRSA PCR SCREENING: MRSA by PCR: POSITIVE — AB

## 2019-12-05 LAB — CK: Total CK: 55 U/L (ref 49–397)

## 2019-12-05 LAB — SARS CORONAVIRUS 2 (TAT 6-24 HRS): SARS Coronavirus 2: NEGATIVE

## 2019-12-05 MED ORDER — ENSURE MAX PROTEIN PO LIQD
11.0000 [oz_av] | Freq: Two times a day (BID) | ORAL | Status: DC
  Administered 2019-12-05 – 2019-12-08 (×6): 11 [oz_av] via ORAL

## 2019-12-05 MED ORDER — SODIUM POLYSTYRENE SULFONATE 15 GM/60ML PO SUSP
45.0000 g | Freq: Once | ORAL | Status: AC
  Administered 2019-12-05: 45 g via ORAL
  Filled 2019-12-05: qty 180

## 2019-12-05 MED ORDER — CHLORHEXIDINE GLUCONATE CLOTH 2 % EX PADS
6.0000 | MEDICATED_PAD | Freq: Every day | CUTANEOUS | Status: DC
  Administered 2019-12-05 – 2019-12-07 (×3): 6 via TOPICAL

## 2019-12-05 MED ORDER — FUROSEMIDE 10 MG/ML IJ SOLN
30.0000 mg | Freq: Once | INTRAMUSCULAR | Status: AC
  Administered 2019-12-05: 30 mg via INTRAVENOUS
  Filled 2019-12-05: qty 4

## 2019-12-05 MED ORDER — PRO-STAT SUGAR FREE PO LIQD
30.0000 mL | Freq: Three times a day (TID) | ORAL | Status: DC
  Administered 2019-12-05 – 2019-12-08 (×11): 30 mL via ORAL
  Filled 2019-12-05 (×11): qty 30

## 2019-12-05 MED ORDER — MUPIROCIN 2 % EX OINT
TOPICAL_OINTMENT | Freq: Two times a day (BID) | CUTANEOUS | Status: DC
  Filled 2019-12-05 (×2): qty 22

## 2019-12-05 MED ORDER — GABAPENTIN 300 MG PO CAPS
300.0000 mg | ORAL_CAPSULE | Freq: Three times a day (TID) | ORAL | Status: DC
  Administered 2019-12-05 – 2019-12-08 (×10): 300 mg via ORAL
  Filled 2019-12-05 (×10): qty 1

## 2019-12-05 MED ORDER — JUVEN PO PACK
1.0000 | PACK | Freq: Two times a day (BID) | ORAL | Status: DC
  Administered 2019-12-05 – 2019-12-08 (×8): 1 via ORAL
  Filled 2019-12-05 (×8): qty 1

## 2019-12-05 MED ORDER — CALCIUM GLUCONATE-NACL 1-0.675 GM/50ML-% IV SOLN
1.0000 g | Freq: Once | INTRAVENOUS | Status: AC
  Administered 2019-12-05: 08:00:00 1000 mg via INTRAVENOUS
  Filled 2019-12-05: qty 50

## 2019-12-05 MED ORDER — SODIUM CHLORIDE 0.9 % IV SOLN
INTRAVENOUS | Status: DC | PRN
  Administered 2019-12-05: 500 mL via INTRAVENOUS
  Administered 2019-12-06: 250 mL via INTRAVENOUS
  Administered 2019-12-07: 500 mL via INTRAVENOUS

## 2019-12-05 MED ORDER — DICLOFENAC SODIUM 1 % EX GEL
4.0000 g | Freq: Four times a day (QID) | CUTANEOUS | Status: DC | PRN
  Filled 2019-12-05: qty 100

## 2019-12-05 MED ORDER — ALBUTEROL SULFATE (2.5 MG/3ML) 0.083% IN NEBU
5.0000 mg | INHALATION_SOLUTION | Freq: Once | RESPIRATORY_TRACT | Status: AC
  Administered 2019-12-05: 09:00:00 5 mg via RESPIRATORY_TRACT
  Filled 2019-12-05: qty 6

## 2019-12-05 MED ORDER — TORSEMIDE 20 MG PO TABS
20.0000 mg | ORAL_TABLET | Freq: Every day | ORAL | Status: DC
  Administered 2019-12-06 – 2019-12-08 (×3): 20 mg via ORAL
  Filled 2019-12-05 (×3): qty 1

## 2019-12-05 MED ORDER — SODIUM ZIRCONIUM CYCLOSILICATE 10 G PO PACK
10.0000 g | PACK | Freq: Three times a day (TID) | ORAL | Status: DC
  Administered 2019-12-05 – 2019-12-06 (×4): 10 g via ORAL
  Filled 2019-12-05 (×5): qty 1

## 2019-12-05 MED ORDER — ALBUTEROL SULFATE (2.5 MG/3ML) 0.083% IN NEBU: 3.0000 mL | INHALATION_SOLUTION | RESPIRATORY_TRACT | Status: DC | PRN

## 2019-12-05 MED ORDER — OCUVITE-LUTEIN PO CAPS
1.0000 | ORAL_CAPSULE | Freq: Every day | ORAL | Status: DC
  Administered 2019-12-05 – 2019-12-08 (×4): 1 via ORAL
  Filled 2019-12-05 (×4): qty 1

## 2019-12-05 NOTE — Progress Notes (Signed)
Initial Nutrition Assessment  DOCUMENTATION CODES:   Morbid obesity  INTERVENTION:  Ensure Max po BID, each supplement provides 150 kcal and 30 grams of protein  Prostat 30 ml po TID, each supplement provides 100 kcal and 15 grams of protein  1 packet Juven BID, each packet provides 95 calories, 2.5 grams of protein (collagen), and 9.8 grams of carbohydrate (3 grams sugar); also contains 7 grams of L-arginine and L-glutamine, 300 mg vitamin C, 15 mg vitamin E, 1.2 mcg vitamin B-12, 9.5 mg zinc, 200 mg calcium, and 1.5 g  Calcium Beta-hydroxy-Beta-methylbutyrate to support wound healing  Ocuvite po daily, MVI provides 200 mg vit c, 40 mg zinc, 55 mcg selenium, 2 mg copper, 2 mg lutein to promote wound healing   NUTRITION DIAGNOSIS:   Increased nutrient needs related to chronic illness, wound healing(morbid obesity, chronic non-healing wounds) as evidenced by estimated needs.  GOAL:   Patient will meet greater than or equal to 90% of their needs   MONITOR:   PO intake, Weight trends, Supplement acceptance, Skin, I & O's, Labs  REASON FOR ASSESSMENT:   Consult Wound healing  ASSESSMENT:  RD working remotely.  59 year old male with past medical history significant of morbid obesity, chronic respiratory failure on 4 L [place] at baseline, OSA, IDDM2, CKD stage III, chronic pressure wounds with recurrent cellulitis and panniculitis followed by outpt wound clinic presents with increased weakness after a fall while getting into his care and SOB. In ED, pt noted with increased oxygen requirement from 4 to 6 L, CXR revealed volume overload, and initially hypotensive but responded to fluid bolus, K 6.3, Hgb 7.5 down form baseline and patient admitted for AKI on CKD stage IV, hyperkalemia, and infected right lower extremity wound.  RD consulted for wound healing.  Per chart, patient ambulates with a walker at baseline and has been bedbound over the past week s/p fall. He is on a HH/CM diet, no  documented meals at this time for review.   Mild pitting BLE edema noted per RN assessment Current wt 616.66 lbs Limited weight history for review, pt weighed 591.8 lbs at [facility] on 07/11/19, and 574.86 lbs in 2017.  I/Os: +2902 ml since admit UOP: 200 ml since admit  Medications reviewed and include: colace, ferrous sulfate, SSI, Lantus, protonix, lokelma IVF: NaCl IVPB: Rocephin, Flagyl, Calcium gluconate Labs: CBGs 111,142, K 6.7 (H), BUN 58 (H), Cr 3.39 (H), Corrected Ca 9.6 (WNL), BNP 735 (H)   NUTRITION - FOCUSED PHYSICAL EXAM: Unable to complete at this time, RD working remotely.  Diet Order:   Diet Order            Diet heart healthy/carb modified Room service appropriate? Yes; Fluid consistency: Thin  Diet effective now              EDUCATION NEEDS:   Not appropriate for education at this time  Skin:  Skin Assessment: Skin Integrity Issues: Skin Integrity Issues:: Diabetic Ulcer, Other (Comment) Diabetic Ulcer: open; abdomen Other: non-pressure wound; raw weeping; R leg, cellulitis; R/L leg; MASD; breast; buttocks; abdomen; perineum  Last BM:  4/15  Height:   Ht Readings from Last 1 Encounters:  12/05/19 5\' 11"  (1.803 m)    Weight:   Wt Readings from Last 1 Encounters:  12/05/19 (!) 280.3 kg    Ideal Body Weight:  78.2 kg  BMI:  Body mass index is 86.19 kg/m.  Estimated Nutritional Needs:   Kcal:  6834-1962 (MSJ minus [phone] kcal)  Protein:  156-172 (2 - 2.2 g/kg/IBW)  Fluid:  per MD   Lajuan Lines, RD, LDN Clinical Nutrition After Hours/Weekend Pager # in [phone]

## 2019-12-05 NOTE — Progress Notes (Signed)
Pt set up with home CPAP full face mask with 4L oxygen bled-in.  Tolerated well at this time.  RT will continue to monitor.

## 2019-12-05 NOTE — TOC Progression Note (Signed)
Transition of Care [facility]) - Progression Note    Patient Details  Name: ASBERRY LASCOLA MRN: [id number] Date of Birth: 1960-09-27  Transition of Care [facility]) CM/SW Contact  Boneta Lucks, RN Phone Number: 12/05/2019, 11:43 AM  Clinical Narrative:    TOC spoke with [facility] nurse - Heidi.  They have not been able to obtain an order from PCP for hospital bed. Blake Divine with [facility] is accepting the referral. MD updated to sign note needed.  TOC left a message on patient cell phone to update him.   Expected Discharge Plan: [place] Barriers to Discharge: Continued Medical Work up  Expected Discharge Plan and Services Expected Discharge Plan: [place]                  DME Arranged: Hospital bed DME Agency: [facility] Date DME Agency Contacted: 12/05/19 Time DME Agency Contacted: [id number] Representative spoke with at DME Agency: Blake Divine

## 2019-12-05 NOTE — Consult Note (Signed)
[facility] Consult   Reason for Consult: Right leg and left leg wounds  Referring Physician:  Dr. Wynetta Emery   Chief Complaint    Shortness of Breath      HPI: [name] is a 59 y.o. male with super morbid obesity, diabetes, chronic respiratory failure on O2, acute on chronic renal failure who comes into the hospital with worsening weakness and SOB. He fell a week ago getting from his car and has a new wound on the left thigh area. He says that since that fall he has been unable to ambulate and previously he was walking with a walker.  He has chronic right leg wounds and follows at the wound center at [facility].  He says that his wounds are normally treated with honey and a wrap at the wound center.  He says he goes at least once a month.    Past Medical History:  Diagnosis Date  . Cellulitis   . Diabetes mellitus without complication ([place])   . Hypertension   . On home oxygen therapy     Past Surgical History:  Procedure Laterality Date  . CORONARY ANGIOPLASTY      History reviewed. No pertinent family history.  Social History   Tobacco Use  . Smoking status: Never Smoker  . Smokeless tobacco: Never Used  Substance Use Topics  . Alcohol use: No    Alcohol/week: 0.0 standard drinks  . Drug use: No    Medications:  I have reviewed the patient's current medications. Prior to Admission:  Medications Prior to Admission  Medication Sig Dispense Refill Last Dose  . albuterol (VENTOLIN HFA) 108 (90 Base) MCG/ACT inhaler Inhale 2 puffs by mouth every 6 hours as needed for wheezing     . amLODipine (NORVASC) 5 MG tablet Take 1 Tablet by mouth once daily   12/03/2019 at Unknown time  . atorvastatin (LIPITOR) 10 MG tablet Take 1 Tablet by mouth once daily   12/03/2019 at Unknown time  . buPROPion (WELLBUTRIN XL) 300 MG 24 hr tablet Take 300 mg by mouth daily.    12/03/2019 at Unknown time  . cyanocobalamin (,VITAMIN B-12,) 1000 MCG/ML injection inject ONE ML  into THE muscle every 30 DAYS   Past Month at Unknown time  . diazepam (VALIUM) 10 MG tablet Take 10 mg by mouth every 8 (eight) hours as needed. for anxiety  2 12/03/2019 at Unknown time  . diclofenac Sodium (VOLTAREN) 1 % GEL APPLY 4 G TOPICALLY 4 (FOUR) TIMES DAILY. 500 g 5 12/03/2019 at Unknown time  . fenofibrate 160 MG tablet Take 1 Tablet by mouth once daily   12/03/2019 at Unknown time  . ferrous sulfate 325 (65 FE) MG tablet Take by mouth.   12/03/2019 at Unknown time  . gabapentin (NEURONTIN) 300 MG capsule Take 300 mg by mouth 3 (three) times daily.   12/03/2019 at Unknown time  . hydrochlorothiazide (HYDRODIURIL) 25 MG tablet Take 25 mg by mouth daily.   12/03/2019 at Unknown time  . ibuprofen (ADVIL) 800 MG tablet Take 800 mg by mouth every 8 (eight) hours as needed.   12/03/2019 at Unknown time  . insulin aspart (NOVOLOG) 100 UNIT/ML FlexPen 60-70 Units 3 (three) times daily with meals. Sliding scale   12/03/2019 at Unknown time  . Insulin Glargine (LANTUS SOLOSTAR) 100 UNIT/ML Solostar Pen inject 82 units subcutaneously AT bedtime   12/03/2019 at Unknown time  . levothyroxine (SYNTHROID) 200 MCG tablet Take 200 mcg by mouth daily.  12/03/2019 at Unknown time  . losartan (COZAAR) 100 MG tablet Take 100 mg by mouth daily.   12/03/2019 at Unknown time  . metoprolol tartrate (LOPRESSOR) 50 MG tablet Take 50 mg by mouth 2 (two) times daily.   12/03/2019 at 2100  . Multiple Vitamin (MULTIVITAMIN WITH MINERALS) TABS tablet Take 1 tablet by mouth daily.   12/03/2019 at Unknown time  . oxyCODONE (ROXICODONE) 15 MG immediate release tablet One tablet every four to six hours for pain.  Must last 30 days. 160 tablet 0 12/03/2019 at Unknown time  . OXYGEN Inhale into the lungs.   12/13/2019 at Unknown time  . pantoprazole (PROTONIX) 40 MG tablet Take 40 mg by mouth daily.   12/03/2019 at Unknown time  . torsemide (DEMADEX) 20 MG tablet Take 20 mg by mouth daily.   12/03/2019 at Unknown time  . Vitamin D,  Ergocalciferol, (DRISDOL) 50000 units CAPS capsule Take 1 Capsule by mouth 2 times a week   Past Week at Unknown time  . cephALEXin (KEFLEX) 500 MG capsule TAKE ONE CAPSULE (500 MG DOSE) BY MOUTH 4 (FOUR) TIMES DAILY FOR 10 DAYS. (Patient not taking: Reported on 11/20/2019) 40 capsule 3 Completed Course at Unknown time   Scheduled: . atorvastatin  10 mg Oral q1800  . buPROPion  300 mg Oral Daily  . Chlorhexidine Gluconate Cloth  6 each Topical Daily  . docusate sodium  100 mg Oral BID  . feeding supplement (PRO-STAT SUGAR FREE 64)  30 mL Oral TID  . fenofibrate  160 mg Oral Daily  . ferrous sulfate  325 mg Oral BID WC  . heparin  5,000 Units Subcutaneous Q8H  . insulin aspart  0-20 Units Subcutaneous TID WC  . insulin aspart  0-5 Units Subcutaneous QHS  . insulin aspart  6 Units Subcutaneous TID WC  . insulin glargine  70 Units Subcutaneous Q2200  . levothyroxine  200 mcg Oral Daily  . multivitamin-lutein  1 capsule Oral Daily  . mupirocin ointment   Nasal BID  . nutrition supplement (JUVEN)  1 packet Oral BID BM  . pantoprazole  40 mg Oral Daily  . Ensure Max Protein  11 oz Oral BID  . sodium zirconium cyclosilicate  10 g Oral TID   Continuous: . sodium chloride 10 mL/hr at 12/05/19 1516  . cefTRIAXone (ROCEPHIN)  IV Stopped (12/01/2019 2249)   And  . metronidazole Stopped (12/05/19 1130)   DXA:JOINOM chloride, acetaminophen **OR** acetaminophen, bisacodyl, diazepam, hydrALAZINE, magnesium citrate, oxyCODONE  No Known Allergies   ROS:  A comprehensive review of systems was negative except for: Constitutional: positive for weakness Respiratory: positive for shortness of breath Musculoskeletal: positive for chronic leg wounds and swelling/ drainage  Blood pressure 110/65, pulse 83, temperature (!) 97.4 F (36.3 C), temperature source Oral, resp. rate (!) 25, height 5\' 11"  (1.803 m), weight (!) 280.3 kg, SpO2 95 %. Physical Exam Vitals reviewed.  Constitutional:       Appearance: He is obese.  HENT:     Head: Normocephalic.  Cardiovascular:     Rate and Rhythm: Normal rate.  Pulmonary:     Effort: Pulmonary effort is normal.  Abdominal:     Comments: Obese, large pannus  Musculoskeletal:     Right lower leg: Tenderness present. Edema present.     Left lower leg: Tenderness present. Edema present.     Comments: Chronic RLE wounds and lymphedema, large amount of drainage posteriorly, woody fibrosis of the skin, flaking anterior shin skin,  Left thigh would with adipose at base, small area of necrotic fat, no drainage noted, no erythema   Neurological:     General: No focal deficit present.     Mental Status: He is alert.  Psychiatric:        Mood and Affect: Mood normal.        Behavior: Behavior normal.     Results: Results for orders placed or performed during the hospital encounter of 11/25/2019 (from the past 48 hour(s))  Brain natriuretic peptide     Status: Abnormal   Collection Time: 11/23/2019  3:00 PM  Result Value Ref Range   B Natriuretic Peptide 735.0 (H) 0.0 - 100.0 pg/mL    Comment: Performed at [facility], [address] [place], [place] [address]  CBC with Differential     Status: Abnormal   Collection Time: 12/19/2019  3:00 PM  Result Value Ref Range   WBC 23.2 (H) 4.0 - 10.5 K/uL   RBC 2.79 (L) 4.22 - 5.81 MIL/uL   Hemoglobin 7.5 (L) 13.0 - 17.0 g/dL   HCT 26.4 (L) 39.0 - 52.0 %   MCV 94.6 80.0 - 100.0 fL   MCH 26.9 26.0 - 34.0 pg   MCHC 28.4 (L) 30.0 - 36.0 g/dL   RDW 15.9 (H) 11.5 - 15.5 %   Platelets 357 150 - 400 K/uL   nRBC 0.0 0.0 - 0.2 %   Neutrophils Relative % 80 %   Neutro Abs 18.7 (H) 1.7 - 7.7 K/uL   Lymphocytes Relative 4 %   Lymphs Abs 0.9 0.7 - 4.0 K/uL   Monocytes Relative 13 %   Monocytes Absolute 3.0 (H) 0.1 - 1.0 K/uL   Eosinophils Relative 1 %   Eosinophils Absolute 0.2 0.0 - 0.5 K/uL   Basophils Relative 1 %   Basophils Absolute 0.1 0.0 - 0.1 K/uL   Immature Granulocytes 1 %   Abs Immature  Granulocytes 0.25 (H) 0.00 - 0.07 K/uL    Comment: Performed at [facility], [address] [place], [place] [address]  Comprehensive metabolic panel     Status: Abnormal   Collection Time: 12/07/2019  3:00 PM  Result Value Ref Range   Sodium 134 (L) 135 - 145 mmol/L   Potassium 6.3 (HH) 3.5 - 5.1 mmol/L    Comment: CRITICAL RESULT CALLED TO, READ BACK BY AND VERIFIED WITH: TALBOTT,T ON 11/25/2019 AT 1600 BY LOY,C    Chloride 103 98 - 111 mmol/L   CO2 21 (L) 22 - 32 mmol/L   Glucose, Bld 203 (H) 70 - 99 mg/dL    Comment: Glucose reference range applies only to samples taken after fasting for at least 8 hours.   BUN 59 (H) 6 - 20 mg/dL   Creatinine, Ser 3.30 (H) 0.61 - 1.24 mg/dL   Calcium 8.4 (L) 8.9 - 10.3 mg/dL   Total Protein 6.6 6.5 - 8.1 g/dL   Albumin 2.5 (L) 3.5 - 5.0 g/dL   AST 19 15 - 41 U/L   ALT 12 0 - 44 U/L   Alkaline Phosphatase 61 38 - 126 U/L   Total Bilirubin 0.9 0.3 - 1.2 mg/dL   GFR calc non Af Amer 20 (L) >60 mL/min   GFR calc Af Amer 23 (L) >60 mL/min   Anion gap 10 5 - 15    Comment: Performed at [facility], [address] [place], [place] [address]  Lactic acid, plasma     Status: None   Collection Time:  11/29/2019  3:00 PM  Result Value Ref Range   Lactic Acid, Venous 1.3 0.5 - 1.9 mmol/L    Comment: Performed at [facility], [address], [place], [place] [address]  Troponin I (High Sensitivity)     Status: None   Collection Time: 12/18/2019  3:00 PM  Result Value Ref Range   Troponin I (High Sensitivity) 11 <18 ng/L    Comment: (NOTE) Elevated high sensitivity troponin I (hsTnI) values and significant  changes across serial measurements may suggest ACS but many other  chronic and acute conditions are known to elevate hsTnI results.  Refer to the Links section for chest pain algorithms and additional  guidance. Performed at [facility], [address] [place], [place] [address]   Ethanol     Status: None   Collection Time: 12/09/2019  3:00 PM  Result  Value Ref Range   Alcohol, Ethyl (B) <10 <10 mg/dL    Comment: (NOTE) Lowest detectable limit for serum alcohol is 10 mg/dL. For medical purposes only. Performed at [facility], [address], [place], [place] [address]   Blood culture (routine x 2)     Status: None (Preliminary result)   Collection Time: 11/23/2019  3:00 PM   Specimen: Right Antecubital; Blood  Result Value Ref Range   Specimen Description RIGHT ANTECUBITAL    Special Requests      BOTTLES DRAWN AEROBIC AND ANAEROBIC Blood Culture adequate volume   Culture      NO GROWTH < 24 HOURS Performed at [facility] [place], [place] [address]    Report Status PENDING   Urinalysis, Routine w reflex microscopic     Status: Abnormal   Collection Time: 11/24/2019  3:02 PM  Result Value Ref Range   Color, Urine YELLOW YELLOW   APPearance CLOUDY (A) CLEAR   Specific Gravity, Urine 1.014 1.005 - 1.030   pH 5.0 5.0 - 8.0   Glucose, UA 50 (A) NEGATIVE mg/dL   Hgb urine dipstick NEGATIVE NEGATIVE   Bilirubin Urine NEGATIVE NEGATIVE   Ketones, ur NEGATIVE NEGATIVE mg/dL   Protein, ur 30 (A) NEGATIVE mg/dL   Nitrite NEGATIVE NEGATIVE   Leukocytes,Ua NEGATIVE NEGATIVE   RBC / HPF 0-5 0 - 5 RBC/hpf   WBC, UA 6-10 0 - 5 WBC/hpf   Bacteria, UA RARE (A) NONE SEEN   Squamous Epithelial / LPF 6-10 0 - 5   Mucus PRESENT    Hyaline Casts, UA PRESENT     Comment: Performed at [facility], [address], [place], [place] [address]  Rapid urine drug screen (hospital performed)     Status: Abnormal   Collection Time: 12/15/2019  3:11 PM  Result Value Ref Range   Opiates NONE DETECTED NONE DETECTED   Cocaine NONE DETECTED NONE DETECTED   Benzodiazepines POSITIVE (A) NONE DETECTED   Amphetamines NONE DETECTED NONE DETECTED   Tetrahydrocannabinol NONE DETECTED NONE DETECTED   Barbiturates NONE DETECTED NONE DETECTED    Comment: (NOTE) DRUG SCREEN FOR MEDICAL PURPOSES ONLY.  IF CONFIRMATION IS NEEDED FOR ANY PURPOSE,  NOTIFY LAB WITHIN 5 DAYS. LOWEST DETECTABLE LIMITS FOR URINE DRUG SCREEN Drug Class                     Cutoff (ng/mL) Amphetamine and metabolites    1000 Barbiturate and metabolites    200 Benzodiazepine                 299 Tricyclics and metabolites  300 Opiates and metabolites        300 Cocaine and metabolites        300 THC                            50 Performed at [facility], [address], [place], [place] [address]   SARS CORONAVIRUS 2 (TAT 6-24 HRS) Nasopharyngeal Nasopharyngeal Swab     Status: None   Collection Time: 11/20/2019  3:53 PM   Specimen: Nasopharyngeal Swab  Result Value Ref Range   SARS Coronavirus 2 NEGATIVE NEGATIVE    Comment: (NOTE) SARS-CoV-2 target nucleic acids are NOT DETECTED. The SARS-CoV-2 RNA is generally detectable in upper and lower respiratory specimens during the acute phase of infection. Negative results do not preclude SARS-CoV-2 infection, do not rule out co-infections with other pathogens, and should not be used as the sole basis for treatment or other patient management decisions. Negative results must be combined with clinical observations, patient history, and epidemiological information. The expected result is Negative. Fact Sheet for Patients: SugarRoll.be Fact Sheet for Healthcare Providers: https://www.woods-mathews.com/ This test is not yet approved or cleared by the [place] FDA and  has been authorized for detection and/or diagnosis of SARS-CoV-2 by FDA under an Emergency Use Authorization (EUA). This EUA will remain  in effect (meaning this test can be used) for the duration of the COVID-19 declaration under Section 56 4(b)(1) of the Act, 21 U.S.C. section 360bbb-3(b)(1), unless the authorization is terminated or revoked sooner. Performed at [facility], [place] [address] [place], [place] [address]   Blood culture (routine x 2)     Status: None (Preliminary result)    Collection Time: 12/07/2019  4:18 PM   Specimen: BLOOD RIGHT WRIST  Result Value Ref Range   Specimen Description BLOOD RIGHT WRIST    Special Requests      BOTTLES DRAWN AEROBIC AND ANAEROBIC Blood Culture adequate volume   Culture      NO GROWTH < 24 HOURS Performed at [facility], [address], [place], [place] [address]    Report Status PENDING   APTT     Status: None   Collection Time: 11/22/2019  4:18 PM  Result Value Ref Range   aPTT 32 24 - 36 seconds    Comment: Performed at [facility], [address] [place], [place] [address]  Protime-INR     Status: Abnormal   Collection Time: 12/03/2019  4:18 PM  Result Value Ref Range   Prothrombin Time 15.8 (H) 11.4 - 15.2 seconds   INR 1.3 (H) 0.8 - 1.2    Comment: (NOTE) INR goal varies based on device and disease states. Performed at [facility], [address], [place], [place] [address]   Lactic acid, plasma     Status: None   Collection Time: 11/20/2019  4:23 PM  Result Value Ref Range   Lactic Acid, Venous 1.1 0.5 - 1.9 mmol/L    Comment: Performed at [facility], [address] [place], [place] [address]  Troponin I (High Sensitivity)     Status: None   Collection Time: 12/11/2019  4:23 PM  Result Value Ref Range   Troponin I (High Sensitivity) 12 <18 ng/L    Comment: (NOTE) Elevated high sensitivity troponin I (hsTnI) values and significant  changes across serial measurements may suggest ACS but many other  chronic and acute conditions are known to elevate hsTnI results.  Refer to  the "Links" section for chest pain algorithms and additional  guidance. Performed at [facility], [address], [place], [place] [address]   Vitamin B12     Status: Abnormal   Collection Time: 12/13/2019  4:23 PM  Result Value Ref Range   Vitamin B-12 7,200 (H) 180 - 914 pg/mL    Comment: RESULTS CONFIRMED BY MANUAL DILUTION Performed at [facility], [address], [place], [place] [address]   Folate     Status: Abnormal   Collection  Time: 11/22/2019  4:23 PM  Result Value Ref Range   Folate 5.4 (L) >5.9 ng/mL    Comment: Performed at [facility], [address] [place], [place] [address]  Iron and TIBC     Status: Abnormal   Collection Time: 11/20/2019  4:23 PM  Result Value Ref Range   Iron 15 (L) 45 - 182 ug/dL   TIBC 450 250 - 450 ug/dL   Saturation Ratios 3 (L) 17.9 - 39.5 %   UIBC 435 ug/dL    Comment: Performed at [facility], [address], [place], [place] [address]  Ferritin     Status: None   Collection Time: 12/06/2019  4:23 PM  Result Value Ref Range   Ferritin 40 24 - 336 ng/mL    Comment: Performed at [facility], [address], [place], [place] [address]  Procalcitonin - Baseline     Status: None   Collection Time: 12/14/2019  4:23 PM  Result Value Ref Range   Procalcitonin 0.25 ng/mL    Comment:        Interpretation: PCT (Procalcitonin) <= 0.5 ng/mL: Systemic infection (sepsis) is not likely. Local bacterial infection is possible. (NOTE)       Sepsis PCT Algorithm           Lower Respiratory Tract                                      Infection PCT Algorithm    ----------------------------     ----------------------------         PCT < 0.25 ng/mL                PCT < 0.10 ng/mL         Strongly encourage             Strongly discourage   discontinuation of antibiotics    initiation of antibiotics    ----------------------------     -----------------------------       PCT 0.25 - 0.50 ng/mL            PCT 0.10 - 0.25 ng/mL               OR       >80% decrease in PCT            Discourage initiation of                                            antibiotics      Encourage discontinuation           of antibiotics    ----------------------------     -----------------------------         PCT >= 0.50 ng/mL  PCT 0.26 - 0.50 ng/mL               AND        <80% decrease in PCT             Encourage initiation of                                             antibiotics       Encourage  continuation           of antibiotics    ----------------------------     -----------------------------        PCT >= 0.50 ng/mL                  PCT > 0.50 ng/mL               AND         increase in PCT                  Strongly encourage                                      initiation of antibiotics    Strongly encourage escalation           of antibiotics                                     -----------------------------                                           PCT <= 0.25 ng/mL                                                 OR                                        > 80% decrease in PCT                                     Discontinue / Do not initiate                                             antibiotics Performed at [facility], [address] [place], [place] [address]   Respiratory Panel by RT PCR (Flu A&B, Covid) - Nasopharyngeal Swab     Status: None   Collection Time: 12/03/2019  7:19 PM   Specimen: Nasopharyngeal Swab  Result Value Ref Range   SARS Coronavirus 2 by RT PCR NEGATIVE NEGATIVE    Comment: (NOTE) SARS-CoV-2 target nucleic acids are NOT DETECTED. The SARS-CoV-2 RNA is generally detectable in upper respiratoy specimens during the  acute phase of infection. The lowest concentration of SARS-CoV-2 viral copies this assay can detect is 131 copies/mL. A negative result does not preclude SARS-Cov-2 infection and should not be used as the sole basis for treatment or other patient management decisions. A negative result may occur with  improper specimen collection/handling, submission of specimen other than nasopharyngeal swab, presence of viral mutation(s) within the areas targeted by this assay, and inadequate number of viral copies (<131 copies/mL). A negative result must be combined with clinical observations, patient history, and epidemiological information. The expected result is Negative. Fact Sheet for Patients:   PinkCheek.be Fact Sheet for Healthcare Providers:  GravelBags.it This test is not yet ap proved or cleared by the [place] FDA and  has been authorized for detection and/or diagnosis of SARS-CoV-2 by FDA under an Emergency Use Authorization (EUA). This EUA will remain  in effect (meaning this test can be used) for the duration of the COVID-19 declaration under Section 564(b)(1) of the Act, 21 U.S.C. section 360bbb-3(b)(1), unless the authorization is terminated or revoked sooner.    Influenza A by PCR NEGATIVE NEGATIVE   Influenza B by PCR NEGATIVE NEGATIVE    Comment: (NOTE) The Xpert Xpress SARS-CoV-2/FLU/RSV assay is intended as an aid in  the diagnosis of influenza from Nasopharyngeal swab specimens and  should not be used as a sole basis for treatment. Nasal washings and  aspirates are unacceptable for Xpert Xpress SARS-CoV-2/FLU/RSV  testing. Fact Sheet for Patients: PinkCheek.be Fact Sheet for Healthcare Providers: GravelBags.it This test is not yet approved or cleared by the [place] FDA and  has been authorized for detection and/or diagnosis of SARS-CoV-2 by  FDA under an Emergency Use Authorization (EUA). This EUA will remain  in effect (meaning this test can be used) for the duration of the  Covid-19 declaration under Section 564(b)(1) of the Act, 21  U.S.C. section 360bbb-3(b)(1), unless the authorization is  terminated or revoked. Performed at [facility], [address], , [place] [address]   Hemoglobin A1c     Status: Abnormal   Collection Time: 12/10/2019  9:50 PM  Result Value Ref Range   Hgb A1c MFr Bld 6.4 (H) 4.8 - 5.6 %    Comment: (NOTE) Pre diabetes:          5.7%-6.4% Diabetes:              >6.4% Glycemic control for   <7.0% adults with diabetes    Mean Plasma Glucose 136.98 mg/dL    Comment: Performed at [facility] [address] [place], [place] [address]  Reticulocytes     Status: Abnormal   Collection Time: 12/13/2019  9:50 PM  Result Value Ref Range   Retic Ct Pct 3.5 (H) 0.4 - 3.1 %   RBC. 2.75 (L) 4.22 - 5.81 MIL/uL   Retic Count, Absolute 96.3 19.0 - 186.0 K/uL   Immature Retic Fract 29.9 (H) 2.3 - 15.9 %    Comment: Performed at [facility], [address], [place], [place] [address]  Basic metabolic panel     Status: Abnormal   Collection Time: 12/07/2019  9:50 PM  Result Value Ref Range   Sodium 135 135 - 145 mmol/L   Potassium 5.8 (H) 3.5 - 5.1 mmol/L   Chloride 104 98 - 111 mmol/L   CO2 23 22 - 32 mmol/L   Glucose, Bld 160 (H) 70 - 99 mg/dL    Comment: Glucose reference range applies only to samples  taken after fasting for at least 8 hours.   BUN 57 (H) 6 - 20 mg/dL   Creatinine, Ser 3.32 (H) 0.61 - 1.24 mg/dL   Calcium 8.4 (L) 8.9 - 10.3 mg/dL   GFR calc non Af Amer 20 (L) >60 mL/min   GFR calc Af Amer 23 (L) >60 mL/min   Anion gap 8 5 - 15    Comment: Performed at [facility], [address] [place], [place] [address]  CBG monitoring, ED     Status: Abnormal   Collection Time: 12/12/2019 11:10 PM  Result Value Ref Range   Glucose-Capillary 142 (H) 70 - 99 mg/dL    Comment: Glucose reference range applies only to samples taken after fasting for at least 8 hours.  MRSA PCR Screening     Status: Abnormal   Collection Time: 12/14/2019 11:53 PM   Specimen: Nasopharyngeal  Result Value Ref Range   MRSA by PCR POSITIVE (A) NEGATIVE    Comment:        The GeneXpert MRSA Assay (FDA approved for NASAL specimens only), is one component of a comprehensive MRSA colonization surveillance program. It is not intended to diagnose MRSA infection nor to guide or monitor treatment for MRSA infections. RESULT CALLED TO, READ BACK BY AND VERIFIED WITH: FOLEY,B AT 2831 ON 12/05/19 BY HUFFINES,S Performed at [facility], [address], [place], [place] [address]   Basic metabolic  panel     Status: Abnormal   Collection Time: 12/05/19  4:26 AM  Result Value Ref Range   Sodium 136 135 - 145 mmol/L   Potassium 6.7 (HH) 3.5 - 5.1 mmol/L    Comment: CRITICAL RESULT CALLED TO, READ BACK BY AND VERIFIED WITH: HEARN,J AT 5:30AM ON 12/05/19 BY FESTERMAN,C    Chloride 106 98 - 111 mmol/L   CO2 22 22 - 32 mmol/L   Glucose, Bld 156 (H) 70 - 99 mg/dL    Comment: Glucose reference range applies only to samples taken after fasting for at least 8 hours.   BUN 58 (H) 6 - 20 mg/dL   Creatinine, Ser 3.39 (H) 0.61 - 1.24 mg/dL   Calcium 8.4 (L) 8.9 - 10.3 mg/dL   GFR calc non Af Amer 19 (L) >60 mL/min   GFR calc Af Amer 22 (L) >60 mL/min   Anion gap 8 5 - 15    Comment: Performed at [facility], [address], [place], [place] [address]  CBC     Status: Abnormal   Collection Time: 12/05/19  4:26 AM  Result Value Ref Range   WBC 16.5 (H) 4.0 - 10.5 K/uL   RBC 2.72 (L) 4.22 - 5.81 MIL/uL   Hemoglobin 7.4 (L) 13.0 - 17.0 g/dL   HCT 27.6 (L) 39.0 - 52.0 %   MCV 101.5 (H) 80.0 - 100.0 fL    Comment: DELTA CHECK NOTED REPEATED TO VERIFY    MCH 27.2 26.0 - 34.0 pg   MCHC 26.8 (L) 30.0 - 36.0 g/dL   RDW 16.3 (H) 11.5 - 15.5 %   Platelets 322 150 - 400 K/uL   nRBC 0.2 0.0 - 0.2 %    Comment: Performed at [facility], [address], [place], [place] [address]  CK     Status: None   Collection Time: 12/05/19  4:26 AM  Result Value Ref Range   Total CK 55 49 - 397 U/L    Comment: Performed at [facility], [address] [place], [place] [address]  Glucose, capillary     Status: Abnormal   Collection Time: 12/05/19  8:14 AM  Result Value Ref Range   Glucose-Capillary 111 (H) 70 - 99 mg/dL    Comment: Glucose reference range applies only to samples taken after fasting for at least 8 hours.  Renal function panel     Status: Abnormal   Collection Time: 12/05/19 10:06 AM  Result Value Ref Range   Sodium 138 135 - 145 mmol/L   Potassium 5.8 (H) 3.5 - 5.1 mmol/L   Chloride 105  98 - 111 mmol/L   CO2 24 22 - 32 mmol/L   Glucose, Bld 150 (H) 70 - 99 mg/dL    Comment: Glucose reference range applies only to samples taken after fasting for at least 8 hours.   BUN 59 (H) 6 - 20 mg/dL   Creatinine, Ser 3.54 (H) 0.61 - 1.24 mg/dL   Calcium 8.6 (L) 8.9 - 10.3 mg/dL   Phosphorus 5.2 (H) 2.5 - 4.6 mg/dL   Albumin 2.3 (L) 3.5 - 5.0 g/dL   GFR calc non Af Amer 18 (L) >60 mL/min   GFR calc Af Amer 21 (L) >60 mL/min   Anion gap 9 5 - 15    Comment: Performed at [facility], [address] [place], [place] [address]  Glucose, capillary     Status: Abnormal   Collection Time: 12/05/19 11:23 AM  Result Value Ref Range   Glucose-Capillary 164 (H) 70 - 99 mg/dL    Comment: Glucose reference range applies only to samples taken after fasting for at least 8 hours.    DG Tibia/Fibula Right  Result Date: 12/06/2019 CLINICAL DATA:  Fall last week with persistent right lower leg pain. EXAM: RIGHT TIBIA AND FIBULA - 2 VIEW COMPARISON:  Right knee 04/13/2010 FINDINGS: Moderate osteoarthritic change of the medial and lateral compartments of the knee. No acute fracture or dislocation. Linear metallic artifact over the AP view along the lateral aspect of the distal fibula. No acute fracture or dislocation. Subtle calcifications over the soft tissues of the lower leg possibly venous. IMPRESSION: No acute findings. Moderate osteoarthritis of the right knee. Electronically Signed   By: Marin Olp M.D.   On: 11/29/2019 15:43   US Renal  Result Date: 12/05/2019 CLINICAL DATA:  Morbid obesity.  Chronic kidney disease. EXAM: RENAL / URINARY TRACT ULTRASOUND COMPLETE COMPARISON:  12/24/2014 FINDINGS: Right Kidney: Renal measurements: 11.8 x 6.4 x 7.8 cm = volume: 313 mL . Echogenicity within normal limits. No solid mass or hydronephrosis visualized. 4 x 3 x 4 cm anechoic right renal mass with increased through transmission consistent with a cyst. Left Kidney: Renal measurements: 13.4 x 6.5 x 6.5 cm  = volume: 303 mL. Echogenicity within normal limits. No mass or hydronephrosis visualized. Bladder: Not visualized Other: None. IMPRESSION: 1. No obstructive uropathy. 2. Right renal cyst. Electronically Signed   By: Kathreen Devoid   On: 12/05/2019 15:24   DG Chest Port 1 View  Result Date: 12/13/2019 CLINICAL DATA:  Fall last week.  Shortness of breath. EXAM: PORTABLE CHEST 1 VIEW COMPARISON:  None. FINDINGS: Lungs are adequately inflated with mild hazy opacification over the medial right mid to lower lung suggesting atelectasis and possible small amount layering pleural fluid. Minimal hazy prominence of the perihilar vessels. Moderate cardiomegaly. Remainder of the exam is unremarkable. IMPRESSION: Moderate cardiomegaly with suggestion mild vascular congestion. Hazy density over the medial right mid to lower lung likely atelectasis with small amount of layering pleural  fluid. Electronically Signed   By: Marin Olp M.D.   On: 12/18/2019 15:36   DG Shoulder Right Portable  Result Date: 11/29/2019 CLINICAL DATA:  Fall last week with persistent right shoulder pain. EXAM: PORTABLE RIGHT SHOULDER COMPARISON:  None. FINDINGS: Mild degenerative change of the [facility] joint. No acute fracture or dislocation. IMPRESSION: No acute findings. Electronically Signed   By: Marin Olp M.D.   On: 12/11/2019 15:37     Assessment & Plan:  DAMAR PETIT is a 59 y.o. male with chronic RLE wounds and a new LLE wound that occurred with a fall. No surgical intervention indicated.   -Agree with wound care RN recommendations for RLQ -Left thigh saline dampened gauze and cover with ABD qshift -Can follow up with wound center at [facility] as prior   All questions were answered to the satisfaction of the patient. Updated Dr. Wynetta Emery.    Virl Cagey 12/05/2019, 3:36 PM

## 2019-12-05 NOTE — Progress Notes (Signed)
2 RN's attempted to place [name] catheter ordered by Nephrology. No success. Contacted hospitalist about seeing if we could keep the male purewick on the patient as it works well with the patient's anatomy and he prefers it. MD stated that was fine since it is working well for patient's I&O's and is less invasive and less risk of infection. Will continue to monitor.

## 2019-12-05 NOTE — Progress Notes (Signed)
Patient requires frequent re-positioning of the body in ways that cannot be achieved with  an ordinary bed or wedge pillow, to eliminate pain, reduce pressure, and the head of the  bed to be elevated more than 30 degrees most of the time due to Morbid obesity with BMI of 70 and over, Obstructive sleep apnea, HTN, DM type 2, CKD state 3.

## 2019-12-05 NOTE — Consult Note (Addendum)
[name]  HISTORY AND PHYSICAL  [name] is an 59 y.o. male.    Chief Complaint: SOB and weakness  HPI: Pt is a 75M with super morbid obesity, HTN, OHS/ OSA on 4L [place] O2 at baseline, chronic wounds followed by wound clinic, and CKD 3 with a baseline Cr of 1.7-2.0 (06/2019) who is now seen in consultation at the reqeust of Dr. Wynetta Emery for  eval and management of AKI on CKD and hyperkalemia.    Pt had a fall out of his car last week and was noted to have a L leg wound.  Increased SOB and O2 requirement from 4-6L.  AKI to 3.3, K uop to 6.3.  Came to ED, had a WBC ct of 23K, K of 6.3, increased drainage from wounds, and CXR with increased pulm edema.  Was given treatment for hyperK, LR, cultures, IV antibiotics (vanc/ cefepime/ flagyl) and was admitted.    This AM Cr is still 3.3 and K up to 6.7.  Renal US pending.  Pt notes that L leg is painful but no other complaints.  Cozaar, HCTZ, torsemide, ibuprofen on med list.  Cozaar being held as are diuretics.     PMH: Past Medical History:  Diagnosis Date  . Cellulitis   . Diabetes mellitus without complication ([place])   . Hypertension   . On home oxygen therapy    PSH: Past Surgical History:  Procedure Laterality Date  . CORONARY ANGIOPLASTY      Past Medical History:  Diagnosis Date  . Cellulitis   . Diabetes mellitus without complication ([place])   . Hypertension   . On home oxygen therapy     Medications:   Scheduled: . atorvastatin  10 mg Oral q1800  . buPROPion  300 mg Oral Daily  . Chlorhexidine Gluconate Cloth  6 each Topical Daily  . docusate sodium  100 mg Oral BID  . feeding supplement (PRO-STAT SUGAR FREE 64)  30 mL Oral TID  . fenofibrate  160 mg Oral Daily  . ferrous sulfate  325 mg Oral BID WC  . heparin  5,000 Units Subcutaneous Q8H  . insulin aspart  0-20 Units Subcutaneous TID WC  . insulin aspart  0-5 Units Subcutaneous QHS  . insulin aspart  6 Units Subcutaneous TID WC  . insulin  glargine  70 Units Subcutaneous Q2200  . levothyroxine  200 mcg Oral Daily  . multivitamin-lutein  1 capsule Oral Daily  . mupirocin ointment   Nasal BID  . nutrition supplement (JUVEN)  1 packet Oral BID BM  . pantoprazole  40 mg Oral Daily  . Ensure Max Protein  11 oz Oral BID  . sodium zirconium cyclosilicate  10 g Oral TID    Medications Prior to Admission  Medication Sig Dispense Refill  . albuterol (VENTOLIN HFA) 108 (90 Base) MCG/ACT inhaler Inhale 2 puffs by mouth every 6 hours as needed for wheezing    . amLODipine (NORVASC) 5 MG tablet Take 1 Tablet by mouth once daily    . atorvastatin (LIPITOR) 10 MG tablet Take 1 Tablet by mouth once daily    . buPROPion (WELLBUTRIN XL) 300 MG 24 hr tablet Take 300 mg by mouth daily.     . cyanocobalamin (,VITAMIN B-12,) 1000 MCG/ML injection inject ONE ML into THE muscle every 30 DAYS    . diazepam (VALIUM) 10 MG tablet Take 10 mg by mouth every 8 (eight) hours as needed. for anxiety  2  . diclofenac Sodium (  VOLTAREN) 1 % GEL APPLY 4 G TOPICALLY 4 (FOUR) TIMES DAILY. 500 g 5  . fenofibrate 160 MG tablet Take 1 Tablet by mouth once daily    . ferrous sulfate 325 (65 FE) MG tablet Take by mouth.    . gabapentin (NEURONTIN) 300 MG capsule Take 300 mg by mouth 3 (three) times daily.    . hydrochlorothiazide (HYDRODIURIL) 25 MG tablet Take 25 mg by mouth daily.    Marland Kitchen ibuprofen (ADVIL) 800 MG tablet Take 800 mg by mouth every 8 (eight) hours as needed.    . insulin aspart (NOVOLOG) 100 UNIT/ML FlexPen 60-70 Units 3 (three) times daily with meals. Sliding scale    . Insulin Glargine (LANTUS SOLOSTAR) 100 UNIT/ML Solostar Pen inject 82 units subcutaneously AT bedtime    . levothyroxine (SYNTHROID) 200 MCG tablet Take 200 mcg by mouth daily.    Marland Kitchen losartan (COZAAR) 100 MG tablet Take 100 mg by mouth daily.    . metoprolol tartrate (LOPRESSOR) 50 MG tablet Take 50 mg by mouth 2 (two) times daily.    . Multiple Vitamin (MULTIVITAMIN WITH MINERALS) TABS  tablet Take 1 tablet by mouth daily.    Marland Kitchen oxyCODONE (ROXICODONE) 15 MG immediate release tablet One tablet every four to six hours for pain.  Must last 30 days. 160 tablet 0  . OXYGEN Inhale into the lungs.    . pantoprazole (PROTONIX) 40 MG tablet Take 40 mg by mouth daily.    Marland Kitchen torsemide (DEMADEX) 20 MG tablet Take 20 mg by mouth daily.    . Vitamin D, Ergocalciferol, (DRISDOL) 50000 units CAPS capsule Take 1 Capsule by mouth 2 times a week    . cephALEXin (KEFLEX) 500 MG capsule TAKE ONE CAPSULE (500 MG DOSE) BY MOUTH 4 (FOUR) TIMES DAILY FOR 10 DAYS. (Patient not taking: Reported on 12/10/2019) 40 capsule 3    ALLERGIES:  No Known Allergies  FAM HX: History reviewed. No pertinent family history.  Social History:   reports that he has never smoked. He has never used smokeless tobacco. He reports that he does not drink alcohol or use drugs.  ROS: ROS: all other systems reviewed and are negative except as per HPI  Blood pressure (!) 110/44, pulse 71, temperature 98.2 F (36.8 C), temperature source Oral, resp. rate 19, height 5\' 11"  (1.803 m), weight (!) 280.3 kg, SpO2 (!) 89 %. PHYSICAL EXAM: Physical Exam  GEN: NAD, sitting in bed HEENT EOMI PERRL L eye, R eye with some matting NECK thick, cannot appreciate JVD PULM clear anteriorly CV RRR ABD + pannus with edema, some venous stasis, and cobblestoning on R side, 2 small ulcers on L side EXT 2+ LE edema with stasis, R leg with redness and chronic woulds with iodoform gauze on top, pictures reviewed in Epic, L leg with moisture wicking material in fold NEURO AAO x 3 nonfocal SKIN as above MSK no effusions   Results for orders placed or performed during the hospital encounter of 12/07/2019 (from the past 48 hour(s))  Brain natriuretic peptide     Status: Abnormal   Collection Time: 11/21/2019  3:00 PM  Result Value Ref Range   B Natriuretic Peptide 735.0 (H) 0.0 - 100.0 pg/mL    Comment: Performed at [facility], [address], [place], [place] [address]  CBC with Differential     Status: Abnormal   Collection Time: 12/05/2019  3:00 PM  Result Value Ref Range   WBC 23.2 (H) 4.0 - 10.5 K/uL  RBC 2.79 (L) 4.22 - 5.81 MIL/uL   Hemoglobin 7.5 (L) 13.0 - 17.0 g/dL   HCT 26.4 (L) 39.0 - 52.0 %   MCV 94.6 80.0 - 100.0 fL   MCH 26.9 26.0 - 34.0 pg   MCHC 28.4 (L) 30.0 - 36.0 g/dL   RDW 15.9 (H) 11.5 - 15.5 %   Platelets 357 150 - 400 K/uL   nRBC 0.0 0.0 - 0.2 %   Neutrophils Relative % 80 %   Neutro Abs 18.7 (H) 1.7 - 7.7 K/uL   Lymphocytes Relative 4 %   Lymphs Abs 0.9 0.7 - 4.0 K/uL   Monocytes Relative 13 %   Monocytes Absolute 3.0 (H) 0.1 - 1.0 K/uL   Eosinophils Relative 1 %   Eosinophils Absolute 0.2 0.0 - 0.5 K/uL   Basophils Relative 1 %   Basophils Absolute 0.1 0.0 - 0.1 K/uL   Immature Granulocytes 1 %   Abs Immature Granulocytes 0.25 (H) 0.00 - 0.07 K/uL    Comment: Performed at [facility], [address] [place], [place] [address]  Comprehensive metabolic panel     Status: Abnormal   Collection Time: 12/13/2019  3:00 PM  Result Value Ref Range   Sodium 134 (L) 135 - 145 mmol/L   Potassium 6.3 (HH) 3.5 - 5.1 mmol/L    Comment: CRITICAL RESULT CALLED TO, READ BACK BY AND VERIFIED WITH: TALBOTT,T ON 11/21/2019 AT 1600 BY LOY,C    Chloride 103 98 - 111 mmol/L   CO2 21 (L) 22 - 32 mmol/L   Glucose, Bld 203 (H) 70 - 99 mg/dL    Comment: Glucose reference range applies only to samples taken after fasting for at least 8 hours.   BUN 59 (H) 6 - 20 mg/dL   Creatinine, Ser 3.30 (H) 0.61 - 1.24 mg/dL   Calcium 8.4 (L) 8.9 - 10.3 mg/dL   Total Protein 6.6 6.5 - 8.1 g/dL   Albumin 2.5 (L) 3.5 - 5.0 g/dL   AST 19 15 - 41 U/L   ALT 12 0 - 44 U/L   Alkaline Phosphatase 61 38 - 126 U/L   Total Bilirubin 0.9 0.3 - 1.2 mg/dL   GFR calc non Af Amer 20 (L) >60 mL/min   GFR calc Af Amer 23 (L) >60 mL/min   Anion gap 10 5 - 15    Comment: Performed at [facility], [address] [place], [place] [address]   Lactic acid, plasma     Status: None   Collection Time: 12/02/2019  3:00 PM  Result Value Ref Range   Lactic Acid, Venous 1.3 0.5 - 1.9 mmol/L    Comment: Performed at [facility], [address] [place], [place] [address]  Troponin I (High Sensitivity)     Status: None   Collection Time: 12/16/2019  3:00 PM  Result Value Ref Range   Troponin I (High Sensitivity) 11 <18 ng/L    Comment: (NOTE) Elevated high sensitivity troponin I (hsTnI) values and significant  changes across serial measurements may suggest ACS but many other  chronic and acute conditions are known to elevate hsTnI results.  Refer to the Links section for chest pain algorithms and additional  guidance. Performed at [facility], [address], [place], [place] [address]   Ethanol     Status: None   Collection Time: 11/20/2019  3:00 PM  Result Value Ref Range   Alcohol, Ethyl (B) <10 <10 mg/dL    Comment: (NOTE) Lowest detectable limit for  serum alcohol is 10 mg/dL. For medical purposes only. Performed at [facility], [address], [place], [place] [address]   Blood culture (routine x 2)     Status: None (Preliminary result)   Collection Time: 12/03/2019  3:00 PM   Specimen: Right Antecubital; Blood  Result Value Ref Range   Specimen Description RIGHT ANTECUBITAL    Special Requests      BOTTLES DRAWN AEROBIC AND ANAEROBIC Blood Culture adequate volume   Culture      NO GROWTH < 24 HOURS Performed at [facility] [place], [place] [address]    Report Status PENDING   Urinalysis, Routine w reflex microscopic     Status: Abnormal   Collection Time: 12/01/2019  3:02 PM  Result Value Ref Range   Color, Urine YELLOW YELLOW   APPearance CLOUDY (A) CLEAR   Specific Gravity, Urine 1.014 1.005 - 1.030   pH 5.0 5.0 - 8.0   Glucose, UA 50 (A) NEGATIVE mg/dL   Hgb urine dipstick NEGATIVE NEGATIVE   Bilirubin Urine NEGATIVE NEGATIVE   Ketones, ur NEGATIVE NEGATIVE mg/dL   Protein, ur 30 (A) NEGATIVE mg/dL    Nitrite NEGATIVE NEGATIVE   Leukocytes,Ua NEGATIVE NEGATIVE   RBC / HPF 0-5 0 - 5 RBC/hpf   WBC, UA 6-10 0 - 5 WBC/hpf   Bacteria, UA RARE (A) NONE SEEN   Squamous Epithelial / LPF 6-10 0 - 5   Mucus PRESENT    Hyaline Casts, UA PRESENT     Comment: Performed at [facility], [address] [place], [place] [address]  Rapid urine drug screen (hospital performed)     Status: Abnormal   Collection Time: 11/29/2019  3:11 PM  Result Value Ref Range   Opiates NONE DETECTED NONE DETECTED   Cocaine NONE DETECTED NONE DETECTED   Benzodiazepines POSITIVE (A) NONE DETECTED   Amphetamines NONE DETECTED NONE DETECTED   Tetrahydrocannabinol NONE DETECTED NONE DETECTED   Barbiturates NONE DETECTED NONE DETECTED    Comment: (NOTE) DRUG SCREEN FOR MEDICAL PURPOSES ONLY.  IF CONFIRMATION IS NEEDED FOR ANY PURPOSE, NOTIFY LAB WITHIN 5 DAYS. LOWEST DETECTABLE LIMITS FOR URINE DRUG SCREEN Drug Class                     Cutoff (ng/mL) Amphetamine and metabolites    1000 Barbiturate and metabolites    200 Benzodiazepine                 371 Tricyclics and metabolites     300 Opiates and metabolites        300 Cocaine and metabolites        300 THC                            50 Performed at [facility], [address], [place], [place] [address]   SARS CORONAVIRUS 2 (TAT 6-24 HRS) Nasopharyngeal Nasopharyngeal Swab     Status: None   Collection Time: 12/10/2019  3:53 PM   Specimen: Nasopharyngeal Swab  Result Value Ref Range   SARS Coronavirus 2 NEGATIVE NEGATIVE    Comment: (NOTE) SARS-CoV-2 target nucleic acids are NOT DETECTED. The SARS-CoV-2 RNA is generally detectable in upper and lower respiratory specimens during the acute phase of infection. Negative results do not preclude SARS-CoV-2 infection, do not rule out co-infections with other pathogens, and should not be used as the sole basis for treatment or other patient  management decisions. Negative results must be combined with clinical  observations, patient history, and epidemiological information. The expected result is Negative. Fact Sheet for Patients: SugarRoll.be Fact Sheet for Healthcare Providers: https://www.woods-mathews.com/ This test is not yet approved or cleared by the [place] FDA and  has been authorized for detection and/or diagnosis of SARS-CoV-2 by FDA under an Emergency Use Authorization (EUA). This EUA will remain  in effect (meaning this test can be used) for the duration of the COVID-19 declaration under Section 56 4(b)(1) of the Act, 21 U.S.C. section 360bbb-3(b)(1), unless the authorization is terminated or revoked sooner. Performed at [facility], [employer] [address] [place], [place] [address]   Blood culture (routine x 2)     Status: None (Preliminary result)   Collection Time: 12/03/2019  4:18 PM   Specimen: BLOOD RIGHT WRIST  Result Value Ref Range   Specimen Description BLOOD RIGHT WRIST    Special Requests      BOTTLES DRAWN AEROBIC AND ANAEROBIC Blood Culture adequate volume   Culture      NO GROWTH < 24 HOURS Performed at [facility], [address] [place], [place] [address]    Report Status PENDING   APTT     Status: None   Collection Time: 11/30/2019  4:18 PM  Result Value Ref Range   aPTT 32 24 - 36 seconds    Comment: Performed at [facility], [address] [place], [place] [address]  Protime-INR     Status: Abnormal   Collection Time: 12/05/2019  4:18 PM  Result Value Ref Range   Prothrombin Time 15.8 (H) 11.4 - 15.2 seconds   INR 1.3 (H) 0.8 - 1.2    Comment: (NOTE) INR goal varies based on device and disease states. Performed at [facility], [address] [place], [place] [address]   Lactic acid, plasma     Status: None   Collection Time: 12/06/2019  4:23 PM  Result Value Ref Range   Lactic Acid, Venous 1.1 0.5 - 1.9 mmol/L    Comment: Performed at [facility], [address], [place], [place] [address]  Troponin I  (High Sensitivity)     Status: None   Collection Time: 12/13/2019  4:23 PM  Result Value Ref Range   Troponin I (High Sensitivity) 12 <18 ng/L    Comment: (NOTE) Elevated high sensitivity troponin I (hsTnI) values and significant  changes across serial measurements may suggest ACS but many other  chronic and acute conditions are known to elevate hsTnI results.  Refer to the "Links" section for chest pain algorithms and additional  guidance. Performed at [facility], [address] [place], [place] [address]   Vitamin B12     Status: Abnormal   Collection Time: 12/12/2019  4:23 PM  Result Value Ref Range   Vitamin B-12 7,200 (H) 180 - 914 pg/mL    Comment: RESULTS CONFIRMED BY MANUAL DILUTION Performed at [facility], [address], [place], [place] [address]   Folate     Status: Abnormal   Collection Time: 11/20/2019  4:23 PM  Result Value Ref Range   Folate 5.4 (L) >5.9 ng/mL    Comment: Performed at [facility], [address], [place],  [address]  Iron and TIBC     Status: Abnormal   Collection Time: 11/28/2019  4:23 PM  Result Value Ref Range   Iron 15 (L) 45 - 182 ug/dL   TIBC 450 250 - 450 ug/dL   Saturation Ratios  3 (L) 17.9 - 39.5 %   UIBC 435 ug/dL    Comment: Performed at [facility], [address], [place], [place] [address]  Ferritin     Status: None   Collection Time: 12/10/2019  4:23 PM  Result Value Ref Range   Ferritin 40 24 - 336 ng/mL    Comment: Performed at [facility], [address] [place], [place] [address]  Procalcitonin - Baseline     Status: None   Collection Time: 12/10/2019  4:23 PM  Result Value Ref Range   Procalcitonin 0.25 ng/mL    Comment:        Interpretation: PCT (Procalcitonin) <= 0.5 ng/mL: Systemic infection (sepsis) is not likely. Local bacterial infection is possible. (NOTE)       Sepsis PCT Algorithm           Lower Respiratory Tract                                      Infection PCT Algorithm    ----------------------------      ----------------------------         PCT < 0.25 ng/mL                PCT < 0.10 ng/mL         Strongly encourage             Strongly discourage   discontinuation of antibiotics    initiation of antibiotics    ----------------------------     -----------------------------       PCT 0.25 - 0.50 ng/mL            PCT 0.10 - 0.25 ng/mL               OR       >80% decrease in PCT            Discourage initiation of                                            antibiotics      Encourage discontinuation           of antibiotics    ----------------------------     -----------------------------         PCT >= 0.50 ng/mL              PCT 0.26 - 0.50 ng/mL               AND        <80% decrease in PCT             Encourage initiation of                                             antibiotics       Encourage continuation           of antibiotics    ----------------------------     -----------------------------        PCT >= 0.50 ng/mL                  PCT > 0.50 ng/mL  AND         increase in PCT                  Strongly encourage                                      initiation of antibiotics    Strongly encourage escalation           of antibiotics                                     -----------------------------                                           PCT <= 0.25 ng/mL                                                 OR                                        > 80% decrease in PCT                                     Discontinue / Do not initiate                                             antibiotics Performed at [facility], [address] [place], [place] [address]   Respiratory Panel by RT PCR (Flu A&B, Covid) - Nasopharyngeal Swab     Status: None   Collection Time: 12/11/2019  7:19 PM   Specimen: Nasopharyngeal Swab  Result Value Ref Range   SARS Coronavirus 2 by RT PCR NEGATIVE NEGATIVE    Comment: (NOTE) SARS-CoV-2 target nucleic acids are NOT DETECTED. The  SARS-CoV-2 RNA is generally detectable in upper respiratoy specimens during the acute phase of infection. The lowest concentration of SARS-CoV-2 viral copies this assay can detect is 131 copies/mL. A negative result does not preclude SARS-Cov-2 infection and should not be used as the sole basis for treatment or other patient management decisions. A negative result may occur with  improper specimen collection/handling, submission of specimen other than nasopharyngeal swab, presence of viral mutation(s) within the areas targeted by this assay, and inadequate number of viral copies (<131 copies/mL). A negative result must be combined with clinical observations, patient history, and epidemiological information. The expected result is Negative. Fact Sheet for Patients:  PinkCheek.be Fact Sheet for Healthcare Providers:  GravelBags.it This test is not yet ap proved or cleared by the [place] FDA and  has been authorized for detection and/or diagnosis of SARS-CoV-2 by FDA under an Emergency Use Authorization (EUA). This EUA will remain  in effect (meaning this test can be used) for the duration of the COVID-19 declaration under Section 564(b)(1)  of the Act, 21 U.S.C. section 360bbb-3(b)(1), unless the authorization is terminated or revoked sooner.    Influenza A by PCR NEGATIVE NEGATIVE   Influenza B by PCR NEGATIVE NEGATIVE    Comment: (NOTE) The Xpert Xpress SARS-CoV-2/FLU/RSV assay is intended as an aid in  the diagnosis of influenza from Nasopharyngeal swab specimens and  should not be used as a sole basis for treatment. Nasal washings and  aspirates are unacceptable for Xpert Xpress SARS-CoV-2/FLU/RSV  testing. Fact Sheet for Patients: PinkCheek.be Fact Sheet for Healthcare Providers: GravelBags.it This test is not yet approved or cleared by the [place] FDA  and  has been authorized for detection and/or diagnosis of SARS-CoV-2 by  FDA under an Emergency Use Authorization (EUA). This EUA will remain  in effect (meaning this test can be used) for the duration of the  Covid-19 declaration under Section 564(b)(1) of the Act, 21  U.S.C. section 360bbb-3(b)(1), unless the authorization is  terminated or revoked. Performed at [facility], [address], [place], [place] [address]   Hemoglobin A1c     Status: Abnormal   Collection Time: 12/02/2019  9:50 PM  Result Value Ref Range   Hgb A1c MFr Bld 6.4 (H) 4.8 - 5.6 %    Comment: (NOTE) Pre diabetes:          5.7%-6.4% Diabetes:              >6.4% Glycemic control for   <7.0% adults with diabetes    Mean Plasma Glucose 136.98 mg/dL    Comment: Performed at [facility] [address], [place], [place] [address]  Reticulocytes     Status: Abnormal   Collection Time: 11/29/2019  9:50 PM  Result Value Ref Range   Retic Ct Pct 3.5 (H) 0.4 - 3.1 %   RBC. 2.75 (L) 4.22 - 5.81 MIL/uL   Retic Count, Absolute 96.3 19.0 - 186.0 K/uL   Immature Retic Fract 29.9 (H) 2.3 - 15.9 %    Comment: Performed at [facility], [address], [place], [place] [address]  Basic metabolic panel     Status: Abnormal   Collection Time: 11/20/2019  9:50 PM  Result Value Ref Range   Sodium 135 135 - 145 mmol/L   Potassium 5.8 (H) 3.5 - 5.1 mmol/L   Chloride 104 98 - 111 mmol/L   CO2 23 22 - 32 mmol/L   Glucose, Bld 160 (H) 70 - 99 mg/dL    Comment: Glucose reference range applies only to samples taken after fasting for at least 8 hours.   BUN 57 (H) 6 - 20 mg/dL   Creatinine, Ser 3.32 (H) 0.61 - 1.24 mg/dL   Calcium 8.4 (L) 8.9 - 10.3 mg/dL   GFR calc non Af Amer 20 (L) >60 mL/min   GFR calc Af Amer 23 (L) >60 mL/min   Anion gap 8 5 - 15    Comment: Performed at [facility], [address], [place], [place] [address]  CBG monitoring, ED     Status: Abnormal   Collection Time: 12/07/2019 11:10 PM  Result Value  Ref Range   Glucose-Capillary 142 (H) 70 - 99 mg/dL    Comment: Glucose reference range applies only to samples taken after fasting for at least 8 hours.  MRSA PCR Screening     Status: Abnormal   Collection Time: 12/14/2019 11:53 PM   Specimen: Nasopharyngeal  Result Value Ref Range   MRSA by PCR POSITIVE (A) NEGATIVE  Comment:        The GeneXpert MRSA Assay (FDA approved for NASAL specimens only), is one component of a comprehensive MRSA colonization surveillance program. It is not intended to diagnose MRSA infection nor to guide or monitor treatment for MRSA infections. RESULT CALLED TO, READ BACK BY AND VERIFIED WITH: FOLEY,B AT 4270 ON 12/05/19 BY HUFFINES,S Performed at [facility], [address], [place], [place] [address]   Basic metabolic panel     Status: Abnormal   Collection Time: 12/05/19  4:26 AM  Result Value Ref Range   Sodium 136 135 - 145 mmol/L   Potassium 6.7 (HH) 3.5 - 5.1 mmol/L    Comment: CRITICAL RESULT CALLED TO, READ BACK BY AND VERIFIED WITH: HEARN,J AT 5:30AM ON 12/05/19 BY FESTERMAN,C    Chloride 106 98 - 111 mmol/L   CO2 22 22 - 32 mmol/L   Glucose, Bld 156 (H) 70 - 99 mg/dL    Comment: Glucose reference range applies only to samples taken after fasting for at least 8 hours.   BUN 58 (H) 6 - 20 mg/dL   Creatinine, Ser 3.39 (H) 0.61 - 1.24 mg/dL   Calcium 8.4 (L) 8.9 - 10.3 mg/dL   GFR calc non Af Amer 19 (L) >60 mL/min   GFR calc Af Amer 22 (L) >60 mL/min   Anion gap 8 5 - 15    Comment: Performed at [facility], [address], [place], [place] [address]  CK     Status: None   Collection Time: 12/05/19  4:26 AM  Result Value Ref Range   Total CK 55 49 - 397 U/L    Comment: Performed at [facility], [address], [place],  [address]  Glucose, capillary     Status: Abnormal   Collection Time: 12/05/19  8:14 AM  Result Value Ref Range   Glucose-Capillary 111 (H) 70 - 99 mg/dL    Comment: Glucose reference range applies only to  samples taken after fasting for at least 8 hours.    DG Tibia/Fibula Right  Result Date: 11/30/2019 CLINICAL DATA:  Fall last week with persistent right lower leg pain. EXAM: RIGHT TIBIA AND FIBULA - 2 VIEW COMPARISON:  Right knee 04/13/2010 FINDINGS: Moderate osteoarthritic change of the medial and lateral compartments of the knee. No acute fracture or dislocation. Linear metallic artifact over the AP view along the lateral aspect of the distal fibula. No acute fracture or dislocation. Subtle calcifications over the soft tissues of the lower leg possibly venous. IMPRESSION: No acute findings. Moderate osteoarthritis of the right knee. Electronically Signed   By: Marin Olp M.D.   On: 11/30/2019 15:43   DG Chest Port 1 View  Result Date: 12/11/2019 CLINICAL DATA:  Fall last week.  Shortness of breath. EXAM: PORTABLE CHEST 1 VIEW COMPARISON:  None. FINDINGS: Lungs are adequately inflated with mild hazy opacification over the medial right mid to lower lung suggesting atelectasis and possible small amount layering pleural fluid. Minimal hazy prominence of the perihilar vessels. Moderate cardiomegaly. Remainder of the exam is unremarkable. IMPRESSION: Moderate cardiomegaly with suggestion mild vascular congestion. Hazy density over the medial right mid to lower lung likely atelectasis with small amount of layering pleural fluid. Electronically Signed   By: Marin Olp M.D.   On: 11/22/2019 15:36   DG Shoulder Right Portable  Result Date: 11/28/2019 CLINICAL DATA:  Fall last week with persistent right shoulder pain. EXAM: PORTABLE RIGHT SHOULDER COMPARISON:  None. FINDINGS: Mild degenerative change  of the [facility] joint. No acute fracture or dislocation. IMPRESSION: No acute findings. Electronically Signed   By: Marin Olp M.D.   On: 12/07/2019 15:37    Assessment/Plan  1.  AKI on CKD: likely septic injury mediated by hypotension/ diuretics/ Cozaar as well.  Making good urine.  Will aggressively treat  hyperK.  He's a poor dialysis candidate based on habitus.  Will get a renal US.  Place Foley, strict I/O.  UA bland.   2.  Hyperkalemia: initially 6.3-->5.8-->6.7 this AM.  Received LR which contains potassium---> avoid.  Kayexelate and Ca gluc given by primary this AM, have added Lokelma TID and albuterol neb 5 mg.  Bicarb is 22, has already received amp.  Check PM K.     3.  Acute on chronic CHF exacerbation: got Lasix this AM--> will follow UOP and BP for redosing  4.  Sepsis: from wounds. Cultures, antibiotics per primary.  Of note, got 2500 mg vanc last night--> would recommend getting vanc level before redosing d/t likely overestimation of true GFR given size  5.  Dispo: pending  Madelon Lips 12/05/2019, 9:33 AM

## 2019-12-05 NOTE — Consult Note (Signed)
WOC Nurse Consult Note: Patient receiving care in AP ICU 7.  Consult completed remotely after review of record and wound images, notes from outpatient [facility], and phone discussion with primary RN, [place]. Reason for Consult: LE wound Wound type: chronic wound, associated with lymphedema and super morbid obesity Pressure Injury POA: Yes/No/NA Measurement: see flowsheet section of EMR Wound bed: see images Drainage (amount, consistency, odor) heavy Periwound: intact Dressing procedure/placement/frequency:  Wash RLE wound with soap and water. Pat dry. Apply as many Aquacel dressings Kellie Simmering [phone]) over the wound area as necessary to cover the site. Then place ABD pads, secure with kerlex, and wrap with an Ace Wrap from behind the toes, to above the wound area.  Change daily and prn soilage. Monitor the wound area(s) for worsening of condition such as: Signs/symptoms of infection,  Increase in size,  Development of or worsening of odor, Development of pain, or increased pain at the affected locations.  Notify the medical team if any of these develop.  Thank you for the consult.  Discussed plan of care with the bedside nurse.  [place] nurse will not follow at this time.  Please re-consult the [place] team if needed.  Val Riles, RN, MSN, CWOCN, CNS-BC, pager [phone]

## 2019-12-06 ENCOUNTER — Encounter (HOSPITAL_COMMUNITY): Payer: Self-pay | Admitting: Internal Medicine

## 2019-12-06 DIAGNOSIS — N179 Acute kidney failure, unspecified: Secondary | ICD-10-CM

## 2019-12-06 DIAGNOSIS — I471 Supraventricular tachycardia, unspecified: Secondary | ICD-10-CM | POA: Clinically undetermined

## 2019-12-06 DIAGNOSIS — E875 Hyperkalemia: Secondary | ICD-10-CM

## 2019-12-06 LAB — PREPARE RBC (CROSSMATCH)

## 2019-12-06 LAB — CBC WITH DIFFERENTIAL/PLATELET
Abs Immature Granulocytes: 0.1 10*3/uL — ABNORMAL HIGH (ref 0.00–0.07)
Basophils Absolute: 0.1 10*3/uL (ref 0.0–0.1)
Basophils Relative: 1 %
Eosinophils Absolute: 0.7 10*3/uL — ABNORMAL HIGH (ref 0.0–0.5)
Eosinophils Relative: 7 %
HCT: 25.1 % — ABNORMAL LOW (ref 39.0–52.0)
Hemoglobin: 6.9 g/dL — CL (ref 13.0–17.0)
Immature Granulocytes: 1 %
Lymphocytes Relative: 13 %
Lymphs Abs: 1.3 10*3/uL (ref 0.7–4.0)
MCH: 26.6 pg (ref 26.0–34.0)
MCHC: 27.5 g/dL — ABNORMAL LOW (ref 30.0–36.0)
MCV: 96.9 fL (ref 80.0–100.0)
Monocytes Absolute: 1.3 10*3/uL — ABNORMAL HIGH (ref 0.1–1.0)
Monocytes Relative: 13 %
Neutro Abs: 6.4 10*3/uL (ref 1.7–7.7)
Neutrophils Relative %: 65 %
Platelets: 333 10*3/uL (ref 150–400)
RBC: 2.59 MIL/uL — ABNORMAL LOW (ref 4.22–5.81)
RDW: 16.5 % — ABNORMAL HIGH (ref 11.5–15.5)
WBC: 9.9 10*3/uL (ref 4.0–10.5)
nRBC: 0 % (ref 0.0–0.2)

## 2019-12-06 LAB — GLUCOSE, CAPILLARY
Glucose-Capillary: 121 mg/dL — ABNORMAL HIGH (ref 70–99)
Glucose-Capillary: 98 mg/dL (ref 70–99)
Glucose-Capillary: 126 mg/dL — ABNORMAL HIGH (ref 70–99)
Glucose-Capillary: 134 mg/dL — ABNORMAL HIGH (ref 70–99)
Glucose-Capillary: 119 mg/dL — ABNORMAL HIGH (ref 70–99)
Glucose-Capillary: 114 mg/dL — ABNORMAL HIGH (ref 70–99)

## 2019-12-06 LAB — HEMOGLOBIN AND HEMATOCRIT, BLOOD
HCT: 32.2 % — ABNORMAL LOW (ref 39.0–52.0)
Hemoglobin: 8.9 g/dL — ABNORMAL LOW (ref 13.0–17.0)

## 2019-12-06 LAB — HIV ANTIBODY (ROUTINE TESTING W REFLEX): HIV Screen 4th Generation wRfx: NONREACTIVE

## 2019-12-06 LAB — RENAL FUNCTION PANEL
Albumin: 2.1 g/dL — ABNORMAL LOW (ref 3.5–5.0)
Anion gap: 9 (ref 5–15)
BUN: 61 mg/dL — ABNORMAL HIGH (ref 6–20)
CO2: 24 mmol/L (ref 22–32)
Calcium: 8.4 mg/dL — ABNORMAL LOW (ref 8.9–10.3)
Chloride: 106 mmol/L (ref 98–111)
Creatinine, Ser: 3.01 mg/dL — ABNORMAL HIGH (ref 0.61–1.24)
GFR calc Af Amer: 26 mL/min — ABNORMAL LOW
GFR calc non Af Amer: 22 mL/min — ABNORMAL LOW
Glucose, Bld: 113 mg/dL — ABNORMAL HIGH (ref 70–99)
Phosphorus: 4.7 mg/dL — ABNORMAL HIGH (ref 2.5–4.6)
Potassium: 5.4 mmol/L — ABNORMAL HIGH (ref 3.5–5.1)
Sodium: 139 mmol/L (ref 135–145)

## 2019-12-06 LAB — MAGNESIUM: Magnesium: 1.9 mg/dL (ref 1.7–2.4)

## 2019-12-06 LAB — ABO/RH: ABO/RH(D): A POS

## 2019-12-06 MED ORDER — METOPROLOL TARTRATE 5 MG/5ML IV SOLN
INTRAVENOUS | Status: AC
  Filled 2019-12-06: qty 5

## 2019-12-06 MED ORDER — SODIUM CHLORIDE 0.9% IV SOLUTION: Freq: Once | INTRAVENOUS | Status: DC

## 2019-12-06 MED ORDER — ADENOSINE 6 MG/2ML IV SOLN
INTRAVENOUS | Status: AC
  Filled 2019-12-06: qty 2

## 2019-12-06 MED ORDER — ADENOSINE 6 MG/2ML IV SOLN
INTRAVENOUS | Status: AC
  Filled 2019-12-06: qty 4

## 2019-12-06 MED ORDER — METOPROLOL TARTRATE 5 MG/5ML IV SOLN: 5.0000 mg | INTRAVENOUS | Status: DC | PRN

## 2019-12-06 MED ORDER — ADENOSINE 6 MG/2ML IV SOLN
INTRAVENOUS | Status: AC
  Administered 2019-12-06: 12 mg
  Filled 2019-12-06: qty 2

## 2019-12-06 MED ORDER — SODIUM CHLORIDE 0.9 % IV SOLN
100.0000 mg | Freq: Two times a day (BID) | INTRAVENOUS | Status: DC
  Administered 2019-12-06 – 2019-12-08 (×5): 100 mg via INTRAVENOUS
  Filled 2019-12-06 (×7): qty 100

## 2019-12-06 MED ORDER — METOPROLOL TARTRATE 5 MG/5ML IV SOLN
5.0000 mg | Freq: Four times a day (QID) | INTRAVENOUS | Status: DC
  Administered 2019-12-06 – 2019-12-07 (×4): 5 mg via INTRAVENOUS
  Filled 2019-12-06 (×4): qty 5

## 2019-12-06 MED ORDER — ADENOSINE 6 MG/2ML IV SOLN
INTRAVENOUS | Status: AC
  Administered 2019-12-06: 6 mg
  Filled 2019-12-06: qty 4

## 2019-12-06 MED ORDER — SODIUM CHLORIDE 0.9 % IV SOLN
2.0000 g | Freq: Two times a day (BID) | INTRAVENOUS | Status: DC
  Administered 2019-12-06 – 2019-12-08 (×5): 2 g via INTRAVENOUS
  Filled 2019-12-06 (×5): qty 2

## 2019-12-06 MED ORDER — ADENOSINE 6 MG/2ML IV SOLN
12.0000 mg | Freq: Once | INTRAVENOUS | Status: AC
  Administered 2019-12-06: 12 mg via INTRAVENOUS

## 2019-12-06 MED ORDER — ADENOSINE 6 MG/2ML IV SOLN: 6.0000 mg | Freq: Once | INTRAVENOUS | Status: AC

## 2019-12-06 MED ORDER — SODIUM ZIRCONIUM CYCLOSILICATE 10 G PO PACK
10.0000 g | PACK | Freq: Every day | ORAL | Status: DC
  Administered 2019-12-06 – 2019-12-08 (×3): 10 g via ORAL
  Filled 2019-12-06 (×4): qty 1

## 2019-12-06 MED ORDER — FUROSEMIDE 10 MG/ML IJ SOLN
20.0000 mg | Freq: Once | INTRAMUSCULAR | Status: AC
  Administered 2019-12-06: 20 mg via INTRAVENOUS
  Filled 2019-12-06: qty 2

## 2019-12-06 NOTE — Progress Notes (Signed)
Pharmacy Antibiotic Note  [name] is a 59 y.o. male admitted on 12/17/2019 with wound infection.  Pharmacy has been consulted for cefepime dosing.  Plan: cefepime 2gm iv q12h   Height: 5\' 11"  (180.3 cm) Weight: (!) 280.3 kg (618 lb) IBW/kg (Calculated) : 75.3  Temp (24hrs), Avg:97.8 F (36.6 C), Min:97.4 F (36.3 C), Max:98.2 F (36.8 C)  Recent Labs  Lab 12/03/2019 1500 11/22/2019 1623 11/26/2019 2150 12/05/19 0426 12/05/19 1006  WBC 23.2*  --   --  16.5*  --   CREATININE 3.30*  --  3.32* 3.39* 3.54*  LATICACIDVEN 1.3 1.1  --   --   --     Estimated Creatinine Clearance: 51.8 mL/min (A) (by C-G formula based on SCr of 3.54 mg/dL (H)).    No Known Allergies  Antimicrobials this admission: 4/17 cefepime >>  4/15 flagyl >>  4/15 vancomycin x 1   Microbiology results: 4/15 BCx: NGTD 4/15 UCx: multiple species present suggestion of recollection  4/15 Sputum: negative  4/15 MRSA PCR: Positive  Thank you for allowing pharmacy to be a part of this patient's care.  Donna Christen Dorsie Sethi 12/06/2019 7:34 AM

## 2019-12-06 NOTE — Progress Notes (Signed)
PROGRESS NOTE   [name]  VOH:[id number] DOB: 08/24/1960 DOA: 12/15/2019 PCP: Dione Housekeeper, MD   Chief Complaint  Patient presents with  . Shortness of Breath    Brief Narrative:  59 y.o. 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, following with wound clinic, patient presents to ED secondary to his weakness and shortness of breath, patient reports he had a fall a week ago when he was trying to get in his car, patient reports usually ambulates with a walker, for short distances, but he has not been able to ambulate since that fall, he has been bedbound since, he lives with his nephew and older brother, patient was able to walk and to drive up until a about a month ago,, patient has chronic lower extremity wound, where he has been following with wound clinic, he was on 10 days of Keflex which she finished couple days ago, patient chronic abdominal wounds has resolved, and he reports his chronic lower extremity wound has been improving, he denies any chest pain, fever, chills, nausea or vomiting, no coffee-ground emesis or melena.  Assessment & Plan:   Active Problems:   CKD (chronic kidney disease) stage 3, GFR 30-59 ml/min   DM type 2 with diabetic dyslipidemia ([place])   Hypertension associated with diabetes ([place])   Hypothyroidism   Iron deficiency anemia   Morbid obesity with BMI of 70 and over, adult ([place])   Obstructive sleep apnea syndrome   Infected wound   Non-healing wound of right lower extremity   Open wound of left thigh   Paroxysmal SVT (supraventricular tachycardia) (HCC)   AKI (acute kidney injury) (HCC)   Hyperkalemia   1. Paroxysmal SVT - Pt was given adenosine 6mg , 12mg , 12 mg, 12 mg with no improvement, given lopressor 5 mg IV x 1 and 15 mins later  converted to sinus rhythm.  Obtained serial EKGs on patient during episode and I called and discussed with cardiologist on call. CP symptoms resolved after SVT converted. She recommended for prevention to have him on scheduled beta blocker which is now ordered.  Follow telemetry. She says they would expect a mild bump in troponin from the heart strain from the SVT episode.  He feels better now and symptoms have fully resolved.  Lopressor 5 mg IV q6 h ordered.  2. Mild superficial soft tissue infection of right leg wound - continue IV antibiotics, added MRSA and pseudomonas coverage, continue wound care.  Surgery has seen.  No need for surgical debridement at this time.  Will need close outpatient follow up at wound care center.  3. AKI on CKD stage IV - he has hyperkalemia which is being addressed.  Nephrology has seen as well. Renal US ordered.  4. Hyperkalemia - gave kayexalate and calcium gluconate and IV lasix.   He is on scheduled lokelma 19 g TID per nephrology.  With episode of SVT this morning I want to really try to get this down today.  5. Acute on chronic hypoxic respiratory failure - Improving with diuresis.  6. Type 2 DM - resumed home lantus and added some prandial coverage and CBG testing.  7. Anemia of CKD - Hg down to 6.5.  I have ordered 2 units PRBC.  No active bleeding seen.  8. Essential hypertension - BPs have been soft, temporarily  holding home meds. Make sure appropriate sized cuff is used in BP measurements.  9. OSA - CPAP ordered.  10. Hypothyroidism - levothyroxine ordered.  11. Hyperlipidemia - resume home statin.  12. Gait instability - get PT eval when medically stabilized.   DVT prophylaxis:  Heparin  Code Status: Full  Family Communication: patient updated regarding careplan at bedside, verbalizes understanding  Disposition:   Status is: Inpatient  Remains inpatient appropriate because:Hemodynamically unstable  Dispo: The patient is from: Home               Anticipated d/c is to: Home              Anticipated d/c date is: 2 days              Patient currently is medically stable to d/c.   Consultants:   Surgery   Procedures:     Antimicrobials:    Subjective: Pt developed rapid SVT HR 160s with chest discomfort, palpitations    Objective: Vitals:   12/06/19 1100 12/06/19 1200 12/06/19 1300 12/06/19 1400  BP: (!) 128/56 (!) 109/38 (!) 120/56 (!) 119/53  Pulse: 88 79 82 74  Resp:      Temp:      TempSrc:      SpO2: 95% 94% 95% 94%  Weight:      Height:        Intake/Output Summary (Last 24 hours) at 12/06/2019 1442 Last data filed at 12/06/2019 1300 Gross per 24 hour  Intake 1721.69 ml  Output 3900 ml  Net -2178.31 ml   Filed Weights   12/17/2019 1411 12/05/19 0015 12/06/19 0500  Weight: (!) 272.2 kg (!) 280.3 kg (!) 280.3 kg    Examination:  General exam: morbidly obese male, lying in bariatric bed, Appears calm and comfortable  Respiratory system: distant sounds, no rales or crackles. Respiratory effort normal. Cardiovascular system: normal S1 & S2 heard. 2+ pedal edema. Gastrointestinal system: Abdomen is morbidly obese, soft and nontender. No organomegaly or masses felt. Normal bowel sounds heard. Central nervous system: Alert and oriented. No focal neurological deficits. Extremities: open lesions left leg pannicular fold there is an ulceration does not appear to have surrounding infection, open lesion right leg seems to be slowly healing, foul odor noted.  Dressings clean, no drainage.  Skin: chronic changes from morbid obesity.  Psychiatry: Judgement and insight appear normal. Mood & affect appropriate.   Data Reviewed: I have personally reviewed following labs and imaging studies  CBC: Recent Labs  Lab 12/16/2019 1500 12/05/19 0426 12/06/19 0623  WBC 23.2* 16.5* 9.9  NEUTROABS 18.7*  --  6.4  HGB 7.5* 7.4* 6.9*  HCT 26.4* 27.6* 25.1*  MCV 94.6 101.5* 96.9  PLT 357 322 124   Basic Metabolic  Panel: Recent Labs  Lab 11/25/2019 1500 12/06/2019 2150 12/05/19 0426 12/05/19 1006 12/06/19 0623  NA 134* 135 136 138 139  K 6.3* 5.8* 6.7* 5.8* 5.4*  CL 103 104 106 105 106  CO2 21* 23 22 24 24   GLUCOSE 203* 160* 156* 150* 113*  BUN 59* 57* 58* 59* 61*  CREATININE 3.30* 3.32* 3.39* 3.54* 3.01*  CALCIUM 8.4* 8.4* 8.4* 8.6* 8.4*  MG  --   --   --   --  1.9  PHOS  --   --   --  5.2* 4.7*    GFR: Estimated Creatinine Clearance: 61 mL/min (A) (by C-G formula based on SCr of 3.01 mg/dL (H)).  Liver Function Tests: Recent  Labs  Lab 11/20/2019 1500 12/05/19 1006 12/06/19 0623  AST 19  --   --   ALT 12  --   --   ALKPHOS 61  --   --   BILITOT 0.9  --   --   PROT 6.6  --   --   ALBUMIN 2.5* 2.3* 2.1*    CBG: Recent Labs  Lab 12/05/19 2051 12/06/19 0230 12/06/19 0747 12/06/19 0926 12/06/19 1206  GLUCAP 152* 121* 98 126* 134*     Recent Results (from the past 240 hour(s))  Blood culture (routine x 2)     Status: None (Preliminary result)   Collection Time: 11/24/2019  3:00 PM   Specimen: Right Antecubital; Blood  Result Value Ref Range Status   Specimen Description RIGHT ANTECUBITAL  Final   Special Requests   Final    BOTTLES DRAWN AEROBIC AND ANAEROBIC Blood Culture adequate volume   Culture   Final    NO GROWTH 2 DAYS Performed at [facility], [address], [place], [place] [address]    Report Status PENDING  Incomplete  SARS CORONAVIRUS 2 (TAT 6-24 HRS) Nasopharyngeal Nasopharyngeal Swab     Status: None   Collection Time: 11/28/2019  3:53 PM   Specimen: Nasopharyngeal Swab  Result Value Ref Range Status   SARS Coronavirus 2 NEGATIVE NEGATIVE Final    Comment: (NOTE) SARS-CoV-2 target nucleic acids are NOT DETECTED. The SARS-CoV-2 RNA is generally detectable in upper and lower respiratory specimens during the acute phase of infection. Negative results do not preclude SARS-CoV-2 infection, do not rule out co-infections with other pathogens, and should not be  used as the sole basis for treatment or other patient management decisions. Negative results must be combined with clinical observations, patient history, and epidemiological information. The expected result is Negative. Fact Sheet for Patients: SugarRoll.be Fact Sheet for Healthcare Providers: https://www.woods-mathews.com/ This test is not yet approved or cleared by the [place] FDA and  has been authorized for detection and/or diagnosis of SARS-CoV-2 by FDA under an Emergency Use Authorization (EUA). This EUA will remain  in effect (meaning this test can be used) for the duration of the COVID-19 declaration under Section 56 4(b)(1) of the Act, 21 U.S.C. section 360bbb-3(b)(1), unless the authorization is terminated or revoked sooner. Performed at [facility], [place] [address], [place], [place] [address]   Urine culture     Status: Abnormal   Collection Time: 12/19/2019  4:08 PM   Specimen: In/Out Cath Urine  Result Value Ref Range Status   Specimen Description   Final    IN/OUT CATH URINE Performed at [facility], [address] [place], [place] [address]    Special Requests   Final    NONE Performed at [facility], [address] [place], [place] [address]    Culture MULTIPLE SPECIES PRESENT, SUGGEST RECOLLECTION (A)  Final   Report Status 12/05/2019 FINAL  Final  Blood culture (routine x 2)     Status: None (Preliminary result)   Collection Time: 11/23/2019  4:18 PM   Specimen: BLOOD RIGHT WRIST  Result Value Ref Range Status   Specimen Description BLOOD RIGHT WRIST  Final   Special Requests   Final    BOTTLES DRAWN AEROBIC AND ANAEROBIC Blood Culture adequate volume   Culture   Final    NO GROWTH 2 DAYS Performed at [facility], [address], [place], [place] [address]    Report Status PENDING  Incomplete  Respiratory Panel  by RT PCR (Flu A&B, Covid) - Nasopharyngeal Swab     Status: None   Collection Time: 12/13/2019   7:19 PM   Specimen: Nasopharyngeal Swab  Result Value Ref Range Status   SARS Coronavirus 2 by RT PCR NEGATIVE NEGATIVE Final    Comment: (NOTE) SARS-CoV-2 target nucleic acids are NOT DETECTED. The SARS-CoV-2 RNA is generally detectable in upper respiratoy specimens during the acute phase of infection. The lowest concentration of SARS-CoV-2 viral copies this assay can detect is 131 copies/mL. A negative result does not preclude SARS-Cov-2 infection and should not be used as the sole basis for treatment or other patient management decisions. A negative result may occur with  improper specimen collection/handling, submission of specimen other than nasopharyngeal swab, presence of viral mutation(s) within the areas targeted by this assay, and inadequate number of viral copies (<131 copies/mL). A negative result must be combined with clinical observations, patient history, and epidemiological information. The expected result is Negative. Fact Sheet for Patients:  PinkCheek.be Fact Sheet for Healthcare Providers:  GravelBags.it This test is not yet ap proved or cleared by the [place] FDA and  has been authorized for detection and/or diagnosis of SARS-CoV-2 by FDA under an Emergency Use Authorization (EUA). This EUA will remain  in effect (meaning this test can be used) for the duration of the COVID-19 declaration under Section 564(b)(1) of the Act, 21 U.S.C. section 360bbb-3(b)(1), unless the authorization is terminated or revoked sooner.    Influenza A by PCR NEGATIVE NEGATIVE Final   Influenza B by PCR NEGATIVE NEGATIVE Final    Comment: (NOTE) The Xpert Xpress SARS-CoV-2/FLU/RSV assay is intended as an aid in  the diagnosis of influenza from Nasopharyngeal swab specimens and  should not be used as a sole basis for treatment. Nasal washings and  aspirates are unacceptable for Xpert Xpress SARS-CoV-2/FLU/RSV   testing. Fact Sheet for Patients: PinkCheek.be Fact Sheet for Healthcare Providers: GravelBags.it This test is not yet approved or cleared by the [place] FDA and  has been authorized for detection and/or diagnosis of SARS-CoV-2 by  FDA under an Emergency Use Authorization (EUA). This EUA will remain  in effect (meaning this test can be used) for the duration of the  Covid-19 declaration under Section 564(b)(1) of the Act, 21  U.S.C. section 360bbb-3(b)(1), unless the authorization is  terminated or revoked. Performed at [facility], [address], [place], [place] [address]   MRSA PCR Screening     Status: Abnormal   Collection Time: 11/26/2019 11:53 PM   Specimen: Nasopharyngeal  Result Value Ref Range Status   MRSA by PCR POSITIVE (A) NEGATIVE Final    Comment:        The GeneXpert MRSA Assay (FDA approved for NASAL specimens only), is one component of a comprehensive MRSA colonization surveillance program. It is not intended to diagnose MRSA infection nor to guide or monitor treatment for MRSA infections. RESULT CALLED TO, READ BACK BY AND VERIFIED WITH: FOLEY,B AT 7017 ON 12/05/19 BY HUFFINES,S Performed at [facility], [address] [place], [place] [address]     Radiology Studies: DG Tibia/Fibula Right  Result Date: 11/30/2019 CLINICAL DATA:  Fall last week with persistent right lower leg pain. EXAM: RIGHT TIBIA AND FIBULA - 2 VIEW COMPARISON:  Right knee 04/13/2010 FINDINGS: Moderate osteoarthritic change of the medial and lateral compartments of the knee. No acute fracture or dislocation. Linear metallic artifact over the AP view along the lateral aspect of the distal fibula. No  acute fracture or dislocation. Subtle calcifications over the soft tissues of the lower leg possibly venous. IMPRESSION: No acute findings. Moderate osteoarthritis of the right knee. Electronically Signed   By: Marin Olp M.D.    On: 11/21/2019 15:43   US Renal  Result Date: 12/05/2019 CLINICAL DATA:  Morbid obesity.  Chronic kidney disease. EXAM: RENAL / URINARY TRACT ULTRASOUND COMPLETE COMPARISON:  12/24/2014 FINDINGS: Right Kidney: Renal measurements: 11.8 x 6.4 x 7.8 cm = volume: 313 mL . Echogenicity within normal limits. No solid mass or hydronephrosis visualized. 4 x 3 x 4 cm anechoic right renal mass with increased through transmission consistent with a cyst. Left Kidney: Renal measurements: 13.4 x 6.5 x 6.5 cm = volume: 303 mL. Echogenicity within normal limits. No mass or hydronephrosis visualized. Bladder: Not visualized Other: None. IMPRESSION: 1. No obstructive uropathy. 2. Right renal cyst. Electronically Signed   By: Kathreen Devoid   On: 12/05/2019 15:24   DG Chest Port 1 View  Result Date: 12/09/2019 CLINICAL DATA:  Fall last week.  Shortness of breath. EXAM: PORTABLE CHEST 1 VIEW COMPARISON:  None. FINDINGS: Lungs are adequately inflated with mild hazy opacification over the medial right mid to lower lung suggesting atelectasis and possible small amount layering pleural fluid. Minimal hazy prominence of the perihilar vessels. Moderate cardiomegaly. Remainder of the exam is unremarkable. IMPRESSION: Moderate cardiomegaly with suggestion mild vascular congestion. Hazy density over the medial right mid to lower lung likely atelectasis with small amount of layering pleural fluid. Electronically Signed   By: Marin Olp M.D.   On: 12/11/2019 15:36   DG Shoulder Right Portable  Result Date: 12/03/2019 CLINICAL DATA:  Fall last week with persistent right shoulder pain. EXAM: PORTABLE RIGHT SHOULDER COMPARISON:  None. FINDINGS: Mild degenerative change of the [facility] joint. No acute fracture or dislocation. IMPRESSION: No acute findings. Electronically Signed   By: Marin Olp M.D.   On: 12/01/2019 15:37   Scheduled Meds: . sodium chloride   Intravenous Once  . atorvastatin  10 mg Oral q1800  . buPROPion  300 mg Oral  Daily  . Chlorhexidine Gluconate Cloth  6 each Topical Daily  . docusate sodium  100 mg Oral BID  . feeding supplement (PRO-STAT SUGAR FREE 64)  30 mL Oral TID  . fenofibrate  160 mg Oral Daily  . ferrous sulfate  325 mg Oral BID WC  . gabapentin  300 mg Oral TID  . heparin  5,000 Units Subcutaneous Q8H  . insulin aspart  0-20 Units Subcutaneous TID WC  . insulin aspart  0-5 Units Subcutaneous QHS  . insulin aspart  6 Units Subcutaneous TID WC  . insulin glargine  70 Units Subcutaneous Q2200  . levothyroxine  200 mcg Oral Daily  . metoprolol tartrate  5 mg Intravenous Q6H  . multivitamin-lutein  1 capsule Oral Daily  . mupirocin ointment   Nasal BID  . nutrition supplement (JUVEN)  1 packet Oral BID BM  . pantoprazole  40 mg Oral Daily  . Ensure Max Protein  11 oz Oral BID  . sodium zirconium cyclosilicate  10 g Oral TID  . torsemide  20 mg Oral Daily   Continuous Infusions: . sodium chloride 10 mL/hr at 12/05/19 1516  . ceFEPime (MAXIPIME) IV 2 g (12/06/19 0903)  . doxycycline (VIBRAMYCIN) IV 100 mg (12/06/19 1117)    LOS: 2 days   Critical Care Procedure Note Authorized and Performed by: Murvin Natal MD  Total Critical Care time:  33  mins Due to a high probability of clinically significant, life threatening deterioration, the patient required my highest level of preparedness to intervene emergently and I personally spent this critical care time directly and personally managing the patient.  This critical care time included obtaining a history; examining the patient, pulse oximetry; ordering and review of studies; arranging urgent treatment with development of a management plan; evaluation of patient's response of treatment; frequent reassessment; and discussions with other providers.  This critical care time was performed to assess and manage the high probability of imminent and life threatening deterioration that could result in multi-organ failure.  It was exclusive of separately  billable procedures and treating other patients and teaching time.    Irwin Brakeman, MD [facility]  To contact the attending provider between 7A-7P or the covering provider during after hours 7P-7A, please log into the web site [email or web address] and access using universal [place] password for that web site. If you do not have the password, please call the hospital operator.  12/06/2019, 2:42 PM

## 2019-12-06 NOTE — Progress Notes (Signed)
Sent Dr. Wynetta Emery critical Hemoglobin of 6.9 and patient now is having SVT heart rate of 152

## 2019-12-06 NOTE — Progress Notes (Signed)
PROGRESS NOTE   [name]  TOI:[id number] DOB: 13-Mar-1961 DOA: 12/05/2019 PCP: Dione Housekeeper, MD   Chief Complaint  Patient presents with  . Shortness of Breath    Brief Narrative:  59 y.o. 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, following with wound clinic, patient presents to ED secondary to his weakness and shortness of breath, patient reports he had a fall a week ago when he was trying to get in his car, patient reports usually ambulates with a walker, for short distances, but he has not been able to ambulate since that fall, he has been bedbound since, he lives with his nephew and older brother, patient was able to walk and to drive up until a about a month ago,, patient has chronic lower extremity wound, where he has been following with wound clinic, he was on 10 days of Keflex which she finished couple days ago, patient chronic abdominal wounds has resolved, and he reports his chronic lower extremity wound has been improving, he denies any chest pain, fever, chills, nausea or vomiting, no coffee-ground emesis or melena.  Assessment & Plan:   Active Problems:   CKD (chronic kidney disease) stage 3, GFR 30-59 ml/min   DM type 2 with diabetic dyslipidemia ([place])   Hypertension associated with diabetes ([place])   Hypothyroidism   Iron deficiency anemia   Morbid obesity with BMI of 70 and over, adult ([place])   Obstructive sleep apnea syndrome   Infected wound   Non-healing wound of right lower extremity   Open wound of left thigh   Paroxysmal SVT (supraventricular tachycardia) ([place])   1. Mild superficial soft tissue infection of right leg wound - continue IV antibiotics, added MRSA and pseudomonas coverage, continue wound care.  Surgery has seen.  No need for surgical  debridement at this time.  Will need close outpatient follow up at wound care center.  2. AKI on CKD stage IV - he has hyperkalemia which is being addressed.  Nephrology has seen as well. Renal US ordered.  3. Hyperkalemia - gave kayexalate and calcium gluconate and IV lasix.  4. Acute on chronic hypoxic respiratory failure - Improving with diuresis.  5. Type 2 DM - resumed home lantus and added some prandial coverage and CBG testing.  6. Anemia of CKD - following.  No active bleeding seen.  7. Essential hypertension - BPs have been soft, temporarily holding home meds. Make sure appropriate sized cuff is used in BP measurements.  8. OSA - CPAP ordered.  9. Hypothyroidism - levothyroxine ordered.  10. Hyperlipidemia - resume home statin.  11. Gait instability - get PT eval when medically stabilized.   DVT prophylaxis:  Heparin  Code Status: Full  Family Communication: patient updated regarding careplan at bedside, verbalizes understanding  Disposition:   Status is: Inpatient  Remains inpatient appropriate because:Hemodynamically unstable  Dispo: The patient is from: Home              Anticipated d/c is to: Home              Anticipated d/c date is: 2 days              Patient currently is medically stable to d/c.   Consultants:   Surgery   Procedures:     Antimicrobials:    Subjective:  Pt reports no complaints today.    Objective: Vitals:   12/06/19 1100 12/06/19 1200 12/06/19 1300 12/06/19 1400  BP: (!) 128/56 (!) 109/38 (!) 120/56 (!) 119/53  Pulse: 88 79 82 74  Resp:      Temp:      TempSrc:      SpO2: 95% 94% 95% 94%  Weight:      Height:        Intake/Output Summary (Last 24 hours) at 12/06/2019 1426 Last data filed at 12/06/2019 1300 Gross per 24 hour  Intake 1721.69 ml  Output 3100 ml  Net -1378.31 ml   Filed Weights   12/06/2019 1411 12/05/19 0015 12/06/19 0500  Weight: (!) 272.2 kg (!) 280.3 kg (!) 280.3 kg    Examination:  General exam: morbidly  obese male, lying in bariatric bed, Appears calm and comfortable  Respiratory system: distant sounds, no rales or crackles. Respiratory effort normal. Cardiovascular system: normal S1 & S2 heard. 2+ pedal edema. Gastrointestinal system: Abdomen is morbidly obese, soft and nontender. No organomegaly or masses felt. Normal bowel sounds heard. Central nervous system: Alert and oriented. No focal neurological deficits. Extremities: open lesions left leg pannicular fold there is an ulceration does not appear to have surrounding infection, open lesion right leg seems to be slowly healing, foul odor noted.  Dressings clean, no drainage.  Skin: chronic changes from morbid obesity.  Psychiatry: Judgement and insight appear normal. Mood & affect appropriate.   Data Reviewed: I have personally reviewed following labs and imaging studies  CBC: Recent Labs  Lab 12/09/2019 1500 12/05/19 0426 12/06/19 0623  WBC 23.2* 16.5* 9.9  NEUTROABS 18.7*  --  6.4  HGB 7.5* 7.4* 6.9*  HCT 26.4* 27.6* 25.1*  MCV 94.6 101.5* 96.9  PLT 357 322 974   Basic Metabolic Panel: Recent Labs  Lab 12/09/2019 1500 12/11/2019 2150 12/05/19 0426 12/05/19 1006 12/06/19 0623  NA 134* 135 136 138 139  K 6.3* 5.8* 6.7* 5.8* 5.4*  CL 103 104 106 105 106  CO2 21* 23 22 24 24   GLUCOSE 203* 160* 156* 150* 113*  BUN 59* 57* 58* 59* 61*  CREATININE 3.30* 3.32* 3.39* 3.54* 3.01*  CALCIUM 8.4* 8.4* 8.4* 8.6* 8.4*  MG  --   --   --   --  1.9  PHOS  --   --   --  5.2* 4.7*    GFR: Estimated Creatinine Clearance: 61 mL/min (A) (by C-G formula based on SCr of 3.01 mg/dL (H)).  Liver Function Tests: Recent Labs  Lab 11/25/2019 1500 12/05/19 1006 12/06/19 0623  AST 19  --   --   ALT 12  --   --   ALKPHOS 61  --   --   BILITOT 0.9  --   --   PROT 6.6  --   --   ALBUMIN 2.5* 2.3* 2.1*    CBG: Recent Labs  Lab 12/05/19 2051 12/06/19 0230 12/06/19 0747 12/06/19 0926 12/06/19 1206  GLUCAP 152* 121* 98 126* 134*      Recent Results (from the past 240 hour(s))  Blood culture (routine x 2)     Status: None (Preliminary result)   Collection Time: 11/27/2019  3:00 PM   Specimen: Right Antecubital; Blood  Result Value Ref Range Status   Specimen Description RIGHT ANTECUBITAL  Final   Special Requests   Final    BOTTLES DRAWN AEROBIC AND ANAEROBIC Blood Culture adequate volume   Culture   Final  NO GROWTH 2 DAYS Performed at [facility], [address], [place], [place] [address]    Report Status PENDING  Incomplete  SARS CORONAVIRUS 2 (TAT 6-24 HRS) Nasopharyngeal Nasopharyngeal Swab     Status: None   Collection Time: 12/15/2019  3:53 PM   Specimen: Nasopharyngeal Swab  Result Value Ref Range Status   SARS Coronavirus 2 NEGATIVE NEGATIVE Final    Comment: (NOTE) SARS-CoV-2 target nucleic acids are NOT DETECTED. The SARS-CoV-2 RNA is generally detectable in upper and lower respiratory specimens during the acute phase of infection. Negative results do not preclude SARS-CoV-2 infection, do not rule out co-infections with other pathogens, and should not be used as the sole basis for treatment or other patient management decisions. Negative results must be combined with clinical observations, patient history, and epidemiological information. The expected result is Negative. Fact Sheet for Patients: SugarRoll.be Fact Sheet for Healthcare Providers: https://www.woods-mathews.com/ This test is not yet approved or cleared by the [place] FDA and  has been authorized for detection and/or diagnosis of SARS-CoV-2 by FDA under an Emergency Use Authorization (EUA). This EUA will remain  in effect (meaning this test can be used) for the duration of the COVID-19 declaration under Section 56 4(b)(1) of the Act, 21 U.S.C. section 360bbb-3(b)(1), unless the authorization is terminated or revoked sooner. Performed at [facility], [place] [address]  [place], [place] [address]   Urine culture     Status: Abnormal   Collection Time: 12/10/2019  4:08 PM   Specimen: In/Out Cath Urine  Result Value Ref Range Status   Specimen Description   Final    IN/OUT CATH URINE Performed at [facility], [address], [place], [place] [address]    Special Requests   Final    NONE Performed at [facility], [address] [place], [place], [place] [address]    Culture MULTIPLE SPECIES PRESENT, SUGGEST RECOLLECTION (A)  Final   Report Status 12/05/2019 FINAL  Final  Blood culture (routine x 2)     Status: None (Preliminary result)   Collection Time: 12/10/2019  4:18 PM   Specimen: BLOOD RIGHT WRIST  Result Value Ref Range Status   Specimen Description BLOOD RIGHT WRIST  Final   Special Requests   Final    BOTTLES DRAWN AEROBIC AND ANAEROBIC Blood Culture adequate volume   Culture   Final    NO GROWTH 2 DAYS Performed at [facility], [address], [place], [place] [address]    Report Status PENDING  Incomplete  Respiratory Panel by RT PCR (Flu A&B, Covid) - Nasopharyngeal Swab     Status: None   Collection Time: 11/29/2019  7:19 PM   Specimen: Nasopharyngeal Swab  Result Value Ref Range Status   SARS Coronavirus 2 by RT PCR NEGATIVE NEGATIVE Final    Comment: (NOTE) SARS-CoV-2 target nucleic acids are NOT DETECTED. The SARS-CoV-2 RNA is generally detectable in upper respiratoy specimens during the acute phase of infection. The lowest concentration of SARS-CoV-2 viral copies this assay can detect is 131 copies/mL. A negative result does not preclude SARS-Cov-2 infection and should not be used as the sole basis for treatment or other patient management decisions. A negative result may occur with  improper specimen collection/handling, submission of specimen other than nasopharyngeal swab, presence of viral mutation(s) within the areas targeted by this assay, and inadequate number of viral copies (<131 copies/mL). A negative result must be combined with  clinical observations, patient history, and epidemiological information. The expected result  is Negative. Fact Sheet for Patients:  PinkCheek.be Fact Sheet for Healthcare Providers:  GravelBags.it This test is not yet ap proved or cleared by the [place] FDA and  has been authorized for detection and/or diagnosis of SARS-CoV-2 by FDA under an Emergency Use Authorization (EUA). This EUA will remain  in effect (meaning this test can be used) for the duration of the COVID-19 declaration under Section 564(b)(1) of the Act, 21 U.S.C. section 360bbb-3(b)(1), unless the authorization is terminated or revoked sooner.    Influenza A by PCR NEGATIVE NEGATIVE Final   Influenza B by PCR NEGATIVE NEGATIVE Final    Comment: (NOTE) The Xpert Xpress SARS-CoV-2/FLU/RSV assay is intended as an aid in  the diagnosis of influenza from Nasopharyngeal swab specimens and  should not be used as a sole basis for treatment. Nasal washings and  aspirates are unacceptable for Xpert Xpress SARS-CoV-2/FLU/RSV  testing. Fact Sheet for Patients: PinkCheek.be Fact Sheet for Healthcare Providers: GravelBags.it This test is not yet approved or cleared by the [place] FDA and  has been authorized for detection and/or diagnosis of SARS-CoV-2 by  FDA under an Emergency Use Authorization (EUA). This EUA will remain  in effect (meaning this test can be used) for the duration of the  Covid-19 declaration under Section 564(b)(1) of the Act, 21  U.S.C. section 360bbb-3(b)(1), unless the authorization is  terminated or revoked. Performed at [facility], [address] [place], [place] [address]   MRSA PCR Screening     Status: Abnormal   Collection Time: 12/17/2019 11:53 PM   Specimen: Nasopharyngeal  Result Value Ref Range Status   MRSA by PCR POSITIVE (A) NEGATIVE Final    Comment:        The  GeneXpert MRSA Assay (FDA approved for NASAL specimens only), is one component of a comprehensive MRSA colonization surveillance program. It is not intended to diagnose MRSA infection nor to guide or monitor treatment for MRSA infections. RESULT CALLED TO, READ BACK BY AND VERIFIED WITH: FOLEY,B AT 2952 ON 12/05/19 BY HUFFINES,S Performed at [facility], [address], [place], [place] [address]     Radiology Studies: DG Tibia/Fibula Right  Result Date: 11/29/2019 CLINICAL DATA:  Fall last week with persistent right lower leg pain. EXAM: RIGHT TIBIA AND FIBULA - 2 VIEW COMPARISON:  Right knee 04/13/2010 FINDINGS: Moderate osteoarthritic change of the medial and lateral compartments of the knee. No acute fracture or dislocation. Linear metallic artifact over the AP view along the lateral aspect of the distal fibula. No acute fracture or dislocation. Subtle calcifications over the soft tissues of the lower leg possibly venous. IMPRESSION: No acute findings. Moderate osteoarthritis of the right knee. Electronically Signed   By: Marin Olp M.D.   On: 11/28/2019 15:43   US Renal  Result Date: 12/05/2019 CLINICAL DATA:  Morbid obesity.  Chronic kidney disease. EXAM: RENAL / URINARY TRACT ULTRASOUND COMPLETE COMPARISON:  12/24/2014 FINDINGS: Right Kidney: Renal measurements: 11.8 x 6.4 x 7.8 cm = volume: 313 mL . Echogenicity within normal limits. No solid mass or hydronephrosis visualized. 4 x 3 x 4 cm anechoic right renal mass with increased through transmission consistent with a cyst. Left Kidney: Renal measurements: 13.4 x 6.5 x 6.5 cm = volume: 303 mL. Echogenicity within normal limits. No mass or hydronephrosis visualized. Bladder: Not visualized Other: None. IMPRESSION: 1. No obstructive uropathy. 2. Right renal cyst. Electronically Signed   By: Kathreen Devoid   On: 12/05/2019 15:24   DG Chest  Port 1 View  Result Date: 11/25/2019 CLINICAL DATA:  Fall last week.  Shortness of breath. EXAM:  PORTABLE CHEST 1 VIEW COMPARISON:  None. FINDINGS: Lungs are adequately inflated with mild hazy opacification over the medial right mid to lower lung suggesting atelectasis and possible small amount layering pleural fluid. Minimal hazy prominence of the perihilar vessels. Moderate cardiomegaly. Remainder of the exam is unremarkable. IMPRESSION: Moderate cardiomegaly with suggestion mild vascular congestion. Hazy density over the medial right mid to lower lung likely atelectasis with small amount of layering pleural fluid. Electronically Signed   By: Marin Olp M.D.   On: 11/25/2019 15:36   DG Shoulder Right Portable  Result Date: 12/02/2019 CLINICAL DATA:  Fall last week with persistent right shoulder pain. EXAM: PORTABLE RIGHT SHOULDER COMPARISON:  None. FINDINGS: Mild degenerative change of the [facility] joint. No acute fracture or dislocation. IMPRESSION: No acute findings. Electronically Signed   By: Marin Olp M.D.   On: 12/10/2019 15:37   Scheduled Meds: . sodium chloride   Intravenous Once  . atorvastatin  10 mg Oral q1800  . buPROPion  300 mg Oral Daily  . Chlorhexidine Gluconate Cloth  6 each Topical Daily  . docusate sodium  100 mg Oral BID  . feeding supplement (PRO-STAT SUGAR FREE 64)  30 mL Oral TID  . fenofibrate  160 mg Oral Daily  . ferrous sulfate  325 mg Oral BID WC  . gabapentin  300 mg Oral TID  . heparin  5,000 Units Subcutaneous Q8H  . insulin aspart  0-20 Units Subcutaneous TID WC  . insulin aspart  0-5 Units Subcutaneous QHS  . insulin aspart  6 Units Subcutaneous TID WC  . insulin glargine  70 Units Subcutaneous Q2200  . levothyroxine  200 mcg Oral Daily  . metoprolol tartrate  5 mg Intravenous Q6H  . multivitamin-lutein  1 capsule Oral Daily  . mupirocin ointment   Nasal BID  . nutrition supplement (JUVEN)  1 packet Oral BID BM  . pantoprazole  40 mg Oral Daily  . Ensure Max Protein  11 oz Oral BID  . sodium zirconium cyclosilicate  10 g Oral TID  . torsemide  20  mg Oral Daily   Continuous Infusions: . sodium chloride 10 mL/hr at 12/05/19 1516  . ceFEPime (MAXIPIME) IV 2 g (12/06/19 0903)  . doxycycline (VIBRAMYCIN) IV 100 mg (12/06/19 1117)    LOS: 2 days   Critical Care Procedure Note Authorized and Performed by: Murvin Natal MD  Total Critical Care time:  31 mins Due to a high probability of clinically significant, life threatening deterioration, the patient required my highest level of preparedness to intervene emergently and I personally spent this critical care time directly and personally managing the patient.  This critical care time included obtaining a history; examining the patient, pulse oximetry; ordering and review of studies; arranging urgent treatment with development of a management plan; evaluation of patient's response of treatment; frequent reassessment; and discussions with other providers.  This critical care time was performed to assess and manage the high probability of imminent and life threatening deterioration that could result in multi-organ failure.  It was exclusive of separately billable procedures and treating other patients and teaching time.    Irwin Brakeman, MD [facility]  To contact the attending provider between 7A-7P or the covering provider during after hours 7P-7A, please log into the web site [email or web address] and access using universal [place] password for that web site. If you do not  have the password, please call the hospital operator.  12/06/2019, 2:26 PM

## 2019-12-06 NOTE — Progress Notes (Signed)
Patient ID: [name], male   DOB: June 13, 1961, 59 y.o.   MRN: [id number] S: Had an episode of SVT this am and noted to have drop in Hgb to 6.8.  Feeling better.  Diuresed 3 liters over the last 24 hours.  No other complaints at this time.  O:BP (!) 119/53   Pulse 74   Temp 98.6 F (37 C) (Axillary)   Resp 18   Ht 5\' 11"  (1.803 m)   Wt (!) 280.3 kg   SpO2 94%   BMI 86.19 kg/m   Intake/Output Summary (Last 24 hours) at 12/06/2019 1439 Last data filed at 12/06/2019 1300 Gross per 24 hour  Intake 1721.69 ml  Output 3900 ml  Net -2178.31 ml   Intake/Output: I/O last 3 completed shifts: In: 3183.6 [P.O.:1020; I.V.:60.9; IV Piggyback:2102.6] Out: 3200 [Urine:3200]  Intake/Output this shift:  Total I/O In: 1220.8 [P.O.:960; I.V.:160.8; IV Piggyback:100] Out: 1600 [Urine:1600] Weight change: 8.165 kg Gen: morbidly obese WM lying in bed on Bipap, resting comfortably CVS: RRR, no rub Resp: diminished BS Abd: obese, + suprapubic edema Ext: 2+ lower extremity edema and presacral area.  Left leg with moisture wicking material in fold.  Recent Labs  Lab 12/12/2019 1500 12/05/2019 2150 12/05/19 0426 12/05/19 1006 12/06/19 0623  NA 134* 135 136 138 139  K 6.3* 5.8* 6.7* 5.8* 5.4*  CL 103 104 106 105 106  CO2 21* 23 22 24 24   GLUCOSE 203* 160* 156* 150* 113*  BUN 59* 57* 58* 59* 61*  CREATININE 3.30* 3.32* 3.39* 3.54* 3.01*  ALBUMIN 2.5*  --   --  2.3* 2.1*  CALCIUM 8.4* 8.4* 8.4* 8.6* 8.4*  PHOS  --   --   --  5.2* 4.7*  AST 19  --   --   --   --   ALT 12  --   --   --   --    Liver Function Tests: Recent Labs  Lab 11/29/2019 1500 12/05/19 1006 12/06/19 0623  AST 19  --   --   ALT 12  --   --   ALKPHOS 61  --   --   BILITOT 0.9  --   --   PROT 6.6  --   --   ALBUMIN 2.5* 2.3* 2.1*   No results for input(s): LIPASE, AMYLASE in the last 168 hours. No results for input(s): AMMONIA in the last 168 hours. CBC: Recent Labs  Lab 12/13/2019 1500 12/05/19 0426  12/06/19 0623  WBC 23.2* 16.5* 9.9  NEUTROABS 18.7*  --  6.4  HGB 7.5* 7.4* 6.9*  HCT 26.4* 27.6* 25.1*  MCV 94.6 101.5* 96.9  PLT 357 322 333   Cardiac Enzymes: Recent Labs  Lab 12/05/19 0426  CKTOTAL 55   CBG: Recent Labs  Lab 12/05/19 2051 12/06/19 0230 12/06/19 0747 12/06/19 0926 12/06/19 1206  GLUCAP 152* 121* 98 126* 134*    Iron Studies:  Recent Labs    12/16/2019 1623  IRON 15*  TIBC 450  FERRITIN 40   Studies/Results: DG Tibia/Fibula Right  Result Date: 12/09/2019 CLINICAL DATA:  Fall last week with persistent right lower leg pain. EXAM: RIGHT TIBIA AND FIBULA - 2 VIEW COMPARISON:  Right knee 04/13/2010 FINDINGS: Moderate osteoarthritic change of the medial and lateral compartments of the knee. No acute fracture or dislocation. Linear metallic artifact over the AP view along the lateral aspect of the distal fibula. No acute fracture or dislocation. Subtle calcifications over the soft tissues of the  lower leg possibly venous. IMPRESSION: No acute findings. Moderate osteoarthritis of the right knee. Electronically Signed   By: Marin Olp M.D.   On: 12/09/2019 15:43   US Renal  Result Date: 12/05/2019 CLINICAL DATA:  Morbid obesity.  Chronic kidney disease. EXAM: RENAL / URINARY TRACT ULTRASOUND COMPLETE COMPARISON:  12/24/2014 FINDINGS: Right Kidney: Renal measurements: 11.8 x 6.4 x 7.8 cm = volume: 313 mL . Echogenicity within normal limits. No solid mass or hydronephrosis visualized. 4 x 3 x 4 cm anechoic right renal mass with increased through transmission consistent with a cyst. Left Kidney: Renal measurements: 13.4 x 6.5 x 6.5 cm = volume: 303 mL. Echogenicity within normal limits. No mass or hydronephrosis visualized. Bladder: Not visualized Other: None. IMPRESSION: 1. No obstructive uropathy. 2. Right renal cyst. Electronically Signed   By: Kathreen Devoid   On: 12/05/2019 15:24   DG Chest Port 1 View  Result Date: 12/17/2019 CLINICAL DATA:  Fall last week.   Shortness of breath. EXAM: PORTABLE CHEST 1 VIEW COMPARISON:  None. FINDINGS: Lungs are adequately inflated with mild hazy opacification over the medial right mid to lower lung suggesting atelectasis and possible small amount layering pleural fluid. Minimal hazy prominence of the perihilar vessels. Moderate cardiomegaly. Remainder of the exam is unremarkable. IMPRESSION: Moderate cardiomegaly with suggestion mild vascular congestion. Hazy density over the medial right mid to lower lung likely atelectasis with small amount of layering pleural fluid. Electronically Signed   By: Marin Olp M.D.   On: 12/17/2019 15:36   DG Shoulder Right Portable  Result Date: 12/17/2019 CLINICAL DATA:  Fall last week with persistent right shoulder pain. EXAM: PORTABLE RIGHT SHOULDER COMPARISON:  None. FINDINGS: Mild degenerative change of the [facility] joint. No acute fracture or dislocation. IMPRESSION: No acute findings. Electronically Signed   By: Marin Olp M.D.   On: 12/19/2019 15:37   . sodium chloride   Intravenous Once  . atorvastatin  10 mg Oral q1800  . buPROPion  300 mg Oral Daily  . Chlorhexidine Gluconate Cloth  6 each Topical Daily  . docusate sodium  100 mg Oral BID  . feeding supplement (PRO-STAT SUGAR FREE 64)  30 mL Oral TID  . fenofibrate  160 mg Oral Daily  . ferrous sulfate  325 mg Oral BID WC  . gabapentin  300 mg Oral TID  . heparin  5,000 Units Subcutaneous Q8H  . insulin aspart  0-20 Units Subcutaneous TID WC  . insulin aspart  0-5 Units Subcutaneous QHS  . insulin aspart  6 Units Subcutaneous TID WC  . insulin glargine  70 Units Subcutaneous Q2200  . levothyroxine  200 mcg Oral Daily  . metoprolol tartrate  5 mg Intravenous Q6H  . multivitamin-lutein  1 capsule Oral Daily  . mupirocin ointment   Nasal BID  . nutrition supplement (JUVEN)  1 packet Oral BID BM  . pantoprazole  40 mg Oral Daily  . Ensure Max Protein  11 oz Oral BID  . sodium zirconium cyclosilicate  10 g Oral TID  .  torsemide  20 mg Oral Daily    BMET    Component Value Date/Time   NA 139 12/06/2019 0623   K 5.4 (H) 12/06/2019 0623   CL 106 12/06/2019 0623   CO2 24 12/06/2019 0623   GLUCOSE 113 (H) 12/06/2019 0623   BUN 61 (H) 12/06/2019 0623   CREATININE 3.01 (H) 12/06/2019 0623   CALCIUM 8.4 (L) 12/06/2019 0623   GFRNONAA 22 (L) 12/06/2019 7989  GFRAA 26 (L) 12/06/2019 0623   CBC    Component Value Date/Time   WBC 9.9 12/06/2019 0623   RBC 2.59 (L) 12/06/2019 0623   HGB 6.9 (LL) 12/06/2019 0623   HCT 25.1 (L) 12/06/2019 0623   PLT 333 12/06/2019 0623   MCV 96.9 12/06/2019 0623   MCH 26.6 12/06/2019 0623   MCHC 27.5 (L) 12/06/2019 0623   RDW 16.5 (H) 12/06/2019 0623   LYMPHSABS 1.3 12/06/2019 0623   MONOABS 1.3 (H) 12/06/2019 0623   EOSABS 0.7 (H) 12/06/2019 0623   BASOSABS 0.1 12/06/2019 0623     Assessment/Plan:  1. Non-oliguric AKI/CKD stage 3b (baseline Scr was 2-2.1 in November 2020) in setting of septic shock, diuretics, anemia, and concomitant ARB therapy.   Scr has been improving after stopping losartan despite vigorous diuresis. 2. Septic shock- due to cellulitis/panniculitis of lower extremity ulcers.  On broad spectrum antibiotics per primary svc 3. Hyperkalemia- improving with diuresis and lokelma.  Continue to follow.  4. CKD stage 3b presumably due to combination of DM, HTN, and diastolic CHF (grade I diastolic dysfunction by ECHO 05/12/10 but not repeated since).  Normally follows [employer]. 5. Acute on chronic anemia of CKD stage 3b- agree with blood transfusion.  Will check iron stores and will benefit from ESA therapy as well.  6. SVT- likely due to infection and anemia.  Rate improved.  7. Pulmonary edema- h/o diastolic CHF.   1. Recommend repeat ECHO as one has not been done in 10 years.  He may now have combined systolic and diastolic CHF. 2. Brisk diuresis after 30 mg IV lasix yesterday.  Agree with decreasing to 20 mg today. 8. HTN-  off meds for now. 9. OSA/OHS- on Cpap 10. Super morbid obesity 11. DM- per primary   Will review labs remotely and see him again on Monday 12/17/19.  Please call with questions or concerns.   Donetta Potts, MD [employer] [phone]

## 2019-12-07 LAB — TYPE AND SCREEN
ABO/RH(D): A POS
Antibody Screen: NEGATIVE
Unit division: 0
Unit division: 0

## 2019-12-07 LAB — BPAM RBC
Blood Product Expiration Date: 202105082359
Blood Product Expiration Date: 202105082359
ISSUE DATE / TIME: 202104171441
ISSUE DATE / TIME: 202104171732
Unit Type and Rh: 6200
Unit Type and Rh: 6200

## 2019-12-07 LAB — GLUCOSE, CAPILLARY
Glucose-Capillary: 114 mg/dL — ABNORMAL HIGH (ref 70–99)
Glucose-Capillary: 99 mg/dL (ref 70–99)
Glucose-Capillary: 119 mg/dL — ABNORMAL HIGH (ref 70–99)
Glucose-Capillary: 123 mg/dL — ABNORMAL HIGH (ref 70–99)
Glucose-Capillary: 110 mg/dL — ABNORMAL HIGH (ref 70–99)

## 2019-12-07 LAB — CBC
HCT: 31.8 % — ABNORMAL LOW (ref 39.0–52.0)
Hemoglobin: 8.9 g/dL — ABNORMAL LOW (ref 13.0–17.0)
MCH: 26.6 pg (ref 26.0–34.0)
MCHC: 28 g/dL — ABNORMAL LOW (ref 30.0–36.0)
MCV: 95.2 fL (ref 80.0–100.0)
Platelets: 350 10*3/uL (ref 150–400)
RBC: 3.34 MIL/uL — ABNORMAL LOW (ref 4.22–5.81)
RDW: 16.5 % — ABNORMAL HIGH (ref 11.5–15.5)
WBC: 11.6 10*3/uL — ABNORMAL HIGH (ref 4.0–10.5)
nRBC: 0 % (ref 0.0–0.2)

## 2019-12-07 LAB — RENAL FUNCTION PANEL
Albumin: 2.2 g/dL — ABNORMAL LOW (ref 3.5–5.0)
Anion gap: 9 (ref 5–15)
BUN: 62 mg/dL — ABNORMAL HIGH (ref 6–20)
CO2: 27 mmol/L (ref 22–32)
Calcium: 8.9 mg/dL (ref 8.9–10.3)
Chloride: 104 mmol/L (ref 98–111)
Creatinine, Ser: 2.48 mg/dL — ABNORMAL HIGH (ref 0.61–1.24)
GFR calc Af Amer: 32 mL/min — ABNORMAL LOW (ref >60)
GFR calc non Af Amer: 28 mL/min — ABNORMAL LOW (ref >60)
Glucose, Bld: 112 mg/dL — ABNORMAL HIGH (ref 70–99)
Phosphorus: 4.2 mg/dL (ref 2.5–4.6)
Potassium: 5 mmol/L (ref 3.5–5.1)
Sodium: 140 mmol/L (ref 135–145)

## 2019-12-07 LAB — MAGNESIUM: Magnesium: 1.8 mg/dL (ref 1.7–2.4)

## 2019-12-07 MED ORDER — METOPROLOL TARTRATE 25 MG PO TABS
25.0000 mg | ORAL_TABLET | Freq: Two times a day (BID) | ORAL | Status: DC
  Administered 2019-12-07 – 2019-12-08 (×3): 25 mg via ORAL
  Filled 2019-12-07 (×3): qty 1

## 2019-12-07 MED ORDER — MUSCLE RUB 10-15 % EX CREA
TOPICAL_CREAM | CUTANEOUS | Status: DC | PRN
  Filled 2019-12-07: qty 85

## 2019-12-07 MED ORDER — OXYCODONE HCL 5 MG PO TABS
5.0000 mg | ORAL_TABLET | Freq: Four times a day (QID) | ORAL | Status: DC | PRN
  Administered 2019-12-07 – 2019-12-08 (×3): 10 mg via ORAL
  Filled 2019-12-07 (×3): qty 2

## 2019-12-07 NOTE — Progress Notes (Signed)
PROGRESS NOTE   [name]  PYK:[id number] DOB: 1961-04-04 DOA: 11/28/2019 PCP: Dione Housekeeper, MD   Chief Complaint  Patient presents with  . Shortness of Breath    Brief Narrative:  59 y.o. 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, following with wound clinic, patient presents to ED secondary to his weakness and shortness of breath, patient reports he had a fall a week ago when he was trying to get in his car, patient reports usually ambulates with a walker, for short distances, but he has not been able to ambulate since that fall, he has been bedbound since, he lives with his nephew and older brother, patient was able to walk and to drive up until a about a month ago,, patient has chronic lower extremity wound, where he has been following with wound clinic, he was on 10 days of Keflex which she finished couple days ago, patient chronic abdominal wounds has resolved, and he reports his chronic lower extremity wound has been improving, he denies any chest pain, fever, chills, nausea or vomiting, no coffee-ground emesis or melena.  Assessment & Plan:   Active Problems:   CKD (chronic kidney disease) stage 3, GFR 30-59 ml/min   DM type 2 with diabetic dyslipidemia ([place])   Hypertension associated with diabetes ([place])   Hypothyroidism   Iron deficiency anemia   Morbid obesity with BMI of 70 and over, adult ([place])   Obstructive sleep apnea syndrome   Infected wound   Non-healing wound of right lower extremity   Open wound of left thigh   Paroxysmal SVT (supraventricular tachycardia) (H[facility])   AKI (acute kidney injury) (H[facility])   Hyperkalemia   1. Paroxysmal SVT - Pt developed acute pSVT on 4/17, he was given adenosine 6mg , 12mg , 12 mg, 12 mg with no improvement, given lopressor 5 mg  IV x 1 and 15 mins later converted to sinus rhythm.  Obtained serial EKGs on patient during episode and I called and discussed with cardiologist on call. CP symptoms resolved after SVT converted. She recommended for prevention to have him on scheduled beta blocker which is now ordered.  Follow telemetry. She says they would expect a mild bump in troponin from the heart strain from the SVT episode.  He feels better now and symptoms have fully resolved.  Lopressor 5 mg IV q6 h ordered and now on oral metoprolol.   2. Mild superficial soft tissue infection of right leg wound - continue IV antibiotics, added MRSA and pseudomonas coverage, continue wound care.  Surgery has seen.  No need for surgical debridement at this time.  Will need close outpatient follow up at wound care center.  3. Leukocytosis - WBC trending down with treatment.  4. AKI on CKD stage IV - he has hyperkalemia which is being addressed.  Nephrology has seen as well. Renal US ordered.  5. Hyperkalemia - gave kayexalate and calcium gluconate and IV lasix.   He is on scheduled lokelma 19 g TID per nephrology.  With episode of SVT this morning I want to really try to get this down today.  6. Acute on chronic hypoxic respiratory failure - Improving with diuresis.  7. Type 2 DM - resumed home lantus and added some prandial coverage and CBG testing.  8. Anemia of CKD - Hg down to 6.5.  S/p 2 units PRBC.  Hg up to 8 now.  No active bleeding seen.  9. Essential hypertension - BPs have been soft, temporarily holding home meds. Make sure appropriate sized cuff is used in BP measurements.  10. OSA - CPAP ordered.  11. Hypothyroidism - levothyroxine ordered.  12. Hyperlipidemia - resume home statin.  13. Gait instability - awaiting PT eval, says he needs knee injections to ambulate hasn't had in 3 months, was getting from [facility].  14. Chronic bilateral knee pain - Pt had been receiving monthly knee injections from Dr. Luna Glasgow last injection  was Feb 2021.  He now says he can't ambulate due to knee pain. Will see if ortho could see him in hospital for injection so we can get him ambulating again.   15. Right shoulder pain - No fracture seen on xray but suspect rotator cuff tear, pain management, will see if we can get an ortho consult.   DVT prophylaxis:  Heparin  Code Status: Full  Family Communication: patient updated regarding careplan at bedside, verbalizes understanding  Disposition:   Status is: Inpatient  Remains inpatient appropriate because:Ongoing active pain requiring inpatient pain management, Unsafe d/c plan and IV treatments appropriate due to intensity of illness or inability to take PO  Dispo: The patient is from: Home              Anticipated d/c is to: SNF              Anticipated d/c date is: 2 days              Patient currently is not medically stable to d/c.   Consultants:   Surgery   Procedures:     Antimicrobials:  Cefepime doxycycline  Subjective: Pt c/o right shoulder pain, knee pain, cannot ambulate  Objective: Vitals:   12/07/19 0600 12/07/19 0700 12/07/19 0744 12/07/19 0800  BP: 117/75 (!) 126/55  129/67  Pulse: 79 (!) 42  84  Resp: (!) 28 (!) 27  (!) 23  Temp:   98.1 F (36.7 C)   TempSrc:   Oral   SpO2: 99% 99%  97%  Weight:      Height:        Intake/Output Summary (Last 24 hours) at 12/07/2019 1040 Last data filed at 12/07/2019 1019 Gross per 24 hour  Intake 1890.8 ml  Output 4475 ml  Net -2584.2 ml   Filed Weights   12/05/19 0015 12/06/19 0500 12/07/19 0500  Weight: (!) 280.3 kg (!) 280.3 kg (!) 281.5 kg    Examination:  General exam: morbidly obese male, lying in bariatric bed, Appears calm and comfortable  Respiratory system: distant sounds, no rales or crackles. Respiratory effort normal. Cardiovascular system: normal S1 & S2 heard. 2+ pedal edema. Gastrointestinal system: Abdomen is morbidly obese, soft and nontender. No organomegaly or masses felt. Normal  bowel sounds heard. Central nervous system: Alert and oriented. No focal neurological deficits. Extremities: painful right shoulder joint, minimal active abduction but can passively abduct with pain, open lesions left leg pannicular fold there is an ulceration does not appear to have surrounding infection, open lesion right leg seems to be slowly healing, foul odor noted.  Dressings clean, no drainage.  Skin: chronic changes from morbid obesity.  Psychiatry: Judgement and insight appear normal. Mood & affect appropriate.   Data Reviewed: I have personally reviewed following labs and imaging studies  CBC: Recent Labs  Lab 11/24/2019 1500 12/05/19 0426 12/06/19 7829 12/06/19 2227 12/07/19 0751  WBC 23.2* 16.5* 9.9  --  11.6*  NEUTROABS 18.7*  --  6.4  --   --   HGB 7.5* 7.4* 6.9* 8.9* 8.9*  HCT 26.4* 27.6* 25.1* 32.2* 31.8*  MCV 94.6 101.5* 96.9  --  95.2  PLT 357 322 333  --  924   Basic Metabolic Panel: Recent Labs  Lab 12/16/2019 2150 12/05/19 0426 12/05/19 1006 12/06/19 0623 12/07/19 0751  NA 135 136 138 139 140  K 5.8* 6.7* 5.8* 5.4* 5.0  CL 104 106 105 106 104  CO2 23 22 24 24 27   GLUCOSE 160* 156* 150* 113* 112*  BUN 57* 58* 59* 61* 62*  CREATININE 3.32* 3.39* 3.54* 3.01* 2.48*  CALCIUM 8.4* 8.4* 8.6* 8.4* 8.9  MG  --   --   --  1.9 1.8  PHOS  --   --  5.2* 4.7* 4.2    GFR: Estimated Creatinine Clearance: 74.2 mL/min (A) (by C-G formula based on SCr of 2.48 mg/dL (H)).  Liver Function Tests: Recent Labs  Lab 12/03/2019 1500 12/05/19 1006 12/06/19 0623 12/07/19 0751  AST 19  --   --   --   ALT 12  --   --   --   ALKPHOS 61  --   --   --   BILITOT 0.9  --   --   --   PROT 6.6  --   --   --   ALBUMIN 2.5* 2.3* 2.1* 2.2*    CBG: Recent Labs  Lab 12/06/19 1206 12/06/19 1630 12/06/19 2120 12/07/19 0450 12/07/19 0743  GLUCAP 134* 119* 114* 114* 99     Recent Results (from the past 240 hour(s))  Blood culture (routine x 2)     Status: None (Preliminary  result)   Collection Time: 11/20/2019  3:00 PM   Specimen: Right Antecubital; Blood  Result Value Ref Range Status   Specimen Description RIGHT ANTECUBITAL  Final   Special Requests   Final    BOTTLES DRAWN AEROBIC AND ANAEROBIC Blood Culture adequate volume   Culture   Final    NO GROWTH 2 DAYS Performed at [facility], [address] [place], [place] [address]    Report Status PENDING  Incomplete  SARS CORONAVIRUS 2 (TAT 6-24 HRS) Nasopharyngeal Nasopharyngeal Swab     Status: None   Collection Time: 11/23/2019  3:53 PM   Specimen: Nasopharyngeal Swab  Result Value Ref Range Status   SARS Coronavirus 2 NEGATIVE NEGATIVE Final    Comment: (NOTE) SARS-CoV-2 target nucleic acids are NOT DETECTED. The SARS-CoV-2 RNA is generally detectable in upper and lower respiratory specimens during the acute phase of infection. Negative results do not preclude SARS-CoV-2 infection, do not rule out co-infections with other pathogens, and should not be used as the sole basis for treatment or other patient management decisions. Negative results must be combined with clinical observations, patient history, and epidemiological information. The expected result is Negative. Fact Sheet for Patients: SugarRoll.be Fact Sheet for Healthcare Providers: https://www.woods-mathews.com/ This test is not yet approved or cleared by the [place] FDA and  has been authorized for detection and/or diagnosis of SARS-CoV-2 by FDA under an Emergency Use Authorization (EUA). This EUA will remain  in effect (meaning this test can be used) for the duration of the COVID-19 declaration under Section 56 4(b)(1) of the Act, 21 U.S.C. section 360bbb-3(b)(1), unless the authorization is terminated or revoked sooner. Performed at [facility], [place] [address], [place], [place]  [address]   Urine culture     Status: Abnormal   Collection Time: 11/23/2019  4:08 PM   Specimen:  In/Out Cath Urine  Result Value Ref Range Status   Specimen Description   Final    IN/OUT CATH URINE Performed at [facility], [address], [place], [place] [address]    Special Requests   Final    NONE Performed at [facility], [address] [place], [place] [address]    Culture MULTIPLE SPECIES PRESENT, SUGGEST RECOLLECTION (A)  Final   Report Status 12/05/2019 FINAL  Final  Blood culture (routine x 2)     Status: None (Preliminary result)   Collection Time: 12/15/2019  4:18 PM   Specimen: BLOOD RIGHT WRIST  Result Value Ref Range Status   Specimen Description BLOOD RIGHT WRIST  Final   Special Requests   Final    BOTTLES DRAWN AEROBIC AND ANAEROBIC Blood Culture adequate volume   Culture   Final    NO GROWTH 2 DAYS Performed at [facility], [address], [place], [place] [address]    Report Status PENDING  Incomplete  Respiratory Panel by RT PCR (Flu A&B, Covid) - Nasopharyngeal Swab     Status: None   Collection Time: 12/17/2019  7:19 PM   Specimen: Nasopharyngeal Swab  Result Value Ref Range Status   SARS Coronavirus 2 by RT PCR NEGATIVE NEGATIVE Final    Comment: (NOTE) SARS-CoV-2 target nucleic acids are NOT DETECTED. The SARS-CoV-2 RNA is generally detectable in upper respiratoy specimens during the acute phase of infection. The lowest concentration of SARS-CoV-2 viral copies this assay can detect is 131 copies/mL. A negative result does not preclude SARS-Cov-2 infection and should not be used as the sole basis for treatment or other patient management decisions. A negative result may occur with  improper specimen collection/handling, submission of specimen other than nasopharyngeal swab, presence of viral mutation(s) within the areas targeted by this assay, and inadequate number of viral copies (<131 copies/mL). A negative result must be combined with clinical observations, patient history, and epidemiological information. The expected result is Negative. Fact  Sheet for Patients:  PinkCheek.be Fact Sheet for Healthcare Providers:  GravelBags.it This test is not yet ap proved or cleared by the [place] FDA and  has been authorized for detection and/or diagnosis of SARS-CoV-2 by FDA under an Emergency Use Authorization (EUA). This EUA will remain  in effect (meaning this test can be used) for the duration of the COVID-19 declaration under Section 564(b)(1) of the Act, 21 U.S.C. section 360bbb-3(b)(1), unless the authorization is terminated or revoked sooner.    Influenza A by PCR NEGATIVE NEGATIVE Final   Influenza B by PCR NEGATIVE NEGATIVE Final    Comment: (NOTE) The Xpert Xpress SARS-CoV-2/FLU/RSV assay is intended as an aid in  the diagnosis of influenza from Nasopharyngeal swab specimens and  should not be used as a sole basis for treatment. Nasal washings and  aspirates are unacceptable for Xpert Xpress SARS-CoV-2/FLU/RSV  testing. Fact Sheet for Patients: PinkCheek.be Fact Sheet for Healthcare Providers: GravelBags.it This test is not yet approved or cleared by the [place] FDA and  has been authorized for detection and/or diagnosis of SARS-CoV-2 by  FDA under an Emergency Use Authorization (EUA). This EUA will remain  in effect (meaning this test can be used) for the duration of the  Covid-19 declaration under Section 564(b)(1) of the Act, 21  U.S.C. section 360bbb-3(b)(1), unless the authorization is  terminated or revoked.  Performed at [facility], [address] [place], [place] [address]   MRSA PCR Screening     Status: Abnormal   Collection Time: 12/10/2019 11:53 PM   Specimen: Nasopharyngeal  Result Value Ref Range Status   MRSA by PCR POSITIVE (A) NEGATIVE Final    Comment:        The GeneXpert MRSA Assay (FDA approved for NASAL specimens only), is one component of a comprehensive MRSA  colonization surveillance program. It is not intended to diagnose MRSA infection nor to guide or monitor treatment for MRSA infections. RESULT CALLED TO, READ BACK BY AND VERIFIED WITH: FOLEY,B AT 1191 ON 12/05/19 BY HUFFINES,S Performed at [facility], [address] [place], [place] [address]     Radiology Studies: US Renal  Result Date: 12/05/2019 CLINICAL DATA:  Morbid obesity.  Chronic kidney disease. EXAM: RENAL / URINARY TRACT ULTRASOUND COMPLETE COMPARISON:  12/24/2014 FINDINGS: Right Kidney: Renal measurements: 11.8 x 6.4 x 7.8 cm = volume: 313 mL . Echogenicity within normal limits. No solid mass or hydronephrosis visualized. 4 x 3 x 4 cm anechoic right renal mass with increased through transmission consistent with a cyst. Left Kidney: Renal measurements: 13.4 x 6.5 x 6.5 cm = volume: 303 mL. Echogenicity within normal limits. No mass or hydronephrosis visualized. Bladder: Not visualized Other: None. IMPRESSION: 1. No obstructive uropathy. 2. Right renal cyst. Electronically Signed   By: Kathreen Devoid   On: 12/05/2019 15:24   Scheduled Meds: . sodium chloride   Intravenous Once  . atorvastatin  10 mg Oral q1800  . buPROPion  300 mg Oral Daily  . Chlorhexidine Gluconate Cloth  6 each Topical Daily  . docusate sodium  100 mg Oral BID  . feeding supplement (PRO-STAT SUGAR FREE 64)  30 mL Oral TID  . fenofibrate  160 mg Oral Daily  . ferrous sulfate  325 mg Oral BID WC  . gabapentin  300 mg Oral TID  . heparin  5,000 Units Subcutaneous Q8H  . insulin aspart  0-20 Units Subcutaneous TID WC  . insulin aspart  0-5 Units Subcutaneous QHS  . insulin aspart  6 Units Subcutaneous TID WC  . insulin glargine  70 Units Subcutaneous Q2200  . levothyroxine  200 mcg Oral Daily  . metoprolol tartrate  25 mg Oral BID  . multivitamin-lutein  1 capsule Oral Daily  . mupirocin ointment   Nasal BID  . nutrition supplement (JUVEN)  1 packet Oral BID BM  . pantoprazole  40 mg Oral Daily  .  Ensure Max Protein  11 oz Oral BID  . sodium zirconium cyclosilicate  10 g Oral Daily  . torsemide  20 mg Oral Daily   Continuous Infusions: . sodium chloride 250 mL (12/06/19 2001)  . ceFEPime (MAXIPIME) IV 2 g (12/07/19 0940)  . doxycycline (VIBRAMYCIN) IV 100 mg (12/06/19 2006)    LOS: 3 days   Critical Care Procedure Note Authorized and Performed by: Murvin Natal MD  Total Critical Care time:  32 mins Due to a high probability of clinically significant, life threatening deterioration, the patient required my highest level of preparedness to intervene emergently and I personally spent this critical care time directly and personally managing the patient.  This critical care time included obtaining a history; examining the patient, pulse oximetry; ordering and review of studies; arranging urgent treatment with development of a management plan; evaluation of patient's response of treatment; frequent reassessment; and discussions with other providers.  This critical care time was  performed to assess and manage the high probability of imminent and life threatening deterioration that could result in multi-organ failure.  It was exclusive of separately billable procedures and treating other patients and teaching time.    Irwin Brakeman, MD [facility]  To contact the attending provider between 7A-7P or the covering provider during after hours 7P-7A, please log into the web site [email or web address] and access using universal [place] password for that web site. If you do not have the password, please call the hospital operator.  12/07/2019, 10:40 AM

## 2019-12-08 DIAGNOSIS — D72829 Elevated white blood cell count, unspecified: Secondary | ICD-10-CM | POA: Diagnosis present

## 2019-12-08 DIAGNOSIS — I469 Cardiac arrest, cause unspecified: Secondary | ICD-10-CM | POA: Diagnosis present

## 2019-12-08 DIAGNOSIS — Z9981 Dependence on supplemental oxygen: Secondary | ICD-10-CM

## 2019-12-08 DIAGNOSIS — A419 Sepsis, unspecified organism: Secondary | ICD-10-CM | POA: Diagnosis present

## 2019-12-08 DIAGNOSIS — L0390 Cellulitis, unspecified: Secondary | ICD-10-CM | POA: Diagnosis present

## 2019-12-08 DIAGNOSIS — I471 Supraventricular tachycardia: Secondary | ICD-10-CM

## 2019-12-08 DIAGNOSIS — R739 Hyperglycemia, unspecified: Secondary | ICD-10-CM | POA: Diagnosis present

## 2019-12-08 DIAGNOSIS — R079 Chest pain, unspecified: Secondary | ICD-10-CM | POA: Clinically undetermined

## 2019-12-08 DIAGNOSIS — I472 Ventricular tachycardia, unspecified: Secondary | ICD-10-CM

## 2019-12-08 DIAGNOSIS — D631 Anemia in chronic kidney disease: Secondary | ICD-10-CM | POA: Diagnosis present

## 2019-12-08 LAB — CBC
HCT: 30.5 % — ABNORMAL LOW (ref 39.0–52.0)
Hemoglobin: 8.5 g/dL — ABNORMAL LOW (ref 13.0–17.0)
MCH: 26.7 pg (ref 26.0–34.0)
MCHC: 27.9 g/dL — ABNORMAL LOW (ref 30.0–36.0)
MCV: 95.9 fL (ref 80.0–100.0)
Platelets: 352 10*3/uL (ref 150–400)
RBC: 3.18 MIL/uL — ABNORMAL LOW (ref 4.22–5.81)
RDW: 16.4 % — ABNORMAL HIGH (ref 11.5–15.5)
WBC: 11.3 10*3/uL — ABNORMAL HIGH (ref 4.0–10.5)
nRBC: 0 % (ref 0.0–0.2)

## 2019-12-08 LAB — RENAL FUNCTION PANEL
Albumin: 2.1 g/dL — ABNORMAL LOW (ref 3.5–5.0)
Anion gap: 9 (ref 5–15)
BUN: 65 mg/dL — ABNORMAL HIGH (ref 6–20)
CO2: 29 mmol/L (ref 22–32)
Calcium: 9.2 mg/dL (ref 8.9–10.3)
Chloride: 105 mmol/L (ref 98–111)
Creatinine, Ser: 2.27 mg/dL — ABNORMAL HIGH (ref 0.61–1.24)
GFR calc Af Amer: 36 mL/min — ABNORMAL LOW (ref >60)
GFR calc non Af Amer: 31 mL/min — ABNORMAL LOW (ref >60)
Glucose, Bld: 85 mg/dL (ref 70–99)
Phosphorus: 3.7 mg/dL (ref 2.5–4.6)
Potassium: 5 mmol/L (ref 3.5–5.1)
Sodium: 143 mmol/L (ref 135–145)

## 2019-12-08 LAB — GLUCOSE, CAPILLARY
Glucose-Capillary: 115 mg/dL — ABNORMAL HIGH (ref 70–99)
Glucose-Capillary: 79 mg/dL (ref 70–99)
Glucose-Capillary: 92 mg/dL (ref 70–99)
Glucose-Capillary: 105 mg/dL — ABNORMAL HIGH (ref 70–99)
Glucose-Capillary: 171 mg/dL — ABNORMAL HIGH (ref 70–99)

## 2019-12-08 LAB — MAGNESIUM: Magnesium: 1.6 mg/dL — ABNORMAL LOW (ref 1.7–2.4)

## 2019-12-08 MED ORDER — METOPROLOL TARTRATE 50 MG PO TABS: 50.0000 mg | ORAL_TABLET | Freq: Two times a day (BID) | ORAL | Status: DC

## 2019-12-08 MED ORDER — AMIODARONE HCL IN DEXTROSE 360-4.14 MG/200ML-% IV SOLN
INTRAVENOUS | Status: AC
  Filled 2019-12-08: qty 200

## 2019-12-08 MED ORDER — LABETALOL HCL 5 MG/ML IV SOLN: 10.0000 mg | Freq: Once | INTRAVENOUS | Status: DC

## 2019-12-08 MED ORDER — AMIODARONE HCL IN DEXTROSE 360-4.14 MG/200ML-% IV SOLN: 30.0000 mg/h | INTRAVENOUS | Status: DC

## 2019-12-08 MED ORDER — NOREPINEPHRINE 4 MG/250ML-% IV SOLN
0.0000 ug/min | INTRAVENOUS | Status: DC
  Filled 2019-12-08: qty 250

## 2019-12-08 MED ORDER — DARBEPOETIN ALFA 25 MCG/0.42ML IJ SOSY
25.0000 ug | PREFILLED_SYRINGE | INTRAMUSCULAR | Status: DC
  Administered 2019-12-08: 25 ug via SUBCUTANEOUS
  Filled 2019-12-08: qty 0.42

## 2019-12-08 MED ORDER — MORPHINE SULFATE (PF) 4 MG/ML IV SOLN
INTRAVENOUS | Status: AC
  Administered 2019-12-08: 8 mg
  Filled 2019-12-08: qty 2

## 2019-12-08 MED ORDER — NYSTATIN 100000 UNIT/GM EX POWD: Freq: Three times a day (TID) | CUTANEOUS | Status: DC

## 2019-12-08 MED ORDER — AMIODARONE HCL IN DEXTROSE 360-4.14 MG/200ML-% IV SOLN
60.0000 mg/h | INTRAVENOUS | Status: DC
  Filled 2019-12-08: qty 200

## 2019-12-08 MED ORDER — MAGNESIUM SULFATE 4 GM/100ML IV SOLN: 4.0000 g | Freq: Once | INTRAVENOUS | Status: DC

## 2019-12-09 LAB — CULTURE, BLOOD (ROUTINE X 2)
Culture: NO GROWTH
Culture: NO GROWTH
Special Requests: ADEQUATE
Special Requests: ADEQUATE

## 2019-12-11 MED FILL — Medication: Qty: 1 | Status: AC

## 2019-12-20 NOTE — TOC Progression Note (Signed)
Transition of Care [facility]) - Progression Note    Patient Details  Name: TEVION LAFORGE MRN: [id number] Date of Birth: Jul 30, 1961  [facility]) CM/SW Contact  Boneta Lucks, RN Phone Number: Dec 23, 2019, 1:09 PM  Clinical Narrative:   Blake Divine advised that [place] does not have Bariatric beds.  She gave a list for TOC to call.  [employer] and [facility] has but does Leisure centre manager. It will be $1500 up front. TOC spoke with patient, he does not have money to pay up front. He does have a hospital bed, states it is wore out, 59 year old.    [employer] company will file insurance [facility] waiting for a call back to see if they have Bariatric beds. TOC to follow.    Expected Discharge Plan: [place] Barriers to Discharge: Continued Medical Work up  Expected Discharge Plan and Services Expected Discharge Plan: [place] arrangements for the past 2 months: Single Family Home                 DME Arranged: Hospital bed DME Agency: [employer] Date DME Agency Contacted: 12/05/19 Time DME Agency Contacted: [phone] Representative spoke with at DME Agency: Blake Divine

## 2019-12-20 NOTE — Plan of Care (Signed)
  Problem: Acute Rehab PT Goals(only PT should resolve) Goal: Pt Will Go Supine/Side To Sit Outcome: Progressing Flowsheets (Taken 13-Dec-2019 1100) Pt will go Supine/Side to Sit: with moderate assist Goal: Patient Will Transfer Sit To/From Stand Outcome: Progressing Flowsheets (Taken Dec 13, 2019 1100) Patient will transfer sit to/from stand: with moderate assist Goal: Pt Will Transfer Bed To Chair/Chair To Bed Outcome: Progressing Flowsheets (Taken 12/13/19 1100) Pt will Transfer Bed to Chair/Chair to Bed: with mod assist Goal: Pt Will Ambulate Outcome: Progressing Flowsheets (Taken 12/13/2019 1100) Pt will Ambulate:  10 feet  with moderate assist  with rolling walker   11:01 AM, December 13, 2019 Lonell Grandchild, MPT Physical Therapist with [facility] [phone] office [phone] mobile phone

## 2019-12-20 NOTE — Progress Notes (Addendum)
Patient Active Problem List   Diagnosis Date Noted  . Cardiopulmonary arrest ([place]) 12/17/19  . V-tach ([place]) 2019-12-17  . Paroxysmal SVT (supraventricular tachycardia) ([place]) 12/06/2019  . AKI (acute kidney injury) ([place])   . Hyperkalemia   . Non-healing wound of right lower extremity   . Open wound of left thigh   . Infected wound 12/06/2019  . Grade I hemorrhoids 10/15/2018  . Long-term current use of thyroid hormone replacement therapy 12/04/2017  . On statin therapy 12/04/2017  . Obstructive sleep apnea syndrome 06/19/2017  . Hypertension associated with diabetes ([place]) 02/22/2017  . Anxiety, generalized 04/10/2016  . Hypothyroidism 11/22/2015  . Iron deficiency anemia 11/22/2015  . Right knee pain 10/06/2015  . Infective panniculitis 08/24/2015  . Vitamin D deficiency 08/24/2015  . DM type 2 with diabetic dyslipidemia ([place]) 02/01/2015  . Gastroesophageal reflux disease without esophagitis 02/01/2015  . Pernicious anemia 02/01/2015  . Candidal intertrigo 08/26/2014  . CKD (chronic kidney disease) stage 3, GFR 30-59 ml/min 08/26/2014  . Long-term insulin use ([place]) 08/26/2014  . Binge eating 12/22/2013  . Morbid obesity with BMI of 70 and over, adult ([place]) 12/22/2013  . Need for immunization against influenza 09/09/2012  . Arthritis 04/11/2012      --I responded to [place] in room 302 -Patient found pulseless, without perfusing rhythm unresponsive without a perfusing rhythm -This was a witnessed arrest as patient's nephew was at bedside talking to him when patient suddenly went out just around 7 PM --BLS and ACLS protocol instituted -Patient had pulseless V. tach and received several shocks -Patient received several doses of epinephrine IV per ACLS protocol -Patient was noted to have a potassium of 5.0, and magnesium of 1.6 earlier this morning--- we gave iv bicarb, calcium and IV magnesium, amiodarone and Levophed drip --- IO was placed into the left tibia --- Dr.  Sabra Heck from ED attempted right IJ central line please see documentation from Dr. Sabra Heck for details -Dr. Sabra Heck from ED successfully intubated patient -Dr. Olevia Bowens nocturnist joined the code team around 7:35 PM -I spoke with patient's nephew who was present at bedside when patient had witnessed arrest, I also spoke to patient's sister Ms. Ronnald Collum----- ---after about 45 minutes of CPR and BLS and ACLS protocol --- Resuscitative efforts were discontinued due to lack of perfusion reading, patient remained without spontaneous respirations, pulseless, and unresponsive --- IV amiodarone and IV Levophed drips were discontinued  --Please see full code LOG/documentation with complete ACLS protocol and medications and shocks administered as well as the time of dose administrations -Time of death 7:45 PM  --Additional questions from patient's nephew and patient's sister answered  Roxan Hockey, MD

## 2019-12-20 NOTE — Death Summary Note (Signed)
DEATH SUMMARY   Patient Details  Name: [name] MRN: [id number] DOB: [birth year]  Admission/Discharge Information   Admit Date:  Dec 05, 2019  Date of Death: Date of Death: 12-09-19  Time of Death: Time of Death: 1945  Length of Stay: 4  Referring Physician: Dione Housekeeper, MD   Reason(s) for Hospitalization  [name]  is a 59 y.o. 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, following with wound clinic, patient presents to ED secondary to his weakness and shortness of breath, patient reports he had a fall a week ago when he was trying to get in his car, patient reports usually ambulates with a walker, for short distances, but he has not been able to ambulate since that fall, he has been bedbound since, he lives with his nephew and older brother, patient was able to walk and to drive up until a about a month ago,, patient has chronic lower extremity wound, where he has been following with wound clinic, he was on 10 days of Keflex which she finished couple days ago, patient chronic abdominal wounds has resolved, and he reports his chronic lower extremity wound has been improving, he denies any chest pain, fever, chills, nausea or vomiting, no coffee-ground emesis or melena. - in ED patient with was noted with increased oxygen requirement from 4 to 6 L, chest x-ray with some volume overload, initially hypotensive but responded to fluid bolus, hemoglobin 7.5, down from baseline 8-9, and WBC of 23K, normal UA, no infection on CXR, potassium of 6.3, received IV insulin and D50 , and lokelma, [facility] was consulted to admit.  Diagnoses  Preliminary cause of death:  Secondary Diagnoses (including complications and co-morbidities):  Principal Problem:   Cardiopulmonary arrest  [facility]) Active Problems:   Right knee pain   Arthritis   Binge eating   Candidal intertrigo   CKD (chronic kidney disease) stage 3, GFR 30-59 ml/min   DM type 2 with diabetic dyslipidemia (HCC)   Gastroesophageal reflux disease without esophagitis   Grade I hemorrhoids   Hypertension associated with diabetes (HCC)   Hypothyroidism   Infective panniculitis   Iron deficiency anemia   Long-term current use of thyroid hormone replacement therapy   Long-term insulin use (HCC)   Morbid obesity with BMI of 70 and over, adult ([place])   Obstructive sleep apnea syndrome   On statin therapy   Vitamin D deficiency   Infected wound   Non-healing wound of right lower extremity   Open wound of left thigh   Paroxysmal SVT (supraventricular tachycardia) (HCC)   AKI (acute kidney injury) (HCC)   Hyperkalemia   V-tach (HCC)   Chest pain   Sepsis ([place])   On home oxygen therapy   Cellulitis   Leukocytosis   Anemia in chronic kidney disease (CKD)   Hyperglycemia   Brief Hospital Course (including significant findings, care, treatment, and services provided and events leading to death)  [name] is a 59 y.o. year old male supermorbid obesity presented with multiple complaint including sepsis, inability to walk, chronic wound infections in both legs and multiple panniculitis infections. He presented with acute on chronic renal failure.  He was seen by nephrology. He had hyperkalemia that was treated and corrected.  He also had anemia and received 2 unit PRBC transfusion to improve Hg to  8.  He developed SVT on 4/17 s/p multiple doses of adenosine and eventually SVT resolved after lopressor given and was kept on lopressor. He remained stable to transfer to telemetry unit from stepdown ICU.  He worked with PT earlier today and after PT complained of pain in all joints and body. He was only able to move to sit up to side of bed. Later in the evening he developed chest pain symptoms and tachycardia and  atrial arrhythmias and shortly after became unresponsive as witnessed by a nephew that was visiting him. Code Blue was called and CPR was initiated and BLS / ACLS protocol initiated. He developed pulseless v tach with several shocks delivered, several doses of epinephrine given, IV bicarb and magnesium given, amiodarone and levophed given. There was no ROSC after 45 mins of high quality CPr, BLS and ACLS and time of death was pronounced at 7:45 pm.  Pt's nephew and sister notified.   Pertinent Labs and Studies  Significant Diagnostic Studies DG Tibia/Fibula Right  Result Date: 12/10/2019 CLINICAL DATA:  Fall last week with persistent right lower leg pain. EXAM: RIGHT TIBIA AND FIBULA - 2 VIEW COMPARISON:  Right knee 04/13/2010 FINDINGS: Moderate osteoarthritic change of the medial and lateral compartments of the knee. No acute fracture or dislocation. Linear metallic artifact over the AP view along the lateral aspect of the distal fibula. No acute fracture or dislocation. Subtle calcifications over the soft tissues of the lower leg possibly venous. IMPRESSION: No acute findings. Moderate osteoarthritis of the right knee. Electronically Signed   By: Marin Olp M.D.   On: 12/15/2019 15:43   US Renal  Result Date: 12/05/2019 CLINICAL DATA:  Morbid obesity.  Chronic kidney disease. EXAM: RENAL / URINARY TRACT ULTRASOUND COMPLETE COMPARISON:  12/24/2014 FINDINGS: Right Kidney: Renal measurements: 11.8 x 6.4 x 7.8 cm = volume: 313 mL . Echogenicity within normal limits. No solid mass or hydronephrosis visualized. 4 x 3 x 4 cm anechoic right renal mass with increased through transmission consistent with a cyst. Left Kidney: Renal measurements: 13.4 x 6.5 x 6.5 cm = volume: 303 mL. Echogenicity within normal limits. No mass or hydronephrosis visualized. Bladder: Not visualized Other: None. IMPRESSION: 1. No obstructive uropathy. 2. Right renal cyst. Electronically Signed   By: Kathreen Devoid   On: 12/05/2019  15:24   DG Chest Port 1 View  Result Date: 12/18/2019 CLINICAL DATA:  Fall last week.  Shortness of breath. EXAM: PORTABLE CHEST 1 VIEW COMPARISON:  None. FINDINGS: Lungs are adequately inflated with mild hazy opacification over the medial right mid to lower lung suggesting atelectasis and possible small amount layering pleural fluid. Minimal hazy prominence of the perihilar vessels. Moderate cardiomegaly. Remainder of the exam is unremarkable. IMPRESSION: Moderate cardiomegaly with suggestion mild vascular congestion. Hazy density over the medial right mid to lower lung likely atelectasis with small amount of layering pleural fluid. Electronically Signed   By: Marin Olp M.D.   On: 12/18/2019 15:36   DG Shoulder Right Portable  Result Date: 12/11/2019 CLINICAL DATA:  Fall last week with persistent right shoulder pain. EXAM: PORTABLE RIGHT SHOULDER COMPARISON:  None. FINDINGS: Mild degenerative change of the [facility] joint. No acute fracture or dislocation. IMPRESSION: No acute findings. Electronically Signed   By: Marin Olp M.D.   On: 12/02/2019 15:37    Microbiology Recent Results (from the past 240 hour(s))  Blood culture (routine x 2)     Status: None (Preliminary result)   Collection Time: 12/19/2019  3:00 PM   Specimen: Right Antecubital; Blood  Result Value Ref Range Status   Specimen Description RIGHT ANTECUBITAL  Final   Special Requests   Final    BOTTLES DRAWN AEROBIC AND ANAEROBIC Blood Culture adequate volume   Culture   Final    NO GROWTH 4 DAYS Performed at [facility], [address], [place], [place] [address]    Report Status PENDING  Incomplete  SARS CORONAVIRUS 2 (TAT 6-24 HRS) Nasopharyngeal Nasopharyngeal Swab     Status: None   Collection Time: 11/24/2019  3:53 PM   Specimen: Nasopharyngeal Swab  Result Value Ref Range Status   SARS Coronavirus 2 NEGATIVE NEGATIVE Final    Comment: (NOTE) SARS-CoV-2 target nucleic acids are NOT DETECTED. The SARS-CoV-2 RNA is  generally detectable in upper and lower respiratory specimens during the acute phase of infection. Negative results do not preclude SARS-CoV-2 infection, do not rule out co-infections with other pathogens, and should not be used as the sole basis for treatment or other patient management decisions. Negative results must be combined with clinical observations, patient history, and epidemiological information. The expected result is Negative. Fact Sheet for Patients: SugarRoll.be Fact Sheet for Healthcare Providers: https://www.woods-mathews.com/ This test is not yet approved or cleared by the [place] FDA and  has been authorized for detection and/or diagnosis of SARS-CoV-2 by FDA under an Emergency Use Authorization (EUA). This EUA will remain  in effect (meaning this test can be used) for the duration of the COVID-19 declaration under Section 56 4(b)(1) of the Act, 21 U.S.C. section 360bbb-3(b)(1), unless the authorization is terminated or revoked sooner. Performed at [facility], [place] [address], [place], [place] [address]   Urine culture     Status: Abnormal   Collection Time: 12/10/2019  4:08 PM   Specimen: In/Out Cath Urine  Result Value Ref Range Status   Specimen Description   Final    IN/OUT CATH URINE Performed at [facility], [address] [place], [place] [address]    Special Requests   Final    NONE Performed at [facility], [address] [place], [place] [address]    Culture MULTIPLE SPECIES PRESENT, SUGGEST RECOLLECTION (A)  Final   Report Status 12/05/2019 FINAL  Final  Blood culture (routine x 2)     Status: None (Preliminary result)   Collection Time: 11/29/2019  4:18 PM   Specimen: BLOOD RIGHT WRIST  Result Value Ref Range Status   Specimen Description BLOOD RIGHT WRIST  Final   Special Requests   Final    BOTTLES DRAWN AEROBIC AND ANAEROBIC Blood Culture adequate volume   Culture   Final    NO GROWTH 4  DAYS Performed at [facility], [address], [place], [place] [address]    Report Status PENDING  Incomplete  Respiratory Panel by RT PCR (Flu A&B, Covid) - Nasopharyngeal Swab     Status: None   Collection Time: 11/29/2019  7:19 PM   Specimen: Nasopharyngeal Swab  Result Value Ref Range Status   SARS Coronavirus 2 by RT PCR NEGATIVE NEGATIVE Final    Comment: (NOTE) SARS-CoV-2 target nucleic acids are NOT DETECTED. The SARS-CoV-2 RNA is generally detectable in upper respiratoy specimens during the acute phase of infection. The lowest concentration of SARS-CoV-2 viral copies this assay can detect is 131 copies/mL. A negative result does not preclude SARS-Cov-2 infection and should not be used as the sole basis for treatment or other patient management decisions. A negative result  may occur with  improper specimen collection/handling, submission of specimen other than nasopharyngeal swab, presence of viral mutation(s) within the areas targeted by this assay, and inadequate number of viral copies (<131 copies/mL). A negative result must be combined with clinical observations, patient history, and epidemiological information. The expected result is Negative. Fact Sheet for Patients:  PinkCheek.be Fact Sheet for Healthcare Providers:  GravelBags.it This test is not yet ap proved or cleared by the [place] FDA and  has been authorized for detection and/or diagnosis of SARS-CoV-2 by FDA under an Emergency Use Authorization (EUA). This EUA will remain  in effect (meaning this test can be used) for the duration of the COVID-19 declaration under Section 564(b)(1) of the Act, 21 U.S.C. section 360bbb-3(b)(1), unless the authorization is terminated or revoked sooner.    Influenza A by PCR NEGATIVE NEGATIVE Final   Influenza B by PCR NEGATIVE NEGATIVE Final    Comment: (NOTE) The Xpert Xpress SARS-CoV-2/FLU/RSV assay is intended  as an aid in  the diagnosis of influenza from Nasopharyngeal swab specimens and  should not be used as a sole basis for treatment. Nasal washings and  aspirates are unacceptable for Xpert Xpress SARS-CoV-2/FLU/RSV  testing. Fact Sheet for Patients: PinkCheek.be Fact Sheet for Healthcare Providers: GravelBags.it This test is not yet approved or cleared by the [place] FDA and  has been authorized for detection and/or diagnosis of SARS-CoV-2 by  FDA under an Emergency Use Authorization (EUA). This EUA will remain  in effect (meaning this test can be used) for the duration of the  Covid-19 declaration under Section 564(b)(1) of the Act, 21  U.S.C. section 360bbb-3(b)(1), unless the authorization is  terminated or revoked. Performed at [facility], [address] [place], [place] [address]   MRSA PCR Screening     Status: Abnormal   Collection Time: 12/17/2019 11:53 PM   Specimen: Nasopharyngeal  Result Value Ref Range Status   MRSA by PCR POSITIVE (A) NEGATIVE Final    Comment:        The GeneXpert MRSA Assay (FDA approved for NASAL specimens only), is one component of a comprehensive MRSA colonization surveillance program. It is not intended to diagnose MRSA infection nor to guide or monitor treatment for MRSA infections. RESULT CALLED TO, READ BACK BY AND VERIFIED WITH: FOLEY,B AT 1443 ON 12/05/19 BY HUFFINES,S Performed at [facility], [address], [place], [place] [address]     Lab Basic Metabolic Panel: Recent Labs  Lab 12/05/19 0426 12/05/19 1006 12/06/19 0623 12/07/19 0751 12/13/19 0525  NA 136 138 139 140 143  K 6.7* 5.8* 5.4* 5.0 5.0  CL 106 105 106 104 105  CO2 22 24 24 27 29   GLUCOSE 156* 150* 113* 112* 85  BUN 58* 59* 61* 62* 65*  CREATININE 3.39* 3.54* 3.01* 2.48* 2.27*  CALCIUM 8.4* 8.6* 8.4* 8.9 9.2  MG  --   --  1.9 1.8 1.6*  PHOS  --  5.2* 4.7* 4.2 3.7   Liver Function  Tests: Recent Labs  Lab 12/15/2019 1500 12/05/19 1006 12/06/19 0623 12/07/19 0751 Dec 13, 2019 0525  AST 19  --   --   --   --   ALT 12  --   --   --   --   ALKPHOS 61  --   --   --   --   BILITOT 0.9  --   --   --   --   PROT 6.6  --   --   --   --  ALBUMIN 2.5* 2.3* 2.1* 2.2* 2.1*   No results for input(s): LIPASE, AMYLASE in the last 168 hours. No results for input(s): AMMONIA in the last 168 hours. CBC: Recent Labs  Lab 11/22/2019 1500 12/03/2019 1500 12/05/19 0426 12/06/19 0623 12/06/19 2227 12/07/19 0751 2019/12/11 0525  WBC 23.2*  --  16.5* 9.9  --  11.6* 11.3*  NEUTROABS 18.7*  --   --  6.4  --   --   --   HGB 7.5*   < > 7.4* 6.9* 8.9* 8.9* 8.5*  HCT 26.4*   < > 27.6* 25.1* 32.2* 31.8* 30.5*  MCV 94.6  --  101.5* 96.9  --  95.2 95.9  PLT 357  --  322 333  --  350 352   < > = values in this interval not displayed.   Cardiac Enzymes: Recent Labs  Lab 12/05/19 0426  CKTOTAL 55   Sepsis Labs: Recent Labs  Lab 12/11/2019 1500 12/03/2019 1500 11/20/2019 1623 12/05/19 0426 12/06/19 0623 12/07/19 0751 12/11/2019 0525  PROCALCITON  --   --  0.25  --   --   --   --   WBC 23.2*   < >  --  16.5* 9.9 11.6* 11.3*  LATICACIDVEN 1.3  --  1.1  --   --   --   --    < > = values in this interval not displayed.    Monta Maiorana 2019/12/11, 9:05 PM

## 2019-12-20 NOTE — Progress Notes (Signed)
PROGRESS NOTE   [name]  KCL:[id number] DOB: [birth year] DOA: 11/27/2019 PCP: Dione Housekeeper, MD   Chief Complaint  Patient presents with  . Shortness of Breath    Brief Narrative:  59 y.o. 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, following with wound clinic, patient presents to ED secondary to his weakness and shortness of breath, patient reports he had a fall a week ago when he was trying to get in his car, patient reports usually ambulates with a walker, for short distances, but he has not been able to ambulate since that fall, he has been bedbound since, he lives with his nephew and older brother, patient was able to walk and to drive up until a about a month ago,, patient has chronic lower extremity wound, where he has been following with wound clinic, he was on 10 days of Keflex which she finished couple days ago, patient chronic abdominal wounds has resolved, and he reports his chronic lower extremity wound has been improving, he denies any chest pain, fever, chills, nausea or vomiting, no coffee-ground emesis or melena.  Assessment & Plan:   Active Problems:   CKD (chronic kidney disease) stage 3, GFR 30-59 ml/min   DM type 2 with diabetic dyslipidemia ([facility])   Hypertension associated with diabetes ([place])   Hypothyroidism   Iron deficiency anemia   Morbid obesity with BMI of 70 and over, adult ([facility])   Obstructive sleep apnea syndrome   Infected wound   Non-healing wound of right lower extremity   Open wound of left thigh   Paroxysmal SVT (supraventricular tachycardia) (HCC)   AKI (acute kidney injury) (HCC)   Hyperkalemia   1. Paroxysmal SVT - Pt developed acute pSVT on 4/17, he was given adenosine 6mg , 12mg , 12 mg, 12 mg with no improvement, given lopressor 5 mg  IV x 1 and 15 mins later converted to sinus rhythm.  Obtained serial EKGs on patient during episode and I called and discussed with cardiologist on call. CP symptoms resolved after SVT converted. She recommended for prevention to have him on scheduled beta blocker which is now ordered.  Follow telemetry. She says they would expect a mild bump in troponin from the heart strain from the SVT episode.  He feels better now and symptoms have fully resolved.  Lopressor 5 mg IV q6 h ordered and now on oral metoprolol.   2. Mild superficial soft tissue infection of right leg wound - continue IV antibiotics, added MRSA and pseudomonas coverage, continue wound care.  Surgery has seen.  No need for surgical debridement at this time.  Will need close outpatient follow up at wound care center.  3. Leukocytosis - WBC trending down with treatment.  4. AKI on CKD stage IV - he has hyperkalemia which is being addressed.  Nephrology has seen as well. Renal US ordered.  5. Hyperkalemia - resolved now.   6. Acute on chronic hypoxic respiratory failure - Improving with diuresis.  7. Type 2 DM - resumed home lantus and added some prandial coverage and CBG testing.  8. Anemia of CKD - Hg down to 6.5.  S/p 2 units PRBC.  Hg up to 8 now.  No active bleeding seen.  9. Essential hypertension - BPs have been soft, temporarily holding home meds. Make sure appropriate sized cuff is  used in BP measurements.  10. OSA - CPAP ordered.  11. Hypothyroidism - levothyroxine ordered.  12. Hyperlipidemia - resume home statin.  13. Gait instability - awaiting PT eval, says he needs knee injections to ambulate hasn't had in 3 months, was getting from [facility].  14. Chronic bilateral knee pain - Pt had been receiving monthly knee injections from Dr. Luna Glasgow last injection was Feb 2021.  He now says he can't ambulate due to knee pain. Inpatient ortho consult when available.    15. Right shoulder pain - No fracture seen on xray but suspect  rotator cuff tear, pain management, will see if we can get an ortho consult.   DVT prophylaxis:  Heparin  Code Status: Full  Family Communication: patient updated regarding careplan at bedside, verbalizes understanding  Disposition:   Status is: Inpatient  Remains inpatient appropriate because:Ongoing active pain requiring inpatient pain management, Unsafe d/c plan and IV treatments appropriate due to intensity of illness or inability to take PO  Dispo: The patient is from: Home              Anticipated d/c is to: SNF              Anticipated d/c date is: 2 days              Patient currently is not medically stable to d/c.   Consultants:   Surgery   Procedures:     Antimicrobials:  Cefepime doxycycline  Subjective: Pt c/o overall body pain, joint pain with ambulation attempt with PT.  All joints hurt and body hurts.   Objective: Vitals:   12/07/19 2220 12/07/19 2305 Dec 20, 2019 0204 12/20/19 0829  BP: (!) 147/67  (!) 136/52 105/76  Pulse: 77 75 76 82  Resp: 20 20 (!) 21 20  Temp: 98.3 F (36.8 C)  98.6 F (37 C) 98.4 F (36.9 C)  TempSrc: Oral   Oral  SpO2: 100% 98% 98% 95%  Weight:      Height:        Intake/Output Summary (Last 24 hours) at Dec 20, 2019 1828 Last data filed at 12/20/19 1801 Gross per 24 hour  Intake 1136.95 ml  Output 5400 ml  Net -4263.05 ml   Filed Weights   12/05/19 0015 12/06/19 0500 12/07/19 0500  Weight: (!) 280.3 kg (!) 280.3 kg (!) 281.5 kg    Examination:  General exam: morbidly obese male, lying in bariatric bed, Appears calm and comfortable  Respiratory system: distant sounds, no rales or crackles. Respiratory effort normal. Cardiovascular system: normal S1 & S2 heard. 2+ pedal edema. Gastrointestinal system: Abdomen is morbidly obese, soft and nontender. No organomegaly or masses felt. Normal bowel sounds heard. Central nervous system: Alert and oriented. No focal neurological deficits. Extremities: painful right shoulder  joint, minimal active abduction but can passively abduct with pain, open lesions left leg pannicular fold there is an ulceration does not appear to have surrounding infection, open lesion right leg seems to be slowly healing, foul odor noted.  Dressings clean, no drainage.  Skin: chronic changes from morbid obesity.  Psychiatry: Judgement and insight appear normal. Mood & affect appropriate.   Data Reviewed: I have personally reviewed following labs and imaging studies  CBC: Recent Labs  Lab 11/26/2019 1500 11/26/2019 1500 12/05/19 0426 12/06/19 0623 12/06/19 2227 12/07/19 0751 2019/12/20 0525  WBC 23.2*  --  16.5* 9.9  --  11.6* 11.3*  NEUTROABS 18.7*  --   --  6.4  --   --   --  HGB 7.5*   < > 7.4* 6.9* 8.9* 8.9* 8.5*  HCT 26.4*   < > 27.6* 25.1* 32.2* 31.8* 30.5*  MCV 94.6  --  101.5* 96.9  --  95.2 95.9  PLT 357  --  322 333  --  350 352   < > = values in this interval not displayed.   Basic Metabolic Panel: Recent Labs  Lab 12/05/19 0426 12/05/19 1006 12/06/19 0623 12/07/19 0751 12/16/2019 0525  NA 136 138 139 140 143  K 6.7* 5.8* 5.4* 5.0 5.0  CL 106 105 106 104 105  CO2 22 24 24 27 29   GLUCOSE 156* 150* 113* 112* 85  BUN 58* 59* 61* 62* 65*  CREATININE 3.39* 3.54* 3.01* 2.48* 2.27*  CALCIUM 8.4* 8.6* 8.4* 8.9 9.2  MG  --   --  1.9 1.8 1.6*  PHOS  --  5.2* 4.7* 4.2 3.7    GFR: Estimated Creatinine Clearance: 81.1 mL/min (A) (by C-G formula based on SCr of 2.27 mg/dL (H)).  Liver Function Tests: Recent Labs  Lab 12/06/2019 1500 12/05/19 1006 12/06/19 0623 12/07/19 0751 Dec 16, 2019 0525  AST 19  --   --   --   --   ALT 12  --   --   --   --   ALKPHOS 61  --   --   --   --   BILITOT 0.9  --   --   --   --   PROT 6.6  --   --   --   --   ALBUMIN 2.5* 2.3* 2.1* 2.2* 2.1*    CBG: Recent Labs  Lab 12/07/19 2053 12-16-2019 0252 12-16-2019 0742 12-16-2019 1112 2019/12/16 1755  GLUCAP 110* 115* 79 92 105*     Recent Results (from the past 240 hour(s))  Blood  culture (routine x 2)     Status: None (Preliminary result)   Collection Time: 12/07/2019  3:00 PM   Specimen: Right Antecubital; Blood  Result Value Ref Range Status   Specimen Description RIGHT ANTECUBITAL  Final   Special Requests   Final    BOTTLES DRAWN AEROBIC AND ANAEROBIC Blood Culture adequate volume   Culture   Final    NO GROWTH 4 DAYS Performed at [facility], [address] [place], [place] [address]    Report Status PENDING  Incomplete  SARS CORONAVIRUS 2 (TAT 6-24 HRS) Nasopharyngeal Nasopharyngeal Swab     Status: None   Collection Time: 11/26/2019  3:53 PM   Specimen: Nasopharyngeal Swab  Result Value Ref Range Status   SARS Coronavirus 2 NEGATIVE NEGATIVE Final    Comment: (NOTE) SARS-CoV-2 target nucleic acids are NOT DETECTED. The SARS-CoV-2 RNA is generally detectable in upper and lower respiratory specimens during the acute phase of infection. Negative results do not preclude SARS-CoV-2 infection, do not rule out co-infections with other pathogens, and should not be used as the sole basis for treatment or other patient management decisions. Negative results must be combined with clinical observations, patient history, and epidemiological information. The expected result is Negative. Fact Sheet for Patients: SugarRoll.be Fact Sheet for Healthcare Providers: https://www.woods-mathews.com/ This test is not yet approved or cleared by the [place] FDA and  has been authorized for detection and/or diagnosis of SARS-CoV-2 by FDA under an Emergency Use Authorization (EUA). This EUA will remain  in effect (meaning this test can be used) for the duration of the COVID-19 declaration under Section 56 4(b)(1) of the Act, 21 U.S.C. section  360bbb-3(b)(1), unless the authorization is terminated or revoked sooner. Performed at [facility], [place] [address], [place], [place] [address]   Urine culture     Status: Abnormal    Collection Time: 11/21/2019  4:08 PM   Specimen: In/Out Cath Urine  Result Value Ref Range Status   Specimen Description   Final    IN/OUT CATH URINE Performed at [facility], [address] [place], [place] [address]    Special Requests   Final    NONE Performed at [facility], [address], [place], [place] [address]    Culture MULTIPLE SPECIES PRESENT, SUGGEST RECOLLECTION (A)  Final   Report Status 12/05/2019 FINAL  Final  Blood culture (routine x 2)     Status: None (Preliminary result)   Collection Time: 11/29/2019  4:18 PM   Specimen: BLOOD RIGHT WRIST  Result Value Ref Range Status   Specimen Description BLOOD RIGHT WRIST  Final   Special Requests   Final    BOTTLES DRAWN AEROBIC AND ANAEROBIC Blood Culture adequate volume   Culture   Final    NO GROWTH 4 DAYS Performed at [facility], [address], [place], [place] [address]    Report Status PENDING  Incomplete  Respiratory Panel by RT PCR (Flu A&B, Covid) - Nasopharyngeal Swab     Status: None   Collection Time: 12/10/2019  7:19 PM   Specimen: Nasopharyngeal Swab  Result Value Ref Range Status   SARS Coronavirus 2 by RT PCR NEGATIVE NEGATIVE Final    Comment: (NOTE) SARS-CoV-2 target nucleic acids are NOT DETECTED. The SARS-CoV-2 RNA is generally detectable in upper respiratoy specimens during the acute phase of infection. The lowest concentration of SARS-CoV-2 viral copies this assay can detect is 131 copies/mL. A negative result does not preclude SARS-Cov-2 infection and should not be used as the sole basis for treatment or other patient management decisions. A negative result may occur with  improper specimen collection/handling, submission of specimen other than nasopharyngeal swab, presence of viral mutation(s) within the areas targeted by this assay, and inadequate number of viral copies (<131 copies/mL). A negative result must be combined with clinical observations, patient history, and epidemiological  information. The expected result is Negative. Fact Sheet for Patients:  PinkCheek.be Fact Sheet for Healthcare Providers:  GravelBags.it This test is not yet ap proved or cleared by the [place] FDA and  has been authorized for detection and/or diagnosis of SARS-CoV-2 by FDA under an Emergency Use Authorization (EUA). This EUA will remain  in effect (meaning this test can be used) for the duration of the COVID-19 declaration under Section 564(b)(1) of the Act, 21 U.S.C. section 360bbb-3(b)(1), unless the authorization is terminated or revoked sooner.    Influenza A by PCR NEGATIVE NEGATIVE Final   Influenza B by PCR NEGATIVE NEGATIVE Final    Comment: (NOTE) The Xpert Xpress SARS-CoV-2/FLU/RSV assay is intended as an aid in  the diagnosis of influenza from Nasopharyngeal swab specimens and  should not be used as a sole basis for treatment. Nasal washings and  aspirates are unacceptable for Xpert Xpress SARS-CoV-2/FLU/RSV  testing. Fact Sheet for Patients: PinkCheek.be Fact Sheet for Healthcare Providers: GravelBags.it This test is not yet approved or cleared by the [place] FDA and  has been authorized for detection and/or diagnosis of SARS-CoV-2 by  FDA under an Emergency Use Authorization (EUA). This EUA will remain  in effect (meaning this test can be used) for the duration of the  Covid-19 declaration under Section 564(b)(1) of the Act, 21  U.S.C. section 360bbb-3(b)(1), unless the authorization is  terminated or revoked. Performed at [facility], [address], [place], [place] [address]   MRSA PCR Screening     Status: Abnormal   Collection Time: 11/28/2019 11:53 PM   Specimen: Nasopharyngeal  Result Value Ref Range Status   MRSA by PCR POSITIVE (A) NEGATIVE Final    Comment:        The GeneXpert MRSA Assay (FDA approved for NASAL specimens only),  is one component of a comprehensive MRSA colonization surveillance program. It is not intended to diagnose MRSA infection nor to guide or monitor treatment for MRSA infections. RESULT CALLED TO, READ BACK BY AND VERIFIED WITH: FOLEY,B AT 4650 ON 12/05/19 BY HUFFINES,S Performed at [facility], [address], [place], [place] [address]     Radiology Studies: No results found. Scheduled Meds: . sodium chloride   Intravenous Once  . atorvastatin  10 mg Oral q1800  . buPROPion  300 mg Oral Daily  . Chlorhexidine Gluconate Cloth  6 each Topical Daily  . darbepoetin (ARANESP) injection - NON-DIALYSIS  25 mcg Subcutaneous Q Mon-1800  . docusate sodium  100 mg Oral BID  . feeding supplement (PRO-STAT SUGAR FREE 64)  30 mL Oral TID  . fenofibrate  160 mg Oral Daily  . ferrous sulfate  325 mg Oral BID WC  . gabapentin  300 mg Oral TID  . heparin  5,000 Units Subcutaneous Q8H  . insulin aspart  0-20 Units Subcutaneous TID WC  . insulin aspart  0-5 Units Subcutaneous QHS  . insulin aspart  6 Units Subcutaneous TID WC  . insulin glargine  70 Units Subcutaneous Q2200  . levothyroxine  200 mcg Oral Daily  . metoprolol tartrate  25 mg Oral BID  . multivitamin-lutein  1 capsule Oral Daily  . mupirocin ointment   Nasal BID  . nutrition supplement (JUVEN)  1 packet Oral BID BM  . nystatin   Topical TID  . pantoprazole  40 mg Oral Daily  . Ensure Max Protein  11 oz Oral BID  . sodium zirconium cyclosilicate  10 g Oral Daily  . torsemide  20 mg Oral Daily   Continuous Infusions: . sodium chloride 500 mL (12/07/19 2054)  . ceFEPime (MAXIPIME) IV 2 g (December 13, 2019 1025)  . doxycycline (VIBRAMYCIN) IV 100 mg (12/13/2019 1134)    LOS: 4 days    Irwin Brakeman, MD [facility]  To contact the attending provider between 7A-7P or the covering provider during after hours 7P-7A, please log into the web site [email or web address] and access using universal  password for that web site. If  you do not have the password, please call the hospital operator.  2019/12/13, 6:28 PM

## 2019-12-20 NOTE — Care Management Important Message (Signed)
Important Message  Patient Details  Name: [name] MRN: [id number] Date of Birth: [birth year]   Medicare Important Message Given:  Yes     Tommy Medal 12-18-2019, 4:13 PM

## 2019-12-20 NOTE — ED Provider Notes (Signed)
[facility]  Department of Emergency Medicine   Code Blue CONSULT NOTE  Chief Complaint: Cardiac arrest/unresponsive   Level V Caveat: Unresponsive  History of present illness: I was contacted by the hospital for a CODE BLUE cardiac arrest upstairs and presented to the patient's bedside.   Morbidly obese patient, on third floor, called to the bedside due to V. fib arrest The patient is unable to give me any information, completely unresponsive, CPR in progress on my arrival to the room  ROS: Unable to obtain, Level V caveat  Scheduled Meds: . sodium chloride   Intravenous Once  . atorvastatin  10 mg Oral q1800  . buPROPion  300 mg Oral Daily  . Chlorhexidine Gluconate Cloth  6 each Topical Daily  . darbepoetin (ARANESP) injection - NON-DIALYSIS  25 mcg Subcutaneous Q Mon-1800  . docusate sodium  100 mg Oral BID  . feeding supplement (PRO-STAT SUGAR FREE 64)  30 mL Oral TID  . fenofibrate  160 mg Oral Daily  . ferrous sulfate  325 mg Oral BID WC  . gabapentin  300 mg Oral TID  . heparin  5,000 Units Subcutaneous Q8H  . insulin aspart  0-20 Units Subcutaneous TID WC  . insulin aspart  0-5 Units Subcutaneous QHS  . insulin aspart  6 Units Subcutaneous TID WC  . insulin glargine  70 Units Subcutaneous Q2200  . labetalol  10 mg Intravenous Once  . levothyroxine  200 mcg Oral Daily  . metoprolol tartrate  50 mg Oral BID  . multivitamin-lutein  1 capsule Oral Daily  . mupirocin ointment   Nasal BID  . nutrition supplement (JUVEN)  1 packet Oral BID BM  . nystatin   Topical TID  . pantoprazole  40 mg Oral Daily  . Ensure Max Protein  11 oz Oral BID  . sodium zirconium cyclosilicate  10 g Oral Daily  . torsemide  20 mg Oral Daily   Continuous Infusions: . sodium chloride 500 mL (12/07/19 2054)  . amiodarone    . amiodarone    . ceFEPime (MAXIPIME) IV 2 g (12/09/2019 1025)  . doxycycline (VIBRAMYCIN) IV 100 mg (2019/12/09 1134)  . magnesium sulfate bolus IVPB    .  norepinephrine (LEVOPHED) Adult infusion     PRN Meds:.sodium chloride, acetaminophen **OR** acetaminophen, albuterol, bisacodyl, diazepam, diclofenac Sodium, hydrALAZINE, magnesium citrate, Muscle Rub, oxyCODONE Past Medical History:  Diagnosis Date  . Cellulitis   . Diabetes mellitus without complication ([place])   . Hypertension   . On home oxygen therapy    Past Surgical History:  Procedure Laterality Date  . CORONARY ANGIOPLASTY     Social History   Socioeconomic History  . Marital status: Single    Spouse name: Not on file  . Number of children: Not on file  . Years of education: Not on file  . Highest education level: Not on file  Occupational History  . Not on file  Tobacco Use  . Smoking status: Never Smoker  . Smokeless tobacco: Never Used  Substance and Sexual Activity  . Alcohol use: No    Alcohol/week: 0.0 standard drinks  . Drug use: No  . Sexual activity: Not on file  Other Topics Concern  . Not on file  Social History Narrative   Live with brother and nephew   Social Determinants of Health   Financial Resource Strain:   . Difficulty of Paying Living Expenses:   Food Insecurity:   . Worried About [employer] of  Food in the Last Year:   . [place] in the Last Year:   Transportation Needs:   . Film/video editor (Medical):   Marland Kitchen Lack of Transportation (Non-Medical):   Physical Activity:   . Days of Exercise per Week:   . Minutes of Exercise per Session:   Stress:   . Feeling of Stress :   Social Connections:   . Frequency of Communication with Friends and Family:   . Frequency of Social Gatherings with Friends and Family:   . Attends Religious Services:   . Active Member of Clubs or Organizations:   . Attends Archivist Meetings:   Marland Kitchen Marital Status:   Intimate Partner Violence:   . Fear of Current or Ex-Partner:   . Emotionally Abused:   Marland Kitchen Physically Abused:   . Sexually Abused:    No Known Allergies  Last set of Vital  Signs (not current) Vitals:   12-22-2019 1500 Dec 22, 2019 1842  BP:  (!) 137/97  Pulse:  (!) 134  Resp:    Temp:    SpO2: 97% 99%      Physical Exam Obtunded Gen: unresponsive Cardiovascular: pulseless  Resp: apneic. Breath sounds equal bilaterally with bagging  Abd: nondistended  Neuro: GCS 3, unresponsive to pain  HEENT: No blood in posterior pharynx, gag reflex absent  Neck: No crepitus  Musculoskeletal: No deformity  Skin: warm  Procedures  INTUBATION Performed by: Johnna Acosta Required items: required blood products, implants, devices, and special equipment available Patient identity confirmed: provided demographic data and hospital-assigned identification number Time out: Immediately prior to procedure a "time out" was called to verify the correct patient, procedure, equipment, support staff and site/side marked as required. Indications: Apnea Intubation method: Direct laryngoscopy Preoxygenation: BVM Sedatives: None Paralytic: None Tube Size: 7.5 cuffed Post-procedure assessment: chest rise and ETCO2 monitor Breath sounds: equal and absent over the epigastrium Tube secured by Respiratory Therapy Patient tolerated the procedure well with no immediate complications.  CRITICAL CARE Performed by: Johnna Acosta Total critical care time: 62 Critical care time was exclusive of separately billable procedures and treating other patients. Critical care was necessary to treat or prevent imminent or life-threatening deterioration. Critical care was time spent personally by me on the following activities: development of treatment plan with patient and/or surrogate as well as nursing, discussions with consultants, evaluation of patient's response to treatment, examination of patient, obtaining history from patient or surrogate, ordering and performing treatments and interventions, ordering and review of laboratory studies, ordering and review of radiographic studies, pulse  oximetry and re-evaluation of patient's condition.  Cardiopulmonary Resuscitation (CPR) Procedure Note  Directed/Performed by: Johnna Acosta I personally directed ancillary staff and/or performed CPR in an effort to regain return of spontaneous circulation and to maintain cardiac, neuro and systemic perfusion.    Medical Decision making  This patient had multiple episodes of ventricular fibrillation, he had short episodes of awareness after being cardioverted and defibrillated however he had no perfusing rhythm at all for an ongoing time of more than 35 minutes.  I transition care over to the hospitalist who participated and directed the resuscitative efforts  Assessment and Plan  Poor outcome suspected    Noemi Chapel, MD 22-Dec-2019 2337

## 2019-12-20 NOTE — Progress Notes (Signed)
  Amiodarone Drug - Drug Interaction Consult Note  Recommendations: See checked boxes below for risks. Monitor K+ closely.  Amiodarone is metabolized by the cytochrome P450 system and therefore has the potential to cause many drug interactions. Amiodarone has an average plasma half-life of 50 days (range 20 to 100 days).   There is potential for drug interactions to occur several weeks or months after stopping treatment and the onset of drug interactions may be slow after initiating amiodarone.   []  Statins: Increased risk of myopathy. Simvastatin- restrict dose to 20mg  daily. Pt ok on Atorvastatin. Other statins: counsel patients to report any muscle pain or weakness immediately.  []  Anticoagulants: Amiodarone can increase anticoagulant effect. Consider warfarin dose reduction. Patients should be monitored closely and the dose of anticoagulant altered accordingly, remembering that amiodarone levels take several weeks to stabilize.  []  Antiepileptics: Amiodarone can increase plasma concentration of phenytoin, the dose should be reduced. Note that small changes in phenytoin dose can result in large changes in levels. Monitor patient and counsel on signs of toxicity.  [x]  Beta blockers: increased risk of bradycardia, AV block and myocardial depression. Sotalol - avoid concomitant use.  [x]   Calcium channel blockers (diltiazem and verapamil): increased risk of bradycardia, AV block and myocardial depression.  - Pt on  Amlodipine PTA  []   Cyclosporine: Amiodarone increases levels of cyclosporine. Reduced dose of cyclosporine is recommended.  []  Digoxin dose should be halved when amiodarone is started.  [x]  Diuretics: increased risk of cardiotoxicity if hypokalemia occurs.  - HCTZ, Torsemide PTA  []  Oral hypoglycemic agents (glyburide, glipizide, glimepiride): increased risk of hypoglycemia. Patient's glucose levels should be monitored closely when initiating amiodarone therapy.   []  Drugs  that prolong the QT interval:  Torsades de pointes risk may be increased with concurrent use - avoid if possible.  Monitor QTc, also keep magnesium/potassium WNL if concurrent therapy can't be avoided. Marland Kitchen Antibiotics: e.g. fluoroquinolones, erythromycin. . Antiarrhythmics: e.g. quinidine, procainamide, disopyramide, sotalol. . Antipsychotics: e.g. phenothiazines, haloperidol.  . Lithium, tricyclic antidepressants, and methadone. Thank You,   Lionardo Haze S. Alford Highland, PharmD, BCPS Clinical Staff Pharmacist [email or web address] [facility]  2019-12-17 7:31 PM

## 2019-12-20 NOTE — Progress Notes (Addendum)
Patient ID: [name], male   DOB: 1961-02-01, 59 y.o.   MRN: [id number] S: Out of the ICU and feeling better O:BP 105/76 (BP Location: Right Arm)   Pulse 82   Temp 98.4 F (36.9 C) (Oral)   Resp 20   Ht 5\' 11"  (1.803 m)   Wt (!) 281.5 kg   SpO2 95%   BMI 86.56 kg/m   Intake/Output Summary (Last 24 hours) at Dec 19, 2019 0837 Last data filed at 19-Dec-2019 0629 Gross per 24 hour  Intake 2548.3 ml  Output 4700 ml  Net -2151.7 ml   Intake/Output: I/O last 3 completed shifts: In: 2963.3 [P.O.:1800; I.V.:192.5; Blood:315; IV Piggyback:655.8] Out: 4332 [Urine:6425]  Intake/Output this shift:  No intake/output data recorded. Weight change:  Gen: morbidly obese WM lying in bed in NAD CVS: faint HS, no rub Resp: diminished BS Abd: obese, +BS, soft, some lower abd edema Ext: 2+ pitting edema, right leg in wrappings.  Recent Labs  Lab 12/07/2019 1500 12/19/2019 2150 12/05/19 0426 12/05/19 1006 12/06/19 0623 12/07/19 0751 2019-12-19 0525  NA 134* 135 136 138 139 140 143  K 6.3* 5.8* 6.7* 5.8* 5.4* 5.0 5.0  CL 103 104 106 105 106 104 105  CO2 21* 23 22 24 24 27 29   GLUCOSE 203* 160* 156* 150* 113* 112* 85  BUN 59* 57* 58* 59* 61* 62* 65*  CREATININE 3.30* 3.32* 3.39* 3.54* 3.01* 2.48* 2.27*  ALBUMIN 2.5*  --   --  2.3* 2.1* 2.2* 2.1*  CALCIUM 8.4* 8.4* 8.4* 8.6* 8.4* 8.9 9.2  PHOS  --   --   --  5.2* 4.7* 4.2 3.7  AST 19  --   --   --   --   --   --   ALT 12  --   --   --   --   --   --    Liver Function Tests: Recent Labs  Lab 12/06/2019 1500 12/05/19 1006 12/06/19 0623 12/07/19 0751 Dec 19, 2019 0525  AST 19  --   --   --   --   ALT 12  --   --   --   --   ALKPHOS 61  --   --   --   --   BILITOT 0.9  --   --   --   --   PROT 6.6  --   --   --   --   ALBUMIN 2.5*   < > 2.1* 2.2* 2.1*   < > = values in this interval not displayed.   No results for input(s): LIPASE, AMYLASE in the last 168 hours. No results for input(s): AMMONIA in the last 168 hours. CBC: Recent Labs   Lab 11/27/2019 1500 12/07/2019 1500 12/05/19 0426 12/05/19 0426 12/06/19 0623 12/06/19 0623 12/06/19 2227 12/07/19 0751 12-19-2019 0525  WBC 23.2*   < > 16.5*   < > 9.9  --   --  11.6* 11.3*  NEUTROABS 18.7*  --   --   --  6.4  --   --   --   --   HGB 7.5*   < > 7.4*   < > 6.9*   < > 8.9* 8.9* 8.5*  HCT 26.4*   < > 27.6*   < > 25.1*   < > 32.2* 31.8* 30.5*  MCV 94.6  --  101.5*  --  96.9  --   --  95.2 95.9  PLT 357   < > 322   < >  333  --   --  350 352   < > = values in this interval not displayed.   Cardiac Enzymes: Recent Labs  Lab 12/05/19 0426  CKTOTAL 55   CBG: Recent Labs  Lab 12/07/19 1157 12/07/19 1647 12/07/19 2053 01/01/20 0252 01-Jan-2020 0742  GLUCAP 119* 123* 110* 115* 79    Iron Studies: No results for input(s): IRON, TIBC, TRANSFERRIN, FERRITIN in the last 72 hours. Studies/Results: No results found. . sodium chloride   Intravenous Once  . atorvastatin  10 mg Oral q1800  . buPROPion  300 mg Oral Daily  . Chlorhexidine Gluconate Cloth  6 each Topical Daily  . docusate sodium  100 mg Oral BID  . feeding supplement (PRO-STAT SUGAR FREE 64)  30 mL Oral TID  . fenofibrate  160 mg Oral Daily  . ferrous sulfate  325 mg Oral BID WC  . gabapentin  300 mg Oral TID  . heparin  5,000 Units Subcutaneous Q8H  . insulin aspart  0-20 Units Subcutaneous TID WC  . insulin aspart  0-5 Units Subcutaneous QHS  . insulin aspart  6 Units Subcutaneous TID WC  . insulin glargine  70 Units Subcutaneous Q2200  . levothyroxine  200 mcg Oral Daily  . metoprolol tartrate  25 mg Oral BID  . multivitamin-lutein  1 capsule Oral Daily  . mupirocin ointment   Nasal BID  . nutrition supplement (JUVEN)  1 packet Oral BID BM  . pantoprazole  40 mg Oral Daily  . Ensure Max Protein  11 oz Oral BID  . sodium zirconium cyclosilicate  10 g Oral Daily  . torsemide  20 mg Oral Daily    BMET    Component Value Date/Time   NA 143 01/01/20 0525   K 5.0 01/01/20 0525   CL 105  2020-01-01 0525   CO2 29 Jan 01, 2020 0525   GLUCOSE 85 2020/01/01 0525   BUN 65 (H) 2020/01/01 0525   CREATININE 2.27 (H) 01-01-20 0525   CALCIUM 9.2 01-01-2020 0525   GFRNONAA 31 (L) 2020/01/01 0525   GFRAA 36 (L) 01/01/2020 0525   CBC    Component Value Date/Time   WBC 11.3 (H) 01/01/20 0525   RBC 3.18 (L) 01/01/2020 0525   HGB 8.5 (L) 01-Jan-2020 0525   HCT 30.5 (L) 01-01-2020 0525   PLT 352 01/01/20 0525   MCV 95.9 2020-01-01 0525   MCH 26.7 01-01-20 0525   MCHC 27.9 (L) 01-01-20 0525   RDW 16.4 (H) 01/01/20 0525   LYMPHSABS 1.3 12/06/2019 0623   MONOABS 1.3 (H) 12/06/2019 0623   EOSABS 0.7 (H) 12/06/2019 0623   BASOSABS 0.1 12/06/2019 0623     Assessment/Plan:  1. Non-oliguric AKI/CKD stage 3b (baseline Scr was 2-2.1 in November 2020) in setting of septic shock, diuretics, anemia, and concomitant ARB therapy.   Scr has been improving after stopping losartan despite vigorous diuresis. 1. Creatinine continues to improve 2. Continue to hold ARB 2. Septic shock- due to cellulitis/panniculitis of lower extremity ulcers.  On broad spectrum antibiotics per primary svc. 1. BP and WBC improving. 3. Hyperkalemia- improving with diuresis and lokelma.  Continue to follow.  4. CKD stage 3b presumably due to combination of DM, HTN, and diastolic CHF (grade I diastolic dysfunction by ECHO 05/12/10 but not repeated since).  Normally follows [employer]. 5. Acute on chronic anemia of CKD stage 3b- agree with blood transfusion.   1. Low iron stores and will benefit from ESA therapy as  well.  2. IV feraheme and follow. 6. SVT- likely due to infection and anemia.  Rate improved.  7. Pulmonary edema- h/o diastolic CHF.   1. Recommend repeat ECHO as one has not been done in 10 years.  He may now have combined systolic and diastolic CHF. 2. Brisk diuresis after 30 mg IV lasix and continue with 20 mg IV daily for now. 8. HTN- off meds for now. 9. OSA/OHS-  on Cpap 10. Super morbid obesity 11. DM- per primary   Donetta Potts, MD [facility] [phone]

## 2019-12-20 NOTE — Evaluation (Signed)
Physical Therapy Evaluation Patient Details Name: [name] MRN: [id number] DOB: [birth year] Today's Date: 2019-12-12   History of Present Illness  [name]  is a 59 y.o. 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, following with wound clinic, patient presents to ED secondary to his weakness and shortness of breath, patient reports he had a fall a week ago when he was trying to get in his car, patient reports usually ambulates with a walker, for short distances, but he has not been able to ambulate since that fall, he has been bedbound since, he lives with his nephew and older brother, patient was able to walk and to drive up until a about a month ago,, patient has chronic lower extremity wound, where he has been following with wound clinic, he was on 10 days of Keflex which she finished couple days ago, patient chronic abdominal wounds has resolved, and he reports his chronic lower extremity wound has been improving, he denies any chest pain, fever, chills, nausea or vomiting, no coffee-ground emesis or melena.    Clinical Impression  Patient has difficulty using RUE for bed mobility due to c/o increased pain with limited ROM, required 2 person assist pushing from the back to sit up at bedside due to weakness, unable to attempt sit to stand due to BLE weakness and fatiguing with SOB, SpO2 at 90-93% while on 4 LPM O2.  Patient required 2 person assistance to lift legs during sit to supine.  Patient will benefit from continued physical therapy in hospital and recommended venue below to increase strength, balance, endurance for safe ADLs and gait.    Follow Up Recommendations SNF;Supervision for mobility/OOB;Supervision - Intermittent    Equipment Recommendations  3in1 (PT);Hospital  bed;Wheelchair (measurements PT);Wheelchair cushion (measurements PT)    Recommendations for Other Services       Precautions / Restrictions Precautions Precautions: Fall Restrictions Weight Bearing Restrictions: No      Mobility  Bed Mobility Overal bed mobility: Needs Assistance Bed Mobility: Supine to Sit;Sit to Supine     Supine to sit: Max assist;+2 for physical assistance Sit to supine: Max assist;+2 for physical assistance   General bed mobility comments: increased time, labored movement, diffiuclty moving legs due to body habitus and weakness  Transfers                    Ambulation/Gait                Stairs            Wheelchair Mobility    Modified Rankin (Stroke Patients Only)       Balance Overall balance assessment: Needs assistance Sitting-balance support: Feet supported;Bilateral upper extremity supported Sitting balance-Leahy Scale: Fair Sitting balance - Comments: seated at EOB                                     Pertinent Vitals/Pain Pain Assessment: Faces Faces Pain Scale: Hurts little more Pain Location: bilateral knees, left worse than right, RUE Pain Descriptors / Indicators: Sore;Guarding;Grimacing Pain Intervention(s): Limited activity within patient's tolerance;Monitored during session;Repositioned    Home Living Family/patient expects to be discharged to:: Private residence Living Arrangements: Other relatives Available Help at Discharge: Family;Available 24 hours/day  Type of Home: House Home Access: Stairs to enter Entrance Stairs-Rails: None Entrance Stairs-Number of Steps: 1 Home Layout: One level Home Equipment: Environmental consultant - 4 wheels      Prior Function Level of Independence: Needs assistance   Gait / Transfers Assistance Needed: household and short distanced Electronics engineer  ADL's / Homemaking Assistance Needed: assisted by family        Hand Dominance         Extremity/Trunk Assessment   Upper Extremity Assessment Upper Extremity Assessment: Defer to OT evaluation    Lower Extremity Assessment Lower Extremity Assessment: Generalized weakness;RLE deficits/detail;LLE deficits/detail RLE Deficits / Details: grossly -4/5 RLE Sensation: WNL RLE Coordination: WNL LLE Deficits / Details: grossly 3+/5 LLE: Unable to fully assess due to pain LLE Sensation: WNL LLE Coordination: WNL    Cervical / Trunk Assessment Cervical / Trunk Assessment: Normal  Communication   Communication: No difficulties  Cognition Arousal/Alertness: Awake/alert Behavior During Therapy: WFL for tasks assessed/performed Overall Cognitive Status: Within Functional Limits for tasks assessed                                        General Comments      Exercises     Assessment/Plan    PT Assessment Patient needs continued PT services  PT Problem List Decreased strength;Decreased activity tolerance;Decreased balance;Decreased mobility       PT Treatment Interventions Balance training;Gait training;Functional mobility training;Therapeutic activities;Therapeutic exercise;Stair training;Patient/family education;Wheelchair mobility training    PT Goals (Current goals can be found in the Care Plan section)  Acute Rehab PT Goals Patient Stated Goal: return home with family to assist PT Goal Formulation: With patient Time For Goal Achievement: 12/22/19 Potential to Achieve Goals: Fair    Frequency Min 3X/week   Barriers to discharge        Co-evaluation               AM-PAC PT "6 Clicks" Mobility  Outcome Measure Help needed turning from your back to your side while in a flat bed without using bedrails?: A Lot Help needed moving from lying on your back to sitting on the side of a flat bed without using bedrails?: A Lot Help needed moving to and from a bed to a chair (including a wheelchair)?: Total Help needed standing up from a chair  using your arms (e.g., wheelchair or bedside chair)?: Total Help needed to walk in hospital room?: Total Help needed climbing 3-5 steps with a railing? : Total 6 Click Score: 8    End of Session Equipment Utilized During Treatment: Oxygen Activity Tolerance: Patient tolerated treatment well;Patient limited by fatigue Patient left: in bed;with call bell/phone within reach;with nursing/sitter in room Nurse Communication: Mobility status PT Visit Diagnosis: Unsteadiness on feet (R26.81);Other abnormalities of gait and mobility (R26.89);Muscle weakness (generalized) (M62.81)    Time: 0812-0903 PT Time Calculation (min) (ACUTE ONLY): 51 min   Charges:   PT Evaluation $PT Eval High Complexity: 1 High PT Treatments $Therapeutic Activity: 38-52 mins        10:57 AM, 12/12/19 Lonell Grandchild, MPT Physical Therapist with [facility] [phone] office [phone] mobile phone

## 2019-12-20 NOTE — Progress Notes (Signed)
Telemetry called this RN to notify of increased HR. Patient stated "head hurt down to his chest". Vital signs taken. Dr. Wynetta Emery notified of Afib. New orders given.   This RN called pharmacy to verify new medication orders. Nephew walked into room, spoke to patient and then patient became unresponsive. Code button called at [phone].   Rapid response team arrived and CPR started.   Family informed. Time of death was 61.  [place] donor services called.

## 2019-12-20 DEATH — deceased

## 2019-12-26 ENCOUNTER — Encounter (HOSPITAL_BASED_OUTPATIENT_CLINIC_OR_DEPARTMENT_OTHER): Payer: Medicare HMO | Admitting: Internal Medicine

## 2021-09-02 IMAGING — DX DG SHOULDER 2+V PORT*R*
2 series · 2 of 2 positions shown · non-contrast
Comparison: None.

CLINICAL DATA: Fall last week with persistent right shoulder pain.

EXAM:
PORTABLE RIGHT SHOULDER

[shoulder ap (1 of 2)]
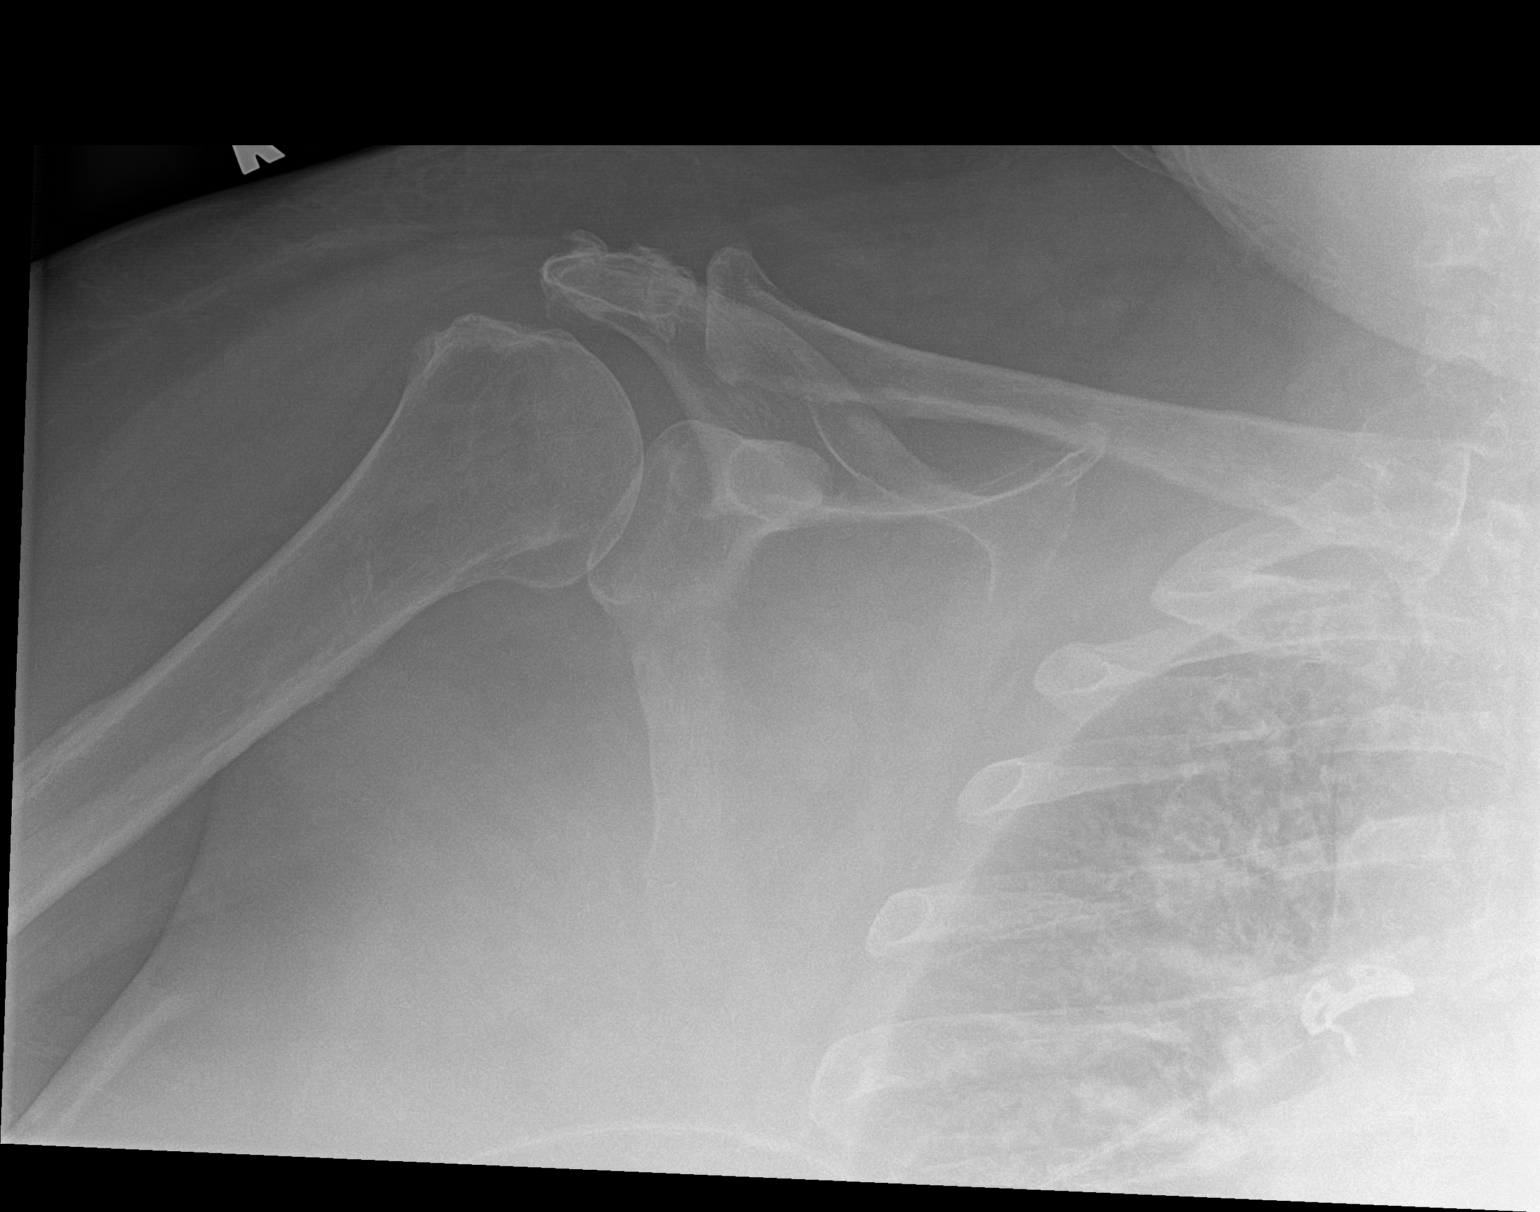

[shoulder ap (2 of 2)]
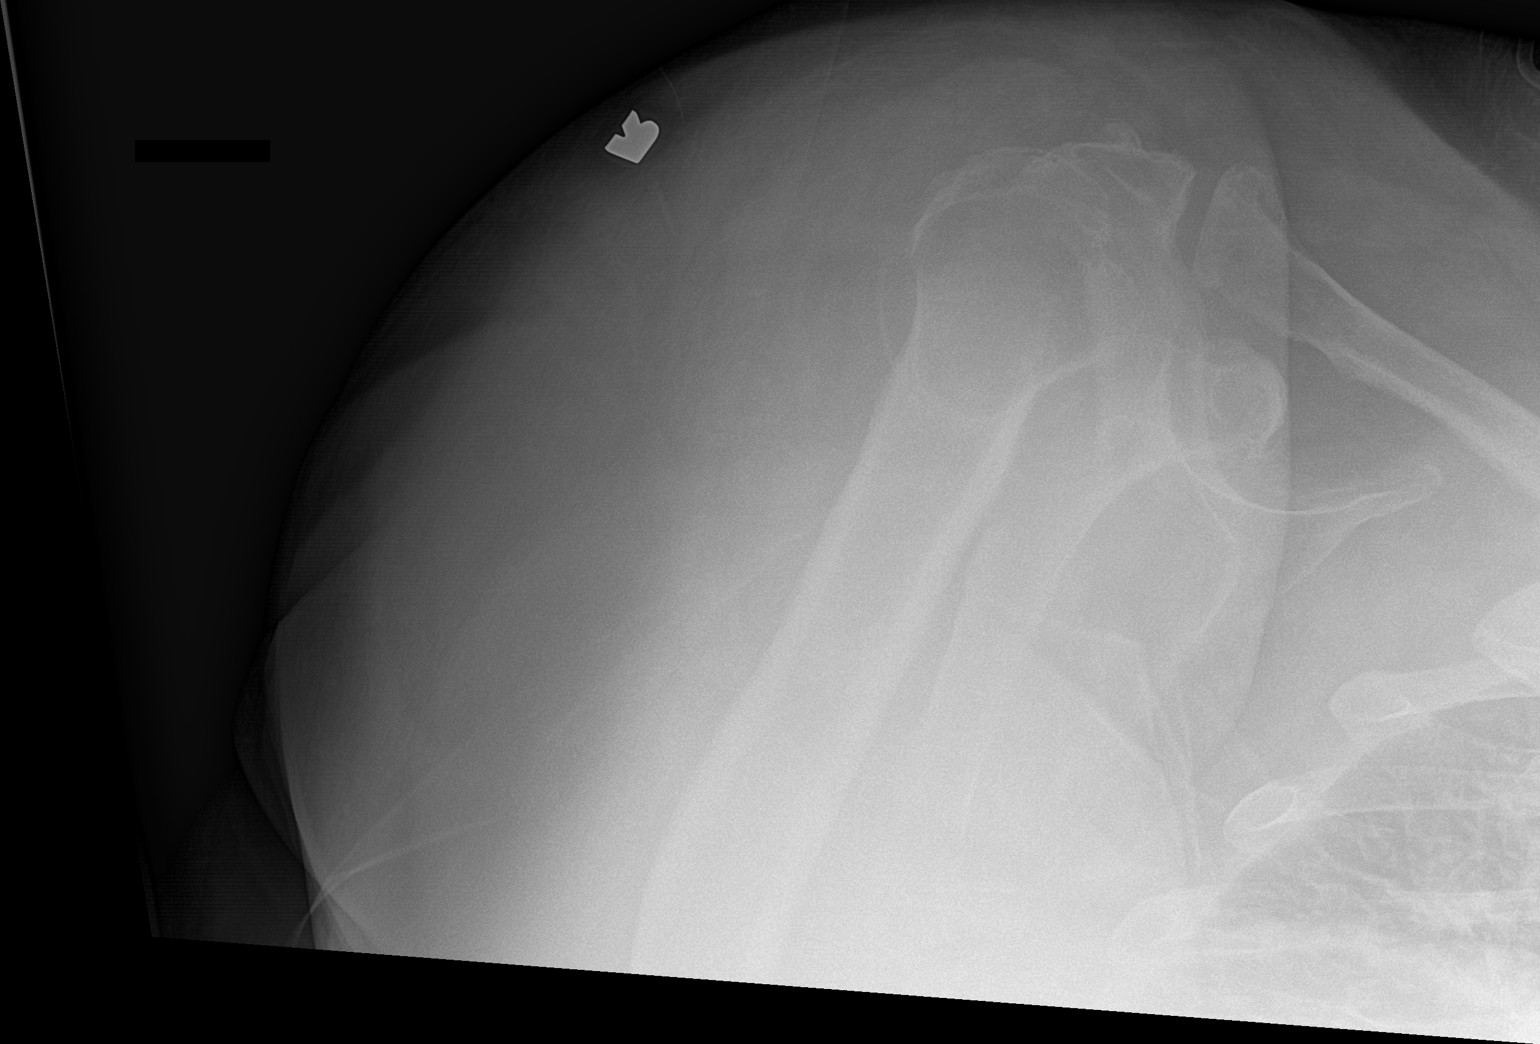

[2 of 2 positions shown; findings below may reference images not displayed]

FINDINGS: Mild degenerative change of the AC joint. No acute fracture or
dislocation.
IMPRESSION: No acute findings.
